# Patient Record
Sex: Female | Born: 1990 | Race: Black or African American | Hispanic: No | Marital: Single | Smoking: Never smoker
Health system: Southern US, Community
[De-identification: ages and names within clinical notes are randomized; demographics above are authoritative.]

## PROBLEM LIST (undated history)

## (undated) DIAGNOSIS — G800 Spastic quadriplegic cerebral palsy: Secondary | ICD-10-CM

## (undated) DIAGNOSIS — M24561 Contracture, right knee: Secondary | ICD-10-CM

## (undated) DIAGNOSIS — M24562 Contracture, left knee: Secondary | ICD-10-CM

## (undated) DIAGNOSIS — Z982 Presence of cerebrospinal fluid drainage device: Secondary | ICD-10-CM

## (undated) DIAGNOSIS — F819 Developmental disorder of scholastic skills, unspecified: Secondary | ICD-10-CM

## (undated) DIAGNOSIS — Q743 Arthrogryposis multiplex congenita: Secondary | ICD-10-CM

## (undated) DIAGNOSIS — R569 Unspecified convulsions: Secondary | ICD-10-CM

## (undated) DIAGNOSIS — Q751 Craniofacial dysostosis: Secondary | ICD-10-CM

## (undated) HISTORY — DX: Craniofacial dysostosis: Q75.1

## (undated) HISTORY — DX: Contracture, right knee: M24.561

## (undated) HISTORY — DX: Developmental disorder of scholastic skills, unspecified: F81.9

## (undated) HISTORY — DX: Unspecified convulsions: R56.9

## (undated) HISTORY — PX: GASTROSTOMY TUBE PLACEMENT: SHX655

## (undated) HISTORY — DX: Contracture, right knee: M24.562

## (undated) HISTORY — DX: Arthrogryposis multiplex congenita: Q74.3

## (undated) HISTORY — PX: ELBOW SURGERY: SHX618

## (undated) HISTORY — DX: Spastic quadriplegic cerebral palsy: G80.0

## (undated) HISTORY — PX: HIP SURGERY: SHX245

## (undated) HISTORY — DX: Presence of cerebrospinal fluid drainage device: Z98.2

---
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
## 2012-05-25 DIAGNOSIS — M4140 Neuromuscular scoliosis, site unspecified: Secondary | ICD-10-CM | POA: Insufficient documentation

## 2012-05-25 DIAGNOSIS — M419 Scoliosis, unspecified: Secondary | ICD-10-CM | POA: Insufficient documentation

## 2012-05-25 DIAGNOSIS — F79 Unspecified intellectual disabilities: Secondary | ICD-10-CM | POA: Insufficient documentation

## 2013-08-26 ENCOUNTER — Other Ambulatory Visit: Payer: Self-pay | Admitting: Family

## 2013-10-25 ENCOUNTER — Other Ambulatory Visit: Payer: Self-pay | Admitting: Family

## 2013-12-22 ENCOUNTER — Other Ambulatory Visit: Payer: Self-pay | Admitting: Family

## 2013-12-27 ENCOUNTER — Ambulatory Visit (INDEPENDENT_AMBULATORY_CARE_PROVIDER_SITE_OTHER): Payer: Medicaid Other | Admitting: Family

## 2013-12-27 ENCOUNTER — Encounter: Payer: Self-pay | Admitting: Family

## 2013-12-27 VITALS — BP 120/70 | HR 80

## 2013-12-27 DIAGNOSIS — Z79899 Other long term (current) drug therapy: Secondary | ICD-10-CM

## 2013-12-27 DIAGNOSIS — F819 Developmental disorder of scholastic skills, unspecified: Secondary | ICD-10-CM

## 2013-12-27 DIAGNOSIS — G40309 Generalized idiopathic epilepsy and epileptic syndromes, not intractable, without status epilepticus: Secondary | ICD-10-CM

## 2013-12-27 DIAGNOSIS — Q759 Congenital malformation of skull and face bones, unspecified: Secondary | ICD-10-CM

## 2013-12-27 DIAGNOSIS — Q688 Other specified congenital musculoskeletal deformities: Secondary | ICD-10-CM

## 2013-12-27 DIAGNOSIS — Q751 Craniofacial dysostosis: Secondary | ICD-10-CM

## 2013-12-27 DIAGNOSIS — M24569 Contracture, unspecified knee: Secondary | ICD-10-CM

## 2013-12-27 DIAGNOSIS — M24562 Contracture, left knee: Secondary | ICD-10-CM

## 2013-12-27 DIAGNOSIS — G808 Other cerebral palsy: Secondary | ICD-10-CM

## 2013-12-27 DIAGNOSIS — Z982 Presence of cerebrospinal fluid drainage device: Secondary | ICD-10-CM

## 2013-12-27 DIAGNOSIS — M24561 Contracture, right knee: Secondary | ICD-10-CM

## 2013-12-27 DIAGNOSIS — G800 Spastic quadriplegic cerebral palsy: Secondary | ICD-10-CM

## 2013-12-27 DIAGNOSIS — Q743 Arthrogryposis multiplex congenita: Secondary | ICD-10-CM

## 2013-12-27 DIAGNOSIS — R6250 Unspecified lack of expected normal physiological development in childhood: Secondary | ICD-10-CM

## 2013-12-27 NOTE — Progress Notes (Signed)
Patient: Alyssa Wong MRN: [id number] Sex: female DOB: Oct 08, 1991  Provider: [name], NP Location of Care: [facility]  Note type: Routine return visit  History of Present Illness: Referral Source: Dr. Adelfa Koh History from: Mother Chief Complaint: Seizures  Alyssa Wong is a 23 y.o. 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  Alyssa Wong was last seen December 25, 2012. Her mother reports that she has been healthy since last seen. She has had only rare brief seizures. Alyssa Wong is enrolled in [employer] 3 days per week and attends the [facility] [employer] 2 days per week. Her mother says that she does well at these programs and enjoys the activities.   Review of Systems: 12 system review was unremarkable  Past Medical History  Diagnosis Date  . Seizures   . Crouzon syndrome   . 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 Intellectual delay   . VP (ventriculoperitoneal) shunt status   . Arthrogryposis multiplex congenita   . Contractures involving both knees    Hospitalizations: yes, Head Injury: no, Nervous System Infections: no, Immunizations up to date: yes Past Medical History Comments:  Hospitalizations as a young child  Surgical History Past Surgical History  Procedure Laterality Date  . Elbow surgery Left     Release of contractures  . Gastrostomy tube placement    . Hip surgery      Family History family history includes Kidney disease in her maternal grandfather. Family History is negative migraines, seizures, cognitive impairment, blindness, deafness, birth defects, chromosomal disorder, autism.  Social History History   Social History  . Marital Status: Single    Spouse Name: N/A    Number of  Children: N/A  . Years of Education: N/A   Social History Main Topics  . Smoking status: Never Smoker   . Smokeless tobacco: Never Used  . Alcohol Use: No  . Drug Use: No  . Sexual Activity: No   Other Topics Concern  . None   Social History Narrative  . None   Educational level: special education Occupation: N/A   Living with mother and brother  Hobbies/Interest: Enjoys her tambourine and anything that makes noise. She has a Armed forces logistics/support/administrative officer that she has had since she was 23 years old and she still enjoys playing with it. School comments: Jianna graduated from [employer] in June 2013. She now attends [employer] 3 days a week and an adult daycare 2 days a week.   Current Outpatient Prescriptions on File Prior to Visit  Medication Sig Dispense Refill  . LAMICTAL 25 MG CHEW chewable tablet CHEW 1 TABLET BY MOUTH EVERY MORNING AND 2 TABLETS EVERY NIGHT AT BEDTIME.  90 tablet  0  . Valproic Acid (DEPAKENE) 250 MG/5ML SYRP syrup TAKE 1 TEASPOONFUL BY MOUTH TWICE DAILY  310 mL  0   No current facility-administered medications on file prior to visit.   The medication list was reviewed and reconciled. All changes or newly prescribed medications were explained.  A complete medication list was provided to the patient/caregiver.  No Known Allergies  Physical Exam BP 120/70  Pulse 80 General: Small statured young woman, seated in a wheelchair, in no evident distress Head: head  normocephalic and atraumatic.  She has dysmorphic facial features with exophthalmos, ocular hypertelorism, midface hypoplasia  Ears, Nose and Throat: I am unable to fully visualize her pharynx due to her inability to follow commands.  Neck: supple with no carotid or supraclavicular bruits.  She tends to keep her neck hyperextended and is unable to hold her head midline.  Respiratory: lungs clear to auscultation Cardiovascular: regular rate and rhythm, no murmurs Musculoskeletal:  quadriparesis with athrogryposis in the right upper extremities Skin: no rashes or lesions Trunk: she has g-tube button  Neurologic Exam  Mental Status: Awake and fully alert.  She has no language. She has evidence of intellectual delay. She is unable to follow commands. She was resistant to invasions into her space. Cranial Nerves: Fundoscopic exam reveals red reflex present. I was unable to assess fundi or disc margins.  She has disconjugate eye movements. I am unable to get her to fix and follow to assess extraocular movements. She seems to have full visual fields. She blinks to threat.  She was able to protrude her tongue.  Her neck was in exaggerated flexion and extension. Motor: quadriparesis with athrogryposis in the right upper extremities. She has very poor fine motor skills.  Sensory: Withdrawal x 4 Coordination: Unable to fully assess. She has poor coordination and a rake like grasp Gait and Station: Unable to stand or walk.  Reflexes: 1+ at the left patella, otherwise absent DTR's.  Plantar response neutral.  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 She has been healthy since last seen and has had only rare seizures. She will continue her medications without change and will return for follow up in 1 year or sooner if needed.

## 2013-12-29 ENCOUNTER — Encounter: Payer: Self-pay | Admitting: Family

## 2013-12-29 DIAGNOSIS — Q743 Arthrogryposis multiplex congenita: Secondary | ICD-10-CM | POA: Insufficient documentation

## 2013-12-29 DIAGNOSIS — G800 Spastic quadriplegic cerebral palsy: Secondary | ICD-10-CM | POA: Insufficient documentation

## 2013-12-29 DIAGNOSIS — Q751 Craniofacial dysostosis: Secondary | ICD-10-CM | POA: Insufficient documentation

## 2013-12-29 DIAGNOSIS — Z79899 Other long term (current) drug therapy: Secondary | ICD-10-CM | POA: Insufficient documentation

## 2013-12-29 DIAGNOSIS — Z982 Presence of cerebrospinal fluid drainage device: Secondary | ICD-10-CM | POA: Insufficient documentation

## 2013-12-29 DIAGNOSIS — F819 Developmental disorder of scholastic skills, unspecified: Secondary | ICD-10-CM | POA: Insufficient documentation

## 2013-12-29 DIAGNOSIS — G40309 Generalized idiopathic epilepsy and epileptic syndromes, not intractable, without status epilepticus: Secondary | ICD-10-CM | POA: Insufficient documentation

## 2013-12-29 DIAGNOSIS — M24562 Contracture, left knee: Secondary | ICD-10-CM

## 2013-12-29 DIAGNOSIS — M24561 Contracture, right knee: Secondary | ICD-10-CM | POA: Insufficient documentation

## 2013-12-29 NOTE — Patient Instructions (Signed)
Continue Alyssa Wong's medications without change. Let me know if she has any seizures, or if you have any questions or concerns.  Please plan to return for follow up in 1 year or sooner if needed.

## 2014-01-19 ENCOUNTER — Other Ambulatory Visit: Payer: Self-pay | Admitting: Family

## 2014-07-19 ENCOUNTER — Other Ambulatory Visit: Payer: Self-pay | Admitting: Family

## 2014-11-09 ENCOUNTER — Other Ambulatory Visit: Payer: Self-pay | Admitting: Family

## 2015-01-09 ENCOUNTER — Other Ambulatory Visit: Payer: Self-pay | Admitting: Family

## 2015-01-17 ENCOUNTER — Ambulatory Visit: Payer: Medicaid Other | Admitting: Family

## 2015-01-18 ENCOUNTER — Encounter: Payer: Self-pay | Admitting: Family

## 2015-01-18 ENCOUNTER — Ambulatory Visit (INDEPENDENT_AMBULATORY_CARE_PROVIDER_SITE_OTHER): Payer: Medicaid Other | Admitting: Family

## 2015-01-18 VITALS — BP 126/80 | HR 84

## 2015-01-18 DIAGNOSIS — Q743 Arthrogryposis multiplex congenita: Secondary | ICD-10-CM

## 2015-01-18 DIAGNOSIS — M24562 Contracture, left knee: Secondary | ICD-10-CM | POA: Diagnosis not present

## 2015-01-18 DIAGNOSIS — Q751 Craniofacial dysostosis: Secondary | ICD-10-CM | POA: Diagnosis not present

## 2015-01-18 DIAGNOSIS — M24561 Contracture, right knee: Secondary | ICD-10-CM | POA: Diagnosis not present

## 2015-01-18 DIAGNOSIS — G40309 Generalized idiopathic epilepsy and epileptic syndromes, not intractable, without status epilepticus: Secondary | ICD-10-CM | POA: Diagnosis not present

## 2015-01-18 DIAGNOSIS — F819 Developmental disorder of scholastic skills, unspecified: Secondary | ICD-10-CM

## 2015-01-18 DIAGNOSIS — Z982 Presence of cerebrospinal fluid drainage device: Secondary | ICD-10-CM | POA: Diagnosis not present

## 2015-01-18 DIAGNOSIS — G800 Spastic quadriplegic cerebral palsy: Secondary | ICD-10-CM

## 2015-01-18 MED ORDER — VALPROIC ACID 250 MG/5ML PO SYRP: ORAL_SOLUTION | ORAL | Status: DC

## 2015-01-18 MED ORDER — LAMOTRIGINE 25 MG PO CHEW: CHEWABLE_TABLET | ORAL | Status: DC

## 2015-01-18 NOTE — Patient Instructions (Signed)
Continue Mekhi's medication without change. Let me know if she has increase in the number or severity of her seizures. I have sent in a refill on her medications.  For the stye on her eye, try applying a warm, moist compress a few times per day for 5-10 minutes each time. You can also cleanse her eyelashes with some baby shampoo on a wet washcloth. If the stye does not improve or worsens, Montel ClockKierra will need to be seen by her PCP for further evaluation.   Remember to do gentle ROM as you perform her daily care. Let me know if her muscle tightness worsens or if Montel ClockKierra appears to be in pain.   I will see her back in follow up in 1 year or sooner if needed.

## 2015-01-18 NOTE — Progress Notes (Signed)
Patient: Alyssa Wong MRN: [id number] Sex: female DOB: 04/05/1991  Provider: Elveria RisingGOODPASTURE, Felicidad Sugarman, NP Location of Care: [facility]  Note type: Routine return visit  History of Present Illness: Referral Source: Dr. Adelfa KohShelley Kreiter History from: her mother Chief Complaint: Crouzon Syndrome/Seizures   Alyssa Wong is a 24 y.o. 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 Alyssa Alyssa Wong was last seen December 27, 2013. Her mother reports today that she has had 2 brief seizures since she was last seen. She says that Alyssa Alyssa Wong has been generally healthy but she is concerned today because she has had a stye on her left lower eyelid for the past few days.   Alyssa Alyssa Wong is enrolled in [employer] 3 days per week and attends the [facility] [employer] 2 days per week. Her mother says that she does well at these programs and enjoys the activities. Mom says that she sleeps well at night and that there are no problems with behavior. She feels that Alyssa Alyssa Wong is generally more spastic, especially in her upper body, and that she is sometimes more difficulty to dress.   Review of Systems: 12 system review was remarkable for seizures   Past Medical History  Diagnosis Date  . Seizures   . Crouzon syndrome   . 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 Intellectual delay   . VP (ventriculoperitoneal) shunt status   . Arthrogryposis multiplex congenita   . Contractures involving both knees    Hospitalizations: No., Head Injury: No., Nervous System Infections: No., Immunizations up to date: Yes.   Past Medical History Comments: see Hx  Surgical History Past Surgical History  Procedure Laterality Date  . Elbow surgery Left     Release of contractures  . Gastrostomy tube  placement    . Hip surgery      Family History family history includes Kidney disease in her maternal grandfather. Family History is otherwise negative for migraines, seizures, cognitive impairment, blindness, deafness, birth defects, chromosomal disorder, autism.  Social History History   Social History  . Marital Status: Single    Spouse Name: N/A  . Number of Children: N/A  . Years of Education: N/A   Social History Main Topics  . Smoking status: Never Smoker   . Smokeless tobacco: Never Used  . Alcohol Use: No  . Drug Use: No  . Sexual Activity: No   Other Topics Concern  . None   Social History Narrative   Educational level: special education School Attending: N/A Living with:  mother and siblings   Hobbies/Interest: Enjoys playing with her big bird stuffed animal and shaking her tambourine.  School comments:  Alyssa Alyssa Wong attends [employer] [facility].   Physical Exam BP 126/80 mmHg  Pulse 84  Ht   Wt   LMP  (Approximate) General: Small statured young woman, seated in a wheelchair, in no evident distress Head: headappears dolichocephalic and is atraumatic. She has dysmorphic facial features with exophthalmos, ocular hypertelorism, midface hypoplasia Ears, Nose and Throat: I am unable to fully visualize her pharynx due to her inability to follow commands.  Neck: supple with no carotid or supraclavicular bruits. She tends to keep her neck hyperextended and is unable to hold her head midline.  Respiratory: lungs clear to auscultation Cardiovascular: regular rate and rhythm, no murmurs Musculoskeletal: quadriparesis with athrogryposis in the right upper  extremities Skin: no rashes or lesions Trunk: she has g-tube button  Neurologic Exam  Mental Status: Awake and fully alert. She has no language. She has evidence of intellectual delay. She is unable to follow commands. She was resistant to invasions into her space. Cranial Nerves: Fundoscopic exam  reveals red reflex present. I was unable to assess fundi or disc margins. She has disconjugate eye movements. I am unable to get her to fix and follow to assess extraocular movements. She seems to have full visual fields. She blinks to threat. She was able to protrude her tongue. Her neck was in exaggerated flexion and extension. Motor: quadriparesis with athrogryposis in the right upper extremities. She has very poor fine motor skills.  Sensory: Withdrawal x 4 Coordination: Unable to fully assess. She has poor coordination and a rake like grasp Gait and Station: Unable to stand or walk.  Reflexes: 1+ at the left patella, otherwise absent DTR's. Plantar response neutral.  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 She is taking and tolerating Lamotrigine and Valproate for her seizure disorder and has had only 2 brief seizures since last seen. Alyssa Wong has a stye on her left lower eyelid today but has been otherwise generally healthy since last seen. I talked with her mother about the stye and recommended warm moist compresses several times per day for 5-10 minutes each time. She can also use baby shampoo to gently cleanse the eyelashes. I told her mother that if the stye did not improve or worsened that Alyssa Wong would need to be seen by her PCP. We also talked about the increase in spasticity. I recommended to her mother that she be consistent with gentle ROM exercises and to let me know if Alyssa Wong appears to be experiencing pain. We also talked about the fact that Alyssa Wong has not been able to bear weight, has been on long term seizure medications, and likely has fragile bones. 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   Alyssa Wong will continue her seizure medications without change and will return for follow up in 1 year or sooner if needed. Mom agreed with these plans.

## 2015-07-17 ENCOUNTER — Other Ambulatory Visit: Payer: Self-pay | Admitting: Family

## 2015-08-12 ENCOUNTER — Other Ambulatory Visit: Payer: Self-pay | Admitting: Family

## 2015-10-20 ENCOUNTER — Other Ambulatory Visit: Payer: Self-pay | Admitting: Family

## 2015-10-23 ENCOUNTER — Other Ambulatory Visit: Payer: Self-pay | Admitting: Family

## 2015-10-23 DIAGNOSIS — G40309 Generalized idiopathic epilepsy and epileptic syndromes, not intractable, without status epilepticus: Secondary | ICD-10-CM

## 2015-10-23 MED ORDER — DEPAKENE 250 MG/5ML PO SYRP: ORAL_SOLUTION | ORAL | Status: DC

## 2015-11-23 ENCOUNTER — Other Ambulatory Visit: Payer: Self-pay | Admitting: Family

## 2015-11-24 ENCOUNTER — Other Ambulatory Visit: Payer: Self-pay | Admitting: Family

## 2015-11-24 DIAGNOSIS — G40309 Generalized idiopathic epilepsy and epileptic syndromes, not intractable, without status epilepticus: Secondary | ICD-10-CM

## 2015-11-24 MED ORDER — DEPAKENE 250 MG/5ML PO SYRP: ORAL_SOLUTION | ORAL | Status: DC

## 2015-12-24 ENCOUNTER — Other Ambulatory Visit: Payer: Self-pay | Admitting: Family

## 2015-12-25 ENCOUNTER — Other Ambulatory Visit: Payer: Self-pay | Admitting: Family

## 2015-12-25 DIAGNOSIS — G40309 Generalized idiopathic epilepsy and epileptic syndromes, not intractable, without status epilepticus: Secondary | ICD-10-CM

## 2015-12-25 MED ORDER — DEPAKENE 250 MG/5ML PO SYRP: ORAL_SOLUTION | ORAL | Status: DC

## 2016-01-20 ENCOUNTER — Other Ambulatory Visit: Payer: Self-pay | Admitting: Pediatrics

## 2016-01-22 ENCOUNTER — Ambulatory Visit (INDEPENDENT_AMBULATORY_CARE_PROVIDER_SITE_OTHER): Payer: Medicaid Other | Admitting: Family

## 2016-01-22 ENCOUNTER — Encounter: Payer: Self-pay | Admitting: Family

## 2016-01-22 VITALS — BP 120/84 | HR 86 | Ht 59.0 in | Wt 103.0 lb

## 2016-01-22 DIAGNOSIS — G800 Spastic quadriplegic cerebral palsy: Secondary | ICD-10-CM | POA: Diagnosis not present

## 2016-01-22 DIAGNOSIS — M24561 Contracture, right knee: Secondary | ICD-10-CM

## 2016-01-22 DIAGNOSIS — M24562 Contracture, left knee: Secondary | ICD-10-CM

## 2016-01-22 DIAGNOSIS — G40309 Generalized idiopathic epilepsy and epileptic syndromes, not intractable, without status epilepticus: Secondary | ICD-10-CM

## 2016-01-22 DIAGNOSIS — Q743 Arthrogryposis multiplex congenita: Secondary | ICD-10-CM | POA: Diagnosis not present

## 2016-01-22 DIAGNOSIS — F819 Developmental disorder of scholastic skills, unspecified: Secondary | ICD-10-CM | POA: Diagnosis not present

## 2016-01-22 DIAGNOSIS — Q751 Craniofacial dysostosis: Secondary | ICD-10-CM

## 2016-01-22 DIAGNOSIS — Z982 Presence of cerebrospinal fluid drainage device: Secondary | ICD-10-CM

## 2016-01-22 MED ORDER — DEPAKENE 250 MG/5ML PO SYRP: ORAL_SOLUTION | ORAL | Status: DC

## 2016-01-22 MED ORDER — LAMOTRIGINE 25 MG PO CHEW: CHEWABLE_TABLET | ORAL | Status: DC

## 2016-01-22 NOTE — Progress Notes (Signed)
Patient: Alyssa Wong MRN: [id number] Sex: female DOB: 11/07/1991  Provider: [name], NP Location of Care: [facility]  Note type: Routine return visit  History of Present Illness: Referral Source: Dr. Adelfa Koh History from: mother, patient and [facility] chart Chief Complaint: Crouzon Syndrome/Seizures  Alyssa Wong is a 25 y.o. 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 Alyssa Wong was last seen January 18, 2015. Her mother reports today that she has had a few brief seizures since she was last seen.   Alyssa Wong is enrolled in [facility] [employer] 3 days per week and [facility]ends the [facility] per [facility]. Her mother says that she does well at these programs and enjoys the activities. Mom says that she sleeps well at night and that there are no problems with behavior. She feels that Alyssa Wong has been generally healthy since she was last seen and has no health concerns for her today other than previously noted.   Review of Systems: Please see the HPI for neurologic and other pertinent review of systems. Otherwise, the following systems are noncontributory including constitutional, eyes, ears, nose and throat, cardiovascular, respiratory, gastrointestinal, genitourinary, musculoskeletal, skin, endocrine, hematologic/lymph, allergic/immunologic and psychiatric.   Past Medical History  Diagnosis Date  . Seizures (HCC)   . Crouzon syndrome   . 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 Intellectual delay   . VP (ventriculoperitoneal) shunt status   . Arthrogryposis multiplex congenita   . Contractures involving both knees    Hospitalizations: No., Head Injury: No., Nervous System Infections: No., Immunizations up to date: Yes.   Past  Medical History Comments: See history  Surgical History Past Surgical History  Procedure Laterality Date  . Elbow surgery Left     Release of contractures  . Gastrostomy tube placement    . Hip surgery      Family History family history includes Kidney disease in her maternal grandfather. Family History is otherwise negative for migraines, seizures, cognitive impairment, blindness, deafness, birth defects, chromosomal disorder, autism.  Social History Social History   Social History  . Marital Status: Single    Spouse Name: N/A  . Number of Children: N/A  . Years of Education: N/A   Social History Main Topics  . Smoking status: Never Smoker   . Smokeless tobacco: Never Used  . Alcohol Use: No  . Drug Use: No  . Sexual Activity: No   Other Topics Concern  . None   Social History Narrative   Alyssa Wong is a young woman who graduated from [employer] with a certificate in 2013. She is enrolled in the day programs [employer] and [facility] [employer]. She lives with her mother and she has 3 siblings, 41, 55, and 30    Allergies No Known Allergies  Physical Exam BP 120/84 mmHg  Pulse 86  Ht  (1.499 m)  Wt 103 lb (46.72 kg)  BMI 20.79 kg/m2  LMP 01/12/2016 (Exact Date)   General: Small statured young woman, seated in a wheelchair, in no evident distress Head: headappears dolichocephalic and is atraumatic. She has dysmorphic facial features with exophthalmos, ocular hypertelorism, midface hypoplasia Ears, Nose and Throat: I am unable to fully visualize her pharynx due to her inability to follow commands.  Neck: supple with no carotid or supraclavicular bruits. She tends to keep  her neck hyperextended and is unable to hold her head midline.  Respiratory: lungs clear to auscultation Cardiovascular: regular rate and rhythm, no murmurs Musculoskeletal: quadriparesis with athrogryposis in the right upper extremities Skin: no rashes or lesions Trunk: she  has g-tube button  Neurologic Exam  Mental Status: Awake and fully alert. She has no language. She has evidence of intellectual delay. She is unable to follow commands. She was resistant to invasions into her space. Cranial Nerves: Fundoscopic exam reveals red reflex present. I was unable to assess fundi or disc margins. She has disconjugate eye movements. I am unable to get her to fix and follow to assess extraocular movements. She seems to have full visual fields. She blinks to threat. She was able to protrude her tongue. Her neck was in exaggerated flexion and extension. Motor: quadriparesis with athrogryposis in the right upper extremities. She has very poor fine motor skills.  Sensory: Withdrawal x 4 Coordination: Unable to fully assess. She has poor coordination and a rake like grasp Gait and Station: Unable to stand or walk.  Reflexes: 1+ at the left patella, otherwise absent DTR's. Plantar response neutral.  Impression 1. Crouzon syndrome 2. Generalized convulsive epilepsy 3. Spastic quadriparesis 4. Contractures involving multiple joints 5. Arthrogryposis multiplex congenita 6. Significant developmental delay 7. History of VP shunt  Recommendations for plan of care The patient's previous [facility] records were reviewed. Alyssa Wong has neither had nor required imaging or lab studies since the last visit. 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 She is taking and tolerating Lamotrigine and Valproate for her seizure disorder and has had only a few brief breakthrough seizures in the past year. Mom is satisfied with her seizure control at this time. I talked with Mom about applying for guardianship for Alyssa Wong and told her that I will be happy to write a letter to  support that if she chose to do so. Alyssa Wong will continue her seizure medications without change and will return for follow up in 1 year or sooner if needed. I asked Mom to call in the interim if she has any questions or concerns. Mom agreed with plans made today.  The medication list was reviewed and reconciled.  No changes were made in the prescribed medications today.  A complete medication list was provided to the patient's mother.  Total time spent with the patient was 20 minutes, of which 50% or more was spent in counseling and coordination of care.

## 2016-01-23 ENCOUNTER — Encounter: Payer: Self-pay | Admitting: Family

## 2016-01-23 NOTE — Patient Instructions (Addendum)
Continue giving Alyssa Wong's medications as you have been giving them. Let me know if she has any increase in seizure activity or if you have any other concerns.   Call me if you need a letter of support to apply for guardianship for [facility].  Consider signing up for MyChart as Alyssa Wong's proxy.   Please plan to return for follow up in 1 year or sooner if needed.

## 2016-07-30 ENCOUNTER — Other Ambulatory Visit: Payer: Self-pay | Admitting: Family

## 2016-07-30 DIAGNOSIS — G40309 Generalized idiopathic epilepsy and epileptic syndromes, not intractable, without status epilepticus: Secondary | ICD-10-CM

## 2016-09-26 ENCOUNTER — Other Ambulatory Visit: Payer: Self-pay | Admitting: Family

## 2016-09-26 DIAGNOSIS — G40309 Generalized idiopathic epilepsy and epileptic syndromes, not intractable, without status epilepticus: Secondary | ICD-10-CM

## 2016-09-27 ENCOUNTER — Other Ambulatory Visit (INDEPENDENT_AMBULATORY_CARE_PROVIDER_SITE_OTHER): Payer: Self-pay | Admitting: Family

## 2016-09-27 DIAGNOSIS — G40309 Generalized idiopathic epilepsy and epileptic syndromes, not intractable, without status epilepticus: Secondary | ICD-10-CM

## 2016-09-27 MED ORDER — DEPAKENE 250 MG/5ML PO SOLN: ORAL | 3 refills | Status: DC

## 2016-09-27 MED ORDER — LAMOTRIGINE 25 MG PO CHEW: CHEWABLE_TABLET | ORAL | 3 refills | Status: DC

## 2016-12-11 ENCOUNTER — Other Ambulatory Visit: Payer: Self-pay | Admitting: Family

## 2016-12-11 DIAGNOSIS — G40309 Generalized idiopathic epilepsy and epileptic syndromes, not intractable, without status epilepticus: Secondary | ICD-10-CM

## 2017-02-17 ENCOUNTER — Encounter (INDEPENDENT_AMBULATORY_CARE_PROVIDER_SITE_OTHER): Payer: Self-pay | Admitting: *Deleted

## 2017-03-04 ENCOUNTER — Encounter (INDEPENDENT_AMBULATORY_CARE_PROVIDER_SITE_OTHER): Payer: Self-pay | Admitting: Family

## 2017-03-04 ENCOUNTER — Ambulatory Visit (INDEPENDENT_AMBULATORY_CARE_PROVIDER_SITE_OTHER): Payer: Medicaid Other | Admitting: Family

## 2017-03-04 VITALS — BP 120/76 | HR 84

## 2017-03-04 DIAGNOSIS — G40309 Generalized idiopathic epilepsy and epileptic syndromes, not intractable, without status epilepticus: Secondary | ICD-10-CM | POA: Diagnosis not present

## 2017-03-04 DIAGNOSIS — K117 Disturbances of salivary secretion: Secondary | ICD-10-CM | POA: Diagnosis not present

## 2017-03-04 DIAGNOSIS — G800 Spastic quadriplegic cerebral palsy: Secondary | ICD-10-CM | POA: Diagnosis not present

## 2017-03-04 MED ORDER — DEPAKENE 250 MG/5ML PO SOLN: ORAL | 5 refills | Status: DC

## 2017-03-04 MED ORDER — LAMOTRIGINE 25 MG PO CHEW: CHEWABLE_TABLET | ORAL | 5 refills | Status: DC

## 2017-03-04 MED ORDER — GLYCOPYRROLATE 2 MG PO TABS: ORAL_TABLET | ORAL | 5 refills | Status: DC

## 2017-03-04 NOTE — Progress Notes (Signed)
Patient: Alyssa Wong MRN: [id number] Sex: female DOB: [birth year]  Provider: [name], NP Location of Care: [facility]  Note type: Routine return visit  History of Present Illness: Referral Source: Dr [name] History from: [facility] chart and her mother Chief Complaint: Crouzon syndrome and seizures  Alyssa Wong is a 26 y.o. 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 Alyssa Wong was last seen January 22, 2016. Her mother reports today that she has had a few brief seizures since she was last seen, with the last being February 06, 2017. Mom says that the seizures are brief, lasting 2 minutes or less. She says that Alyssa Wong now "hollers out" prior to the seizure, and this is new for her. In the past, she used to hear raspy breathing prior the onset of the seizure.   Alyssa Wong is enrolled in [facility] 3 days per week and attends [facility] per [facility]. Her mother says that she does well at these programs and enjoys the activities. Mom says that she sleeps well at night and that there are no problems with behavior. She feels that Alyssa Wong has been generally healthy since she was last seen and has no health concerns for her today other than previously noted.   Review of Systems: Please see the HPI for neurologic and other pertinent review of systems. Otherwise, the following systems are noncontributory including constitutional, eyes, ears, nose and throat, cardiovascular, respiratory, gastrointestinal, genitourinary, musculoskeletal, skin, endocrine, hematologic/lymph, allergic/immunologic and psychiatric.   Past Medical History:  Diagnosis Date  . Arthrogryposis multiplex congenita   . Contractures involving both knees   . Crouzon syndrome   .  Intellectual delay   . Seizures (HCC)   . 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 VP (ventriculoperitoneal) shunt status    Hospitalizations: No., Head Injury: No., Nervous System Infections: No., Immunizations up to date: Yes.   Past Medical History Comments: See history  Surgical History Past Surgical History:  Procedure Laterality Date  . ELBOW SURGERY Left    Release of contractures  . GASTROSTOMY TUBE PLACEMENT    . HIP SURGERY      Family History family history includes Kidney disease in her maternal grandfather. Family History is otherwise negative for migraines, seizures, cognitive impairment, blindness, deafness, birth defects, chromosomal disorder, autism.  Social History Social History   Social History  . Marital status: Single    Spouse name: N/A  . Number of children: N/A  . Years of education: N/A   Social History Main Topics  . Smoking status: Never Smoker  . Smokeless tobacco: Never Used  . Alcohol use No  . Drug use: No  . Sexual activity: No   Other Topics Concern  . None   Social History Narrative   Ceriah is a young woman who graduated from [employer] with a certificate in 2013. She is enrolled in the day programs [facility] and [facility] [employer]. She lives with her mother and she has 3 siblings,  83 yo brother at home    Allergies No Known Allergies  Physical Exam BP 120/76   Pulse 84  General: Small statured young woman, seated in a wheelchair, in no evident distress Head: headappears dolichocephalic and is atraumatic. She has dysmorphic facial features with exophthalmos, ocular hypertelorism, midface hypoplasia Ears, Nose  and Throat: I am unable to fully visualize her pharynx due to her inability to follow commands.  Neck: supple with no carotid or supraclavicular bruits. She tends to keep her neck hyperextended and is unable to hold her head midline.  Respiratory: lungs clear to  auscultation Cardiovascular: regular rate and rhythm, no murmurs Musculoskeletal: quadriparesis with athrogryposis in the right upper extremities Skin: no rashes or lesions Trunk: she has g-tube button  Neurologic Exam  Mental Status: Awake and fully alert. She has no language. She has evidence of intellectual delay. She is unable to follow commands. She was resistant to invasions into her space. Cranial Nerves: Fundoscopic exam reveals red reflex present. I was unable to assess fundi or disc margins. She has disconjugate eye movements. I am unable to get her to fix and follow to assess extraocular movements. She seems to have full visual fields. She blinks to threat. She was able to protrude her tongue. Her neck was in exaggerated flexion and extension. Motor: quadriparesis with athrogryposis in the right upper extremities. She has very poor fine motor skills.  Sensory: Withdrawal x 4 Coordination: Unable to fully assess. She has poor coordination and a rake like grasp Gait and Station: Unable to stand or walk.  Reflexes: 1+ at the left patella, otherwise absent DTR's. Plantar response neutral.  Impression 1. Crouzon syndrome 2. Generalized convulsive epilepsy 3. Spastic quadriparesis 4. Contractures involving multiple joints 5. Arthrogryposis multiplex congenita 6. Significant developmental delay 7. History of VP shunt  Recommendations for plan of care The patient's previous [facility] records were reviewed. Alyssa Wong has neither had nor required imaging or lab studies since the last visit. 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 She is taking and tolerating Lamotrigine and Valproate for her seizure disorder and has had only a few brief breakthrough seizures in  the past year. Mom is satisfied with her seizure control at this time. I talked with Mom about the change in the seizure behavior and asked her to let me know if the seizures become more frequent or last longer. We will need to perform an EEG if these things occur. Alyssa Wong will continue her seizure medications without change for now and will return for follow up in 1 year or sooner if needed. I asked Mom to call in the interim if she has any questions or concerns. Mom agreed with plans made today.  The medication list was reviewed and reconciled.  No changes were made in the prescribed medications today.  A complete medication list was provided to the patient's mother.   Allergies as of 03/04/2017   No Known Allergies     Medication List       Accurate as of 03/04/17 11:59 PM. Always use your most recent med list.          DEPAKENE 250 MG/5ML Soln solution Generic drug:  Valproate Sodium TAKE 5 MLS BY MOUTH TWICE DAILY   glycopyrrolate 2 MG tablet Commonly known as:  ROBINUL TAKE 1/2 TO 1 TABLET BY MOUTH THREE TIMES DAILY AS NEEDED FOR DROOLING   lactulose 10 GM/15ML solution Commonly known as:  CHRONULAC TAKE 1 TEASPOONFUL(5 ML) BY MOUTH TWICE DAILY AS NEEDED FOR CONSTIPATION   lamoTRIgine 25 MG Chew chewable tablet Commonly known as:  LAMICTAL CHEW AND SWALLOW 1 TABLET BY MOUTH EVERY MORNING AND 2  TABLETS AT BEDTIME.   loratadine 10 MG tablet Commonly known as:  CLARITIN 1 tab per G-tube daily   loratadine-pseudoephedrine 10-240 MG 24 hr tablet Commonly known as:  CLARITIN-D 24-hour TAKE 1 TABLET BY MOUTH EVERY DAY   norgestrel-ethinyl estradiol 0.3-30 MG-MCG tablet Commonly known as:  LO/OVRAL,CRYSELLE TAKE 1 TABLET BY MOUTH EVERY DAY AS DIRECTED       Total time spent with the patient was 20 minutes, of which 50% or more was spent in counseling and coordination of care.   [name] NP-C

## 2017-03-04 NOTE — Progress Notes (Signed)
   4. Has the seizure changed in appearance? NO but does make sounds with them   5. Has the number of seizures increased? Same last one was March 15   6. What was the child doing when the seizure occurred? ( Tv, video games, stooling, eating, in pain or another source of stress) Morning just before getting up   11. How long did the seizure last? 2-1min   Does need refills on medications today

## 2017-03-05 NOTE — Patient Instructions (Signed)
Continue Alyssa Wong's medications as you have been giving them. Let me know if her seizures become more frequent, harder or last longer.   Please plan to return for follow up in 1 year or sooner if needed.

## 2017-03-26 ENCOUNTER — Other Ambulatory Visit: Payer: Self-pay | Admitting: Family

## 2017-03-26 DIAGNOSIS — G40309 Generalized idiopathic epilepsy and epileptic syndromes, not intractable, without status epilepticus: Secondary | ICD-10-CM

## 2017-10-07 ENCOUNTER — Other Ambulatory Visit (INDEPENDENT_AMBULATORY_CARE_PROVIDER_SITE_OTHER): Payer: Self-pay | Admitting: Family

## 2017-10-07 DIAGNOSIS — G40309 Generalized idiopathic epilepsy and epileptic syndromes, not intractable, without status epilepticus: Secondary | ICD-10-CM

## 2017-10-20 ENCOUNTER — Other Ambulatory Visit (INDEPENDENT_AMBULATORY_CARE_PROVIDER_SITE_OTHER): Payer: Self-pay | Admitting: Family

## 2017-10-20 DIAGNOSIS — G40309 Generalized idiopathic epilepsy and epileptic syndromes, not intractable, without status epilepticus: Secondary | ICD-10-CM

## 2017-11-10 ENCOUNTER — Other Ambulatory Visit (INDEPENDENT_AMBULATORY_CARE_PROVIDER_SITE_OTHER): Payer: Self-pay | Admitting: Family

## 2017-11-10 DIAGNOSIS — G40309 Generalized idiopathic epilepsy and epileptic syndromes, not intractable, without status epilepticus: Secondary | ICD-10-CM

## 2017-11-20 ENCOUNTER — Other Ambulatory Visit (INDEPENDENT_AMBULATORY_CARE_PROVIDER_SITE_OTHER): Payer: Self-pay | Admitting: Family

## 2017-11-20 DIAGNOSIS — G40309 Generalized idiopathic epilepsy and epileptic syndromes, not intractable, without status epilepticus: Secondary | ICD-10-CM

## 2017-12-14 ENCOUNTER — Other Ambulatory Visit (INDEPENDENT_AMBULATORY_CARE_PROVIDER_SITE_OTHER): Payer: Self-pay | Admitting: Family

## 2017-12-14 ENCOUNTER — Other Ambulatory Visit: Payer: Self-pay | Admitting: Neurology

## 2017-12-14 DIAGNOSIS — G40309 Generalized idiopathic epilepsy and epileptic syndromes, not intractable, without status epilepticus: Secondary | ICD-10-CM

## 2017-12-21 ENCOUNTER — Other Ambulatory Visit (INDEPENDENT_AMBULATORY_CARE_PROVIDER_SITE_OTHER): Payer: Self-pay | Admitting: Family

## 2017-12-21 DIAGNOSIS — K117 Disturbances of salivary secretion: Secondary | ICD-10-CM

## 2017-12-21 DIAGNOSIS — G800 Spastic quadriplegic cerebral palsy: Secondary | ICD-10-CM

## 2018-01-08 ENCOUNTER — Other Ambulatory Visit (INDEPENDENT_AMBULATORY_CARE_PROVIDER_SITE_OTHER): Payer: Self-pay | Admitting: Family

## 2018-01-08 DIAGNOSIS — G40309 Generalized idiopathic epilepsy and epileptic syndromes, not intractable, without status epilepticus: Secondary | ICD-10-CM

## 2018-01-12 ENCOUNTER — Other Ambulatory Visit: Payer: Self-pay | Admitting: Family

## 2018-01-12 DIAGNOSIS — G40309 Generalized idiopathic epilepsy and epileptic syndromes, not intractable, without status epilepticus: Secondary | ICD-10-CM

## 2018-01-20 ENCOUNTER — Other Ambulatory Visit: Payer: Self-pay | Admitting: Family

## 2018-01-20 DIAGNOSIS — G800 Spastic quadriplegic cerebral palsy: Secondary | ICD-10-CM

## 2018-01-20 DIAGNOSIS — G40309 Generalized idiopathic epilepsy and epileptic syndromes, not intractable, without status epilepticus: Secondary | ICD-10-CM

## 2018-01-20 DIAGNOSIS — K117 Disturbances of salivary secretion: Secondary | ICD-10-CM

## 2018-06-23 ENCOUNTER — Other Ambulatory Visit: Payer: Self-pay | Admitting: Family

## 2018-06-23 DIAGNOSIS — G800 Spastic quadriplegic cerebral palsy: Secondary | ICD-10-CM

## 2018-06-23 DIAGNOSIS — K117 Disturbances of salivary secretion: Secondary | ICD-10-CM

## 2018-06-24 ENCOUNTER — Encounter (INDEPENDENT_AMBULATORY_CARE_PROVIDER_SITE_OTHER): Payer: Self-pay | Admitting: Family

## 2018-07-16 ENCOUNTER — Encounter (INDEPENDENT_AMBULATORY_CARE_PROVIDER_SITE_OTHER): Payer: Self-pay | Admitting: Family

## 2018-07-16 ENCOUNTER — Ambulatory Visit (INDEPENDENT_AMBULATORY_CARE_PROVIDER_SITE_OTHER): Payer: Medicaid Other | Admitting: Family

## 2018-07-16 VITALS — BP 104/68 | HR 80 | Ht 59.0 in | Wt 94.0 lb

## 2018-07-16 DIAGNOSIS — Q743 Arthrogryposis multiplex congenita: Secondary | ICD-10-CM

## 2018-07-16 DIAGNOSIS — K117 Disturbances of salivary secretion: Secondary | ICD-10-CM | POA: Diagnosis not present

## 2018-07-16 DIAGNOSIS — Q751 Craniofacial dysostosis: Secondary | ICD-10-CM

## 2018-07-16 DIAGNOSIS — G40309 Generalized idiopathic epilepsy and epileptic syndromes, not intractable, without status epilepticus: Secondary | ICD-10-CM | POA: Diagnosis not present

## 2018-07-16 DIAGNOSIS — Z982 Presence of cerebrospinal fluid drainage device: Secondary | ICD-10-CM

## 2018-07-16 DIAGNOSIS — Z931 Gastrostomy status: Secondary | ICD-10-CM

## 2018-07-16 DIAGNOSIS — R634 Abnormal weight loss: Secondary | ICD-10-CM

## 2018-07-16 DIAGNOSIS — M24562 Contracture, left knee: Secondary | ICD-10-CM

## 2018-07-16 DIAGNOSIS — M24561 Contracture, right knee: Secondary | ICD-10-CM

## 2018-07-16 DIAGNOSIS — G800 Spastic quadriplegic cerebral palsy: Secondary | ICD-10-CM | POA: Diagnosis not present

## 2018-07-16 DIAGNOSIS — F819 Developmental disorder of scholastic skills, unspecified: Secondary | ICD-10-CM

## 2018-07-16 NOTE — Progress Notes (Signed)
Patient: Alyssa Wong MRN: [id number] Sex: female DOB: 10-25-1991  Provider: [name], NP Location of Care: [facility]  Note type: Routine return visit  History of Present Illness: Referral Source: Alyssa Able, MD History from: mother and Alyssa Wong chart Chief Complaint: Crouzon Syndrome and Seizures  Alyssa Wong is a 27 y.o. 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 She was last seen March 04, 2017.  Her mother reports today that she has had only a few seizures, with the last being in July. The seizures are brief and do not require intervention. Alyssa Wong is taking and tolerating Lamotrigine and Valproate for her seizure disorder.  Alyssa Wong was weighed today and Mom feels that she has lost about 10 lbs since the last time she was weighed. Mom says that [name]'s diet is Osmolyte every 2-3 hours while awake, using an average of 6 cans per day, and that she has tolerated this for some time.   Alyssa Wong is enrolled in [facility] 3 days per week and attends [facility] per [facility]. Mom says that she enjoys the activities at these programs.   Alyssa Wong has a poorly fitting wheelchair. She is not secure in the chair and is unable to sit comfortably. Mom is unable to recall when this wheelchair was issued but believes it to be quite old.   Alyssa Wong sleeps well at night and has been generally healthy since she was last seen. Mom has no other health concerns for Alyssa Wong today other than previously mentioned.  Review of Systems: Please see the HPI for neurologic and other pertinent review of systems. Otherwise, all other systems were reviewed and were negative.   Past Medical History:  Diagnosis Date  . Arthrogryposis multiplex congenita   . Contractures involving both knees     . Crouzon syndrome   . Intellectual delay   . Seizures (HCC)   . 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 VP (ventriculoperitoneal) shunt status    Hospitalizations: No., Head Injury: No., Nervous System Infections: No., Immunizations up to date: Yes.   Past Medical History Comments: See HPI  Surgical History Past Surgical History:  Procedure Laterality Date  . ELBOW SURGERY Left    Release of contractures  . GASTROSTOMY TUBE PLACEMENT    . HIP SURGERY      Family History family history includes Kidney disease in her maternal grandfather. Family History is otherwise negative for migraines, seizures, cognitive impairment, blindness, deafness, birth defects, chromosomal disorder, autism.  Social History Social History   Socioeconomic History  . Marital status: Single    Spouse name: Not on file  . Number of children: Not on file  . Years of education: Not on file  . Highest education level: Not on file  Occupational History  . Not on file  Social Needs  . Financial resource strain: Not on file  . Food insecurity:    Worry: Not on file    Inability: Not on file  . Transportation needs:    Medical: Not on file    Non-medical: Not on file  Tobacco Use  . Smoking status: Never Smoker  . Smokeless tobacco: Never Used  Substance and Sexual Activity  . Alcohol use: No  . Drug use: No  . Sexual activity: Never  Lifestyle  . Physical activity:  Days per week: Not on file    Minutes per session: Not on file  . Stress: Not on file  Relationships  . Social connections:    Talks on phone: Not on file    Gets together: Not on file    Attends religious service: Not on file    Active member of club or organization: Not on file    Attends meetings of clubs or organizations: Not on file    Relationship status: Not on file  Other Topics Concern  . Not on file  Social History Narrative   Alyssa Wong is a young woman who graduated from [employer] with a  certificate in 2013. She is enrolled in the day programs [facility] and [facility] [employer]. She lives with her mother and she has 3 siblings,  55 yo brother at home    Allergies No Known Allergies  Physical Exam BP 104/68   Pulse 80   Ht 4\' 11"  (1.499 m) Comment: reported  Wt 94 lb (42.6 kg)   BMI 18.99 kg/m  General: small statured woman, seated in wheelchair, in no evident distress; black hair, brown eyes, even handed Head: dolichocephalic and atraumatic. Oropharynx benign but I am unable to fully visualize her pharynx due to her inability to fully open her mouth for examination. She has dysmorphic features with exophthalmos, ocular hypertelorism, midface hypoplasia Neck: she keeps her neck hyperextended and is unable to hold her head midline Cardiovascular: regular rate and rhythm, no murmurs. Respiratory: Clear to auscultation bilaterally Abdomen: Bowel sounds present all four quadrants, abdomen soft, non-tender, non-distended. No hepatosplenomegaly or masses palpated. G-tube button in place. Musculoskeletal: Spastic quadriparesis with arthrogryposis in the right upper extremity. Her back is arched and does not touch the back of her wheelchair. Skin: no rashes or neurocutaneous lesions  Neurologic Exam Mental Status: Awake and fully alert. Has no language.  Smiles responsively at times. Unable to follow commands or participate in examination. Resistant to invasions in to her space Cranial Nerves: Fundoscopic exam - red reflex present.  Unable to fully visualize fundus. She has dysconjugate eye movements. I am unable to get her to fix and follow on faces or objects. She blinks to threat. She is Wong to protrude her tongue but not on command. She has lower facial weakness with mild drooling. Her neck is in an exaggerated flexion and extension.  Motor: Spastic quadriparesis with arthrogryposis in the right upper extremity. She has poor fine motor skills Sensory: Withdrawal x  4 Coordination: Unable to adequately assess due to patient's inability to participate in examination. Does not reach for objects. Gait and Station: Unable to stand and bear weight.  Reflexes: 1+ at the left patella otherwise absent DTR's. Toes neutral. No clonus.  Impression 1.  Crouson syndrome 2.  Generalized convulsive epilepsy 3.  Spastic quadriparesis 4.  Contractures involving multiple joints 5.  Arthrogryposis multiplex congenita 6.  Significant developmental delay 7.  History of VP shunt  Recommendations for plan of care The patient's previous [facility] records were reviewed. Alyssa Wong has neither had nor required imaging or lab studies since the last visit. She is a 27 year old woman with history of Crouzon syndrome, generalized convulsive epilepsy, spastic quadriparesis, contractures involving multiple joints, arthrogryposis multiplex congenita, significant developmental delay and history of VP shunt. She is taking and tolerating Lamotrigine and Valproate for her seizure disorder. Her seizures are brief and infrequent, and I will make no change in her treatment plan at this time. I am  concerned about her comfort and safety in her poorly fitting wheelchair and will refer her for physical therapy evaluation for that. I am also concerned about her weight loss and will see her back in follow up for a weight check in 4 months. I will also refer her to the dietician at this office for evaluation of her feedings. Mom agreed with the plans made today.   The medication list was reviewed and reconciled.  No changes were made in the prescribed medications today.  A complete medication list was provided to the patient's mother.  Allergies as of 07/16/2018   No Known Allergies     Medication List        Accurate as of 07/16/18 11:59 PM. Always use your most recent med list.          DEPAKENE 250 MG/5ML Soln solution Generic drug:  Valproate Sodium TAKE 5 ML BY MOUTH TWICE DAILY   glycopyrrolate  2 MG tablet Commonly known as:  ROBINUL TAKE 1/2 TO 1 TABLET BY MOUTH THREE TIMES DAILY AS NEEDED FOR DROOLING   lactulose 10 GM/15ML solution Commonly known as:  CHRONULAC TAKE 1 TEASPOONFUL(5 ML) BY MOUTH TWICE DAILY AS NEEDED FOR CONSTIPATION   lamoTRIgine 25 MG Chew chewable tablet Commonly known as:  LAMICTAL CHEW AND SWALLOW 1 TABLET BY MOUTH EVERY MORNING AND 2 TABLETS AT BEDTIME.   loratadine 10 MG tablet Commonly known as:  CLARITIN 1 tab per G-tube daily   loratadine-pseudoephedrine 10-240 MG 24 hr tablet Commonly known as:  CLARITIN-D 24-hour TAKE 1 TABLET BY MOUTH EVERY DAY   norgestrel-ethinyl estradiol 0.3-30 MG-MCG tablet Commonly known as:  LO/OVRAL,CRYSELLE TAKE 1 TABLET BY MOUTH EVERY DAY AS DIRECTED       Dr. Sharene SkeansHickling was consulted regarding the patient.   Total time spent with the patient was 25 minutes, of which 50% or more was spent in counseling and coordination of care.   Alyssa Risingina Skylynne Schlechter NP-C

## 2018-07-17 ENCOUNTER — Encounter (INDEPENDENT_AMBULATORY_CARE_PROVIDER_SITE_OTHER): Payer: Self-pay | Admitting: Family

## 2018-07-17 DIAGNOSIS — Z931 Gastrostomy status: Secondary | ICD-10-CM | POA: Insufficient documentation

## 2018-07-17 DIAGNOSIS — R634 Abnormal weight loss: Secondary | ICD-10-CM | POA: Insufficient documentation

## 2018-07-17 MED ORDER — DEPAKENE 250 MG/5ML PO SOLN: ORAL | 5 refills | Status: DC

## 2018-07-17 MED ORDER — GLYCOPYRROLATE 2 MG PO TABS: ORAL_TABLET | ORAL | 5 refills | Status: DC

## 2018-07-17 MED ORDER — LAMOTRIGINE 25 MG PO CHEW: CHEWABLE_TABLET | ORAL | 5 refills | Status: DC

## 2018-07-17 NOTE — Patient Instructions (Signed)
Thank you for coming in today.   Instructions for you until your next appointment are as follows: 1. Continue Alyssa Wong's medications as you have been giving them.  2. Let me know if Alyssa Wong's seizures increase in frequency or severity 3. I am concerned about her weight loss. I would like to see Alyssa Wong again in about 4 months, and we will recheck her weight at that time.  4.  I will also refer Alyssa Wong for evaluation by the dietician at this office when she returns in 4 months 5. I am concerned about Alyssa Wong's safety and comfort in her wheelchair. I will refer her to physical therapy for evaluation for a better fitting chair 6.  Please sign up for MyChart if you have not done so

## 2018-07-27 ENCOUNTER — Other Ambulatory Visit: Payer: Self-pay | Admitting: Family

## 2018-07-27 DIAGNOSIS — K117 Disturbances of salivary secretion: Secondary | ICD-10-CM

## 2018-07-27 DIAGNOSIS — G800 Spastic quadriplegic cerebral palsy: Secondary | ICD-10-CM

## 2018-11-03 ENCOUNTER — Ambulatory Visit (INDEPENDENT_AMBULATORY_CARE_PROVIDER_SITE_OTHER): Payer: Medicaid Other | Admitting: Family

## 2018-11-05 ENCOUNTER — Ambulatory Visit (INDEPENDENT_AMBULATORY_CARE_PROVIDER_SITE_OTHER): Payer: Self-pay | Admitting: Dietician

## 2018-11-05 ENCOUNTER — Ambulatory Visit (INDEPENDENT_AMBULATORY_CARE_PROVIDER_SITE_OTHER): Payer: Self-pay | Admitting: Family

## 2018-11-12 ENCOUNTER — Ambulatory Visit: Payer: Medicaid Other | Attending: Family Medicine | Admitting: Physical Therapy

## 2018-11-12 ENCOUNTER — Other Ambulatory Visit: Payer: Self-pay

## 2018-11-12 DIAGNOSIS — M6281 Muscle weakness (generalized): Secondary | ICD-10-CM | POA: Diagnosis present

## 2018-11-12 DIAGNOSIS — M25662 Stiffness of left knee, not elsewhere classified: Secondary | ICD-10-CM | POA: Diagnosis present

## 2018-11-12 DIAGNOSIS — M25661 Stiffness of right knee, not elsewhere classified: Secondary | ICD-10-CM | POA: Diagnosis present

## 2018-11-12 DIAGNOSIS — R29818 Other symptoms and signs involving the nervous system: Secondary | ICD-10-CM | POA: Diagnosis present

## 2018-11-12 DIAGNOSIS — R293 Abnormal posture: Secondary | ICD-10-CM | POA: Diagnosis present

## 2018-11-12 NOTE — Therapy (Signed)
[facility] [address] [place], [place], [address] Phone: [phone]   Fax:  [phone]  Physical Therapy Wheelchair Evaluation  Patient Details  Name: Alyssa Wong MRN: [id number] Date of Birth: 02-03-1991 Referring Provider (PT): Lin Landsman, [facility]   Encounter Date: 11/12/2018  PT End of Session - 11/12/18 2034    Visit Number  1    Number of Visits  1    Date for PT Re-Evaluation  11/12/18   wheelchair eval only   Authorization Type  Medicaid    PT Start Time  1450    PT Stop Time  1545    PT Time Calculation (min)  55 min    Activity Tolerance  Patient tolerated treatment well    Behavior During Therapy  Restless       Past Medical History:  Diagnosis Date  . Arthrogryposis multiplex congenita   . Contractures involving both knees   . Crouzon syndrome   . Intellectual delay   . Seizures ([place])   . 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 VP (ventriculoperitoneal) shunt status     Past Surgical History:  Procedure Laterality Date  . ELBOW SURGERY Left    Release of contractures  . GASTROSTOMY TUBE PLACEMENT    . HIP SURGERY      There were no vitals filed for this visit.   Subjective Assessment - 11/12/18 2023    Subjective  Patient presents to outpatient Neuro for wheelchair evaluation for new dependent manual wheelchair.  Pt's current w/c is 29 years old and no longer provides pt with sufficient support of postural impairments.      Patient is accompained by:  Family member    Pertinent History  spastic quadriparesis secondary to cerebral palsy, generalized convulsive epilepsy, Crouzon syndrome, intellectual delay, VP shunt, arthrogryposis multiplex congenital, gastronomy tube dependent, and contractures of bilat knees, surgery of bilat hips, and surgery of L elbow to release contractures    Patient Stated Goals  to obtain a newer wheelchair with increased postural support but  continues to be easily broken down for transportation    Currently in Pain?  No/denies         [facility] PT Assessment - 11/12/18 2031      Assessment   Medical Diagnosis  Spastic quadriparesis, seizures, Crouzon syndrome, contractures    Referring Provider (PT)  Lin Landsman, [facility]    Onset Date/Surgical Date  10/02/18      Balance Screen   Has the patient fallen in the past 6 months  No      Prior Function   Level of Independence  Needs assistance with gait;Needs assistance with transfers;Needs assistance with ADLs         Mobility/Seating Evaluation    PATIENT INFORMATION: Name: Alyssa Wong DOB: 1991/09/09  Sex: Female Date seen: 11/12/2018 Time: 14:45  Address:  [place] [address] Physician: Lin Landsman, MD This evaluation/justification form will serve as the LMN for the following suppliers: __________________________ Supplier: [employer] Contact Person: Deberah Pelton Phone:  [phone]   Seating Therapist: Misty Stanley, PT Phone:   [phone]   Phone: [phone]    Spouse/Parent/Caregiver name: Mother: Merlyn Albert   Phone number: [phone] Insurance/Payer: [place] Medicaid     Reason for Referral: To be assessed for a new wheelchair with improved seating/positioning   Patient/Caregiver Goals: To improve positioning in wheelchair but managable for break down, set up and transportation  Patient  was seen for face-to-face evaluation for new manual wheelchair.  Also present was Mother Naomie Dean and Deberah Pelton, [employer] to discuss recommendations and wheelchair options.  Further paperwork was completed and sent to vendor.  Patient appears to qualify for manual mobility device at this time per objective findings.   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  No falls    HOME ENVIRONMENT: _1 House  _2 Condo/town home  _3 Apartment  _4 Assisted Living    _5 Lives Alone _6  Lives with Others                                                                                          Hours with caregiver: 54 hours a week  _7 Home is accessible to patient           Stairs      _8 Yes _9  No     Ramp _10 Yes _11 No Comments:  [employer] house - built with wide doorways and no carpet   COMMUNITY ADL: TRANSPORTATION: _12 Car    _13 Van    <JXBJYNWGNFAOZHYQ>_6<\/VHQIONGEXBMWUXLK>_44 Public Transportation    _15 Adapted w/c Lift    _16 Ambulance    _17 Other:       _18 Sits in wheelchair during transport  Employment/School: M-W at [employer] requirements pertaining to mobility ?????  Other: Mother transfers pt into car and breaks down chair to transport in trunk - [employer].  When pt goes to school she rides [employer] and sits in w/c.    FUNCTIONAL/SENSORY PROCESSING SKILLS:  Handedness:   _19 Right     _20 Left    _21 NA  Comments:  Due to contractures and hand weakness pt unable to propel wheelchair  Functional Processing Skills for Wheeled Mobility _22 Processing Skills are adequate for safe wheelchair operation  Areas of concern than may interfere with safe operation of wheelchair Description of problem   _23  Attention to environment      _24 Judgment      _25  Hearing  _26  Vision or visual processing      _27 Motor Planning  _28  Fluctuations in Behavior  pt with significant intellectual delays    VERBAL COMMUNICATION: _29 WFL receptive _30  WFL expressive _31 Understandable  _32 Difficult to understand  _33 non-communicative _34  Uses an augmented  communication device  CURRENT SEATING / MOBILITY: Current Mobility Base:  _35 None _36 Dependent _37 Manual _38 Scooter _39 Power  Type of Control: ?????  Manufacturer:  Paula Compton 2 HPSize:  18 x 26 Age: [age] - 81 years old  Current Condition of Mobility Base:  Poor   Current Wheelchair components:  Depth Adjustable flip up foot plates with toe straps, swing away leg rests, height adjustable removable arm rests, wheel locks, custom planar cushion, deep contour Jay 2 back rest, contoured head  rest 12", chest harness, seat belt  Describe posture in present seating system:  significant lumbar lordosis, R lateral lean with head/neck hyperextension, UE not supported on arm rests, pt at times leans fully over the right side of the chair, L pelvis retracted, knees flexed and keeps L foot hanging behind foot plate      SENSATION and SKIN ISSUES: Sensation _0 Intact  _1 Impaired _2 Absent  Level of sensation: ????? Pressure Relief: Able to perform effective pressure relief :    _3 Yes  _4  No Method: ???? If not, Why?: Pt unable to perform effective weight shifts due to UE weakness and impaired balance; pt requires hand held assist to stand from wheelchair; would require total A to tip chair backwards for pressure relief  Skin Issues/Skin Integrity Current Skin Issues  _5 Yes _6 No _7 Intact _8  Red area_9  Open Area  _10 Scar Tissue _11 At risk from prolonged sitting Where  ?????  History of Skin Issues  _12 Yes _13 No Where  ????? When  ?????  Hx of skin flap surgeries  _14 Yes _15 No Where  ????? When  ?????  Limited sitting tolerance _16 Yes _17 No Hours spent sitting in wheelchair daily: 2 hours with mom, may be longer at school  Complaint of Pain:  Please describe: unknown, does cry out during seizures   Swelling/Edema: L hand due to positioning   ADL STATUS (in reference to wheelchair use):  Indep Assist Unable Indep with Equip Not assessed Comments  Dressing ????? X ????? ????? ????? Pt is dependent on  mother and caregivers for all ADL  Eating ????? ????? X ????? ????? Pt has G tube for feeding  Toileting ????? X ????? ????? ????? Total A  Bathing ????? X ????? ????? ????? Total A  Grooming/Hygiene ????? X ????? ????? ????? Total A  Meal Prep ????? ????? ????? ????? X Pt fed through G tube  IADLS ????? ????? ????? ????? X ?????  Bowel Management: _18 Continent  _19 Incontinent  _20 Accidents Comments:  mother and caregivers place pt on toilet for hygiene and to change brief  Bladder Management: _21 Continent  _22 Incontinent  _23 Accidents Comments:  mother and caregivers place pt on toilet for hygiene and to change brief     WHEELCHAIR SKILLS: Manual w/c Propulsion: _24 UE or LE strength and endurance sufficient to participate in ADLs using manual wheelchair Arm : _25 left _26 right   _27 Both      Distance: ????? Foot:  _28 left _29 right   _30 Both  Operate Scooter: _31  Strength, hand grip, balance and transfer appropriate for use _32 Living environment is accessible for use of scooter  Operate Power w/c:  _33  Std. Joystick   _34  Alternative Controls Indep _35  Assist _36  Dependent/unable _37  N/A _38   _39 Safe          _40  Functional      Distance: ?????  Bed confined without wheelchair _41  Yes _42  No   STRENGTH/RANGE OF MOTION:  PROM Range of Motion Strength  Shoulder limited to 90 deg of flexion 2-3/5 bilaterally  Elbow R elbow flexion contracture: 120 deg (lacks 60 deg to extension); L elbow 100 flexion contracture (lacks 80 deg to extension) 0/5 bilaterally  Wrist/Hand Flexion in wrist and fingers bilaterally 1/5  Hip LLE: flexion to 90 deg, lacks 40 deg to neutral extension RLE: flexion to 90 deg, lacks 30 deg to neutral extension 2-3/5 within available range  Knee RLE: lacks 45 deg to full extension LLE: lacks 65 deg to full extension 2-3/5 within available range  Ankle LLE: lacking 15 deg to neutral DF RLE: 90 deg  DF 0/5      MOBILITY/BALANCE:  _0  Patient is totally dependent for mobility  ?????     Balance Transfers Ambulation  Sitting Balance: Standing Balance: _1  Independent _2  Independent/Modified Independent  _3  WFL     _4  WFL _5  Supervision _6  Supervision  _7  Uses UE for balance  _8  Supervision _9  Min Assist _10  Ambulates with Assist  bilat HHA short distances in the home and to transfer    _11  Min Assist _12  Min assist _13  Mod Assist _14  Ambulates with Device:      _15  RW  _16  StW  _17  Cane  _18  ?????  _19  Mod Assist _20  Mod assist _21  Max assist   _22  Max Assist _23  Max assist _24  Dependent _25  Indep. Short Distance Only  _26  Unable _27  Unable _28  Lift / Sling Required Distance (in feet)  25'   _29  Sliding board _30  Unable to Ambulate (see explanation below)  Cardio Status:  _31 Intact  _32  Impaired   _33  NA     ?????  Respiratory Status:  _34 Intact   _35 Impaired   _36 NA     ?????  Orthotics/Prosthetics: no longer uses bilat AFO  Comments (Address manual vs power w/c vs scooter): Pt requires the use of dependent manual wheelchair.  Due to UE contractures and weakness in bilat UE pt is unable to propel a manual wheelchair, steer an electric scooter or safely steer a power wheelchair.  Pt also lacks sufficient sitting balance and postural/head control to safely transfer to and sit on an electric scooter.  Pt lacks sufficient cognition to safely navigate with a power wheelchair.         Anterior / Posterior Obliquity Rotation-Pelvis ?????  PELVIS    _37  _38  _39   Neutral Posterior Anterior  _40  _41  _42   WFL Rt elev Lt elev  _43  _44  _45   WFL Right Left                      Anterior    Anterior     _46  Fixed _47  Other _48  Partly Flexible _49  Flexible   _50  Fixed _51  Other _52  Partly Flexible  _53  Flexible  _54  Fixed _55  Other _56  Partly Flexible  _57  Flexible   TRUNK  _58  _59  _60   WFL ? Thoracic ? Lumbar  Kyphosis Lordosis  _61  _62  _63   WFL Convex Convex  Right Left _64 c-curve _65 s-curve _66 multiple  _67  Neutral _68  Left-anterior _69  Right-anterior     _70  Fixed _71  Flexible _72  Partly Flexible _73  Other   _74  Fixed _75  Flexible _76  Partly Flexible _77  Other  _78  Fixed             _79  Flexible _80  Partly Flexible _81  Other    Position Windswept  ?????  HIPS          _82            _83               _84    Neutral       Abduct        ADduct         _85           _86            _87   Neutral Right           Left      _88  Fixed _89  Subluxed _90  Partly Flexible _91  Dislocated _92  Flexible  _93  Fixed _94  Other _95  Partly Flexible  _96  Flexible  Foot Positioning Knee Positioning  ?????    _0  WFL  _1 Lt _2 Rt _3  WFL  _4 Lt _5 Rt    KNEES ROM concerns: ROM concerns:    & Dorsi-Flexed _6 Lt _7 Rt Bilat flexion contractures RLE: lacks 45 deg to full extension LLE: lacks 65 deg to full extension    FEET Plantar Flexed _8 Lt _9 Rt      Inversion                 _10 Lt _11 Rt      Eversion                 _12 Lt _13 Rt     HEAD _14  Functional _15  Good Head Control  ?????  & _16  Flexed         _17  Extended _18  Adequate Head Control    NECK _19  Rotated  Lt  _20  Lat Flexed Lt _21  Rotated  Rt _22  Lat Flexed Rt _23  Limited Head Control     _24  Cervical Hyperextension _25  Absent  Head Control     SHOULDERS ELBOWS WRIST& HAND increased flexor tone in hands/fingers but able to partly extend fingers      Left     Right    Left     Right    Left     Right   U/E _26 Functional           _27 Functional  L elbow 100 flexion contracture (lacks 80 deg to extension) R elbow flexion contracture: 120 deg (lacks 60 deg to extension); _28 Fisting             _29 Fisting      _30 elev   _31 dep      _32 elev   _33 dep       _34 pro -_35 retract     _36 pro  _37 retract _38 subluxed             _39 subluxed           Goals for Wheelchair Mobility  _40  Independence with mobility in the home with motor related ADLs (MRADLs)  _41  Independence with MRADLs in the community _42  Provide dependent mobility  _43  Provide recline     _44 Provide tilt   Goals for Seating system _45  Optimize pressure distribution _46  Provide support needed to facilitate function or  safety _47  Provide corrective forces to assist with maintaining or improving posture _48  Accommodate client's posture:   current seated postures and positions are not flexible or will not tolerate corrective forces _49  Client to be independent with relieving pressure in the wheelchair _50 Enhance physiological function such as breathing, swallowing, digestion  Simulation ideas/Equipment trials:????? State why other equipment was unsuccessful:Pt requires the use of dependent manual wheelchair.  Pt is not a functional ambulator due to inability to safely utilize a cane or walker due to UE contractures and weakness and LE contractures and weakness. Due to UE contractures and weakness in bilat UE pt is unable to propel a manual wheelchair, steer an electric scooter or safely steer a power wheelchair.  Pt also lacks sufficient sitting balance and postural/head control to safely transfer to and sit on an electric scooter.  Pt lacks sufficient cognition to safely navigate with a power wheelchair.   MOBILITY BASE RECOMMENDATIONS and JUSTIFICATION: MOBILITY COMPONENT JUSTIFICATION  Manufacturer: [employer] Model: Catalyst 4C   Size: Width 18 Seat Depth 18 _51 provide transport from point A to B      _52 promote Indep mobility  _53 is not a safe, functional ambulator _54 walker or cane inadequate _55 non-standard width/depth necessary to accommodate  anatomical measurement _0  ?????  _1 Manual Mobility Base _2 non-functional ambulator    _3 Scooter/POV  _4 can safely operate  _5 can safely transfer   _6 has adequate trunk stability  _7 cannot functionally propel manual w/c  _8 Power Mobility Base  _9 non-ambulatory  _10 cannot functionally propel manual wheelchair  _11  cannot functionally and safely operate scooter/POV _12 can safely operate and willing to  _13 Stroller Base _14 infant/child  _15 unable to propel manual wheelchair _16 allows for growth _17 non-functional ambulator _18 non-functional UE _19 Indep mobility is not a  goal at this time  _20 Tilt  _21 Forward _22 Backward _23 Powered tilt  _24 Manual tilt  _25 change position against gravitational force on head and shoulders  _26 change position for pressure relief/cannot weight shift _27 transfers  _28 management of tone _29 rest periods _30 control edema _31 facilitate postural control  _32  ?????  _33 Recline  _34 Power recline on power base _35 Manual recline on manual base  _36 accommodate femur to back angle  _37 bring to full recline for ADL care  _38 change position for pressure relief/cannot weight shift _39 rest periods _40 repositioning for transfers or clothing/diaper /catheter changes _41 head positioning  _42 Lighter weight required _43 self- propulsion  _44 lifting _45  ?????  _46 Heavy Duty required _47 user weight greater than 250# _48 extreme tone/ over active movement _49 broken frame on previous chair _50  ?????  _51  Back  _52  Angle Adjustable _53  Custom molded Acta Relief BS 18W x 18L _54 postural control _55 control of tone/spasticity _56 accommodation of range of motion _57 UE functional control _58 accommodation for seating system _59  ????? _60 provide lateral trunk support _61 accommodate deformity _62 provide posterior trunk support _63 provide lumbar/sacral support _64 support trunk in midline _65 Pressure relief over spinal processes  _66  Seat Cushion Custom Inception Seat with cushion 18W 16D _67 impaired sensation  _68 decubitus ulcers present _69 history of pressure ulceration _70 prevent pelvic extension _71 low maintenance  _72 stabilize pelvis  _73 accommodate obliquity _74 accommodate multiple deformity _75 neutralize lower extremity position _76 increase pressure distribution _77  Incontinence liner due to accidents  _78  Pelvic/thigh support  _79  Lateral thigh guide _80  Distal medial pad  _81  Distal lateral pad _82  pelvis in neutral _83 accommodate pelvis _84  position upper legs _85  alignment _86  accommodate ROM _87  decr adduction _88 accommodate tone _89 removable for transfers _90 decr abduction   _91  Lateral trunk Supports _92  Lt     _93  Rt _94 decrease lateral trunk leaning _95 control tone _96 contour for increased contact _97 safety  _98 accommodate asymmetry _99  Bodilink Lateral Trunk Support R/L  _100  Mounting hardware  _101 lateral trunk supports  _102 back   _103 seat _104 headrest      _105  thigh support _106 fixed   _107 swing away _108 attach seat platform/cushion to w/c frame _109 attach back cushion to w/c frame _110 mount postural supports _111 mount headrest  _112 swing medial thigh support away _113 swing lateral supports away for transfers  _114  Solid seat pan with standard leg length discrepancy due to 2" Left standard leg discrepancy    Armrests  _115 fixed _116 adjustable height _117 removable   _118 swing away  _119 flip back   _120 reclining _121 full length pads _122 desk    _123 pads tubular  _124 provide support with elbow at 90   _125 provide support for w/c tray _126 change of height/angles for variable activities _127 remove for transfers _128 allow to come closer to table top _129 remove for access to tables _130  ?????  Hangers/ Leg rests  _131 60 _132 70 _133 90 _134 elevating _135 heavy duty  _136 articulating _137 fixed _138 lift off _139 swing away     _140 power _141 provide LE support  _142 accommodate to hamstring tightness _143 elevate legs during recline   _144 provide change in position for Legs _145 Maintain placement of feet on footplate _146 durability _147 enable transfers _148 decrease edema _149 Accommodate lower leg length _150  80 degree leg rests  Foot support Footplate    <ZOXWRUEAVWUJWJXB>_1<\/YNWGNFAOZHYQMVHQ>_469 Lt  _152   Rt  _0  Center mount _1 flip up     _2 depth/angle adjustable _3 Amputee adapter    _4  Lt     _5  Rt _6 provide foot support _7 accommodate to ankle ROM _8 transfers _9 Provide support for residual extremity _10  allow foot to go under wheelchair base _11  decrease tone  _12  ?????  _13  Ankle strap/heel loops _14 support foot on foot support _15 decrease extraneous movement _16 provide input to heel  _17 protect foot  Tires: _18 pneumatic  _19 flat free inserts  _20 solid   _21 decrease maintenance  _22 prevent frequent flats _23 increase shock absorbency _24 decrease pain from road shock _25 decrease spasms from road shock _26  ?????  _27  Headrest  _28 provide posterior head support _29 provide posterior neck support _30 provide lateral head support _31 provide anterior head support _32 support during tilt and recline _33 improve feeding   _34 improve respiration _35 placement of switches _36 safety  _37 accommodate ROM  _38 accommodate tone _39 improve visual orientation  _40  Anterior chest strap _41  Vest _42  Shoulder retractors  _43 decrease forward movement of shoulder _44 accommodation of TLSO _45 decrease forward movement of trunk _46 decrease shoulder elevation _47 added abdominal support _48 alignment _49 assistance with shoulder control  _50  prevent leaning/falling out of chair  Pelvic Positioner _51 Belt _52 SubASIS bar _53 Dual Pull _54 stabilize tone _55 decrease falling out of chair/ **will not Decr potential for sliding due to pelvic tilting _56 prevent excessive rotation _57 pad for protection over boney prominence _58 prominence comfort _59 special pull angle to control rotation _60  ?????  Upper Extremity Support _61 L   _62  R _63 Arm trough    _64 hand support _65  tray       _66 full tray _67 swivel mount _68 decrease edema      _69 decrease subluxation   _70 control tone   _71 placement for AAC/Computer/EADL _72 decrease gravitational pull on shoulders _73 provide midline positioning _74 provide support to increase UE function _75 provide hand support in natural position _76 provide work surface   POWER WHEELCHAIR CONTROLS  _77 Proportional  _78 Non-Proportional Type ????? _79 Left  _80 Right _81 provides access for controlling wheelchair   _82 lacks motor control to operate proportional drive control <LOVFIEPPIRJJOACZ>_6<\/SAYTKZSWFUXNATFT>_73 unable to understand proportional controls  Actuator Control Module  _84 Single  _85 Multiple   _86 Allow the client to operate the power seat function(s) through the joystick control   _87 Safety Reset Switches  _88 Used to change modes and stop the wheelchair when driving in latch mode    _89 Upgraded Electronics   _90 programming for accurate control _91 progressive Disease/changing condition _92 non-proportional drive control needed _93 Needed in order to operate power seat functions through joystick control   _94 Display box _95 Allows user to see in which mode and drive the wheelchair is set  _96 necessary for alternate controls    _97 Digital interface electronics _98 Allows w/c to operate when using alternative drive controls  <UKGURKYHCWCBJSEG>_3<\/TDVVOHYWVPXTGGYI>_94 ASL Head Array _100 Allows client to operate wheelchair  through switches placed in tri-panel headrest  _101 Sip and puff with tubing kit _102 needed to operate sip and puff drive controls  <WNIOEVOJJKKXFGHW>_2<\/XHBZJIRCVELFYBOF>_751 Upgraded tracking electronics _104 increase safety when driving <WCHENIDPOEUMPNTI>_1<\/WERXVQMGQQPYPPJK>_932 correct tracking when on uneven surfaces  _106 [facility] for switches or joystick _107 Attaches switches to w/c  _108 Swing away for access or transfers _109 midline for optimal placement _110 provides for consistent access  _111 Attendant controlled joystick plus mount _112 safety _113 long distance driving <IZTIWPYKDXIPJASN>_0<\/NLZJQBHALPFXTKWI>_097 operation of seat functions _115 compliance with transportation regulations _116  ?????    Rear wheel placement/Axle adjustability _117 None _118 semi adjustable _119 fully adjustable  _120 improved UE access to wheels _121 improved stability _122 changing angle in space for improvement of postural stability _123 1-arm drive access <DZHGDJMEQASTMHDQ>_2<\/IWLNLGXQJJHERDEY>_814 amputee pad placement _125  ?????  Wheel rims/ hand rims  _126 metal  _127 plastic coated _128 oblique projections _129 vertical projections _130 Provide ability to propel manual wheelchair  _131  Increase self-propulsion with hand weakness/decreased  grasp  Push handles _0 extended  _1 angle adjustable  _2 standard _3 caregiver access _4 caregiver assist _5 allows "hooking" to enable increased ability to perform ADLs or maintain balance  One armed device  _6 Lt   _7 Rt _8 enable propulsion of manual wheelchair with one arm   _9  ?????   Brake/wheel lock extension _10  Lt   _11  Rt  _12 increase indep in applying wheel locks   _13 Side guards _14 prevent clothing getting caught in wheel or becoming soiled _15  prevent skin tears/abrasions  Battery: ????? _16 to power wheelchair ?????  Other: Rear anti-tippers Prevent posterior tipping of wheelchair ?????  The above equipment has a life- long use expectancy. Growth and changes in medical and/or functional conditions would be the exceptions. This is to certify that the therapist has no financial relationship with durable medical provider or manufacturer. The therapist will not receive remuneration of any kind for the equipment recommended in this evaluation.   Patient has mobility limitation that significantly impairs safe, timely participation in one or more mobility related ADL's.  (bathing, toileting, feeding, dressing, grooming, moving from room to room)                                                             _17  Yes _18  No Will mobility device sufficiently improve ability to participate and/or be aided in participation of MRADL's?         _19  Yes _20  No Can limitation be compensated for with use of a cane or walker?                                                                                _21  Yes _22  No Does patient or caregiver demonstrate ability/potential ability & willingness to safely use the mobility device?   _23  Yes _24  No Does patient's home environment support use of recommended mobility device?                                                    _25  Yes _26  No Does patient have sufficient upper extremity function necessary to functionally propel a manual wheelchair?    _27  Yes _28  No Does patient have sufficient strength and trunk stability to safely operate a POV (scooter)?                                  _29  Yes _30  No Does patient need additional features/benefits provided by a power wheelchair for MRADL's in the home?       _31  Yes _32  No Does the patient demonstrate the ability to safely use a power wheelchair?                                                               _33   Yes _0  No  Therapist Name Printed: Rico Junker, PT, DPT Date: 11/12/18  Therapist's Signature:   Date:   Supplier's Name Printed: De[employer]ah Pelton, [employer] Date: 11/12/18  Supplier's Signature:   Date:  Patient/Caregiver Signature:   Date:     This is to certify that I have read this evaluation and do agree with the content within:      Physician's Name Printed: Lin Landsman, MD  30 Signature:  Date:     This is to certify that I, the above signed therapist have the following affiliations: _1  This DME provider _2  Manufacturer of recommended equipment _3  Patient's long term care facility _4  None of the above             Objective measurements completed on examination: See above findings.                           Plan - 11/12/18 2035    Clinical Impression Statement  Pt is a 27 year old female referred to Neuro OPPT for evaluation for new manual wheelchair.  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  Wheelchair evaluation completed in conjunction with Deberah Pelton, [employer] with [employer].  Full evaluation with impairments and seating recommendations to follow in LMN.      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, generalized convulsive epilepsy, Crouzon syndrome, intellectual delay, VP shunt, arthrogryposis multiplex congenital, gastronomy tube dependent, and contractures of bilat knees, surgery of bilat hips, and surgery of L elbow to release contractures    Clinical Decision Making  Moderate    PT Frequency  One time visit    PT Duration  Other (comment)   one time eval only   PT Treatment/Interventions  Other (comment)   wheelchair evaluation   Consulted and Agree with Plan of Care  Patient;Family member/caregiver    Family Member Consulted  Mother       Patient will benefit from skilled therapeutic intervention in order to improve the following deficits and impairments:  Decreased range of motion, Decreased mobility, Decreased strength, Impaired flexibility, Postural dysfunction, Impaired UE functional use, Impaired tone  Visit Diagnosis: Other symptoms and signs involving the nervous system  Stiffness of left knee, not elsewhere classified  Stiffness of right knee, not elsewhere classified  Abnormal posture  Muscle weakness (generalized)     Problem List Patient Active Problem List   Diagnosis Date Noted  . Loss of weight 07/17/2018  . Gastrostomy tube dependent ([place]) 07/17/2018  . Drooling 03/04/2017  . Generalized convulsive epilepsy ([place]) 12/29/2013  . Encounter for long-term (current) use of other medications 12/29/2013  . 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 ([place])   . Intellectual delay   . VP (ventriculoperitoneal)  shunt status   . Arthrogryposis multiplex congenita   . Crouzon syndrome   . Contractures involving both knees     Rico Junker, PT, DPT 11/12/18    8:40 PM    [facility] [address] [place],  [place], [address] Phone: [phone]   Fax:  [phone]  Name: SHANIKIA KERNODLE MRN: [id number] Date of Birth: 09-05-1991

## 2018-11-27 NOTE — Progress Notes (Signed)
Medical Nutrition Therapy - Initial Assessment Appt start time: 2:50 PM Appt end time: 3:05 PM Reason for referral: G-tube Dependence, wt loss Referring provider: [name], NP - Neuro DME: [facility] Pertinent medical hx: Crouzon syndrome, CP with spastic quadriparesis, epilepsy, VP shunt, wt loss, +Gtube  Assessment: Food allergies: none Pertinent Medications: see medication list Vitamins/Supplements: none Pertinent labs: none  Anthropometrics: Reported Ht: 149.9 cm (1/6) Wt: 43.6 kg BMI: 19.4 NFPA: adequate fat and muscle stores in limbs and abdomen  Estimated minimum caloric needs: 1400 kcal/day (EER) Estimated minimum protein needs: 0.85 g/kg/day (DRI) Estimated minimum fluid needs: 1400 mL/day (1 mL/kcal)  Primary concerns today: Mom accompanied pt to appt. Per mom, she felt like pt had lost wt at last visit and was concerned she was not receiving enough nutrition.  Dietary Intake Hx: Usual feeding regimen: Osmolite q 2-3 hrs (6 cans total daily) over 10 minutes gravity- has been on this regimen for 2+ years per mom 60 mL after each feed PO foods: none  GI: regular, daily - Miralax  Physical Activity: wheelchair dependent  Estimated caloric intake: 1422 kcal/day - meets 101% of estimated needs Estimated protein intake: 1.4 g/kg/day - meets 164% of estimated needs Estimated fluid intake: 1554 mL/day - meets 111% of estimated needs Estimated micronutrient intake likely meeting needs given pt consuming >1321 mL of formula.  Nutrition Diagnosis: (1/6) Altered GI function related to medical condition as evidence by pt dependent on Gtube for 100% of nutritional needs.  Intervention: Discussed current regimen and mom's concerns. Given mom stated pt had lost wt at last appt yet Tina's note said the wt was reported, RD weighed pt with mom's help. Pt states she thinks pt has out her weight back on. Discussed continuing current regimen as pt is at a good wt.  Discussed follow up in 6 months, but to call RD if mom had any issues or questions. Recommendations: - Continue current feeding regimen. - Please call the office if you have any issues.   Teach back method used.  Monitoring/Evaluation: Goals to Monitor: - Wt trends - TF tolerance  Follow-up in 6 months, joint with provider.  Total time spent in counseling: 15 minutes.

## 2018-11-30 ENCOUNTER — Ambulatory Visit (INDEPENDENT_AMBULATORY_CARE_PROVIDER_SITE_OTHER): Payer: Medicaid Other | Admitting: Dietician

## 2018-11-30 ENCOUNTER — Ambulatory Visit (INDEPENDENT_AMBULATORY_CARE_PROVIDER_SITE_OTHER): Payer: Medicaid Other | Admitting: Family

## 2018-11-30 ENCOUNTER — Encounter (INDEPENDENT_AMBULATORY_CARE_PROVIDER_SITE_OTHER): Payer: Self-pay | Admitting: Family

## 2018-11-30 VITALS — BP 100/68 | HR 72 | Ht 59.0 in | Wt 94.0 lb

## 2018-11-30 VITALS — Wt 96.1 lb

## 2018-11-30 DIAGNOSIS — Z931 Gastrostomy status: Secondary | ICD-10-CM

## 2018-11-30 DIAGNOSIS — G40309 Generalized idiopathic epilepsy and epileptic syndromes, not intractable, without status epilepticus: Secondary | ICD-10-CM

## 2018-11-30 DIAGNOSIS — Q751 Craniofacial dysostosis: Secondary | ICD-10-CM

## 2018-11-30 DIAGNOSIS — R634 Abnormal weight loss: Secondary | ICD-10-CM

## 2018-11-30 DIAGNOSIS — K117 Disturbances of salivary secretion: Secondary | ICD-10-CM | POA: Diagnosis not present

## 2018-11-30 DIAGNOSIS — M24561 Contracture, right knee: Secondary | ICD-10-CM

## 2018-11-30 DIAGNOSIS — F819 Developmental disorder of scholastic skills, unspecified: Secondary | ICD-10-CM

## 2018-11-30 DIAGNOSIS — Q743 Arthrogryposis multiplex congenita: Secondary | ICD-10-CM

## 2018-11-30 DIAGNOSIS — Z982 Presence of cerebrospinal fluid drainage device: Secondary | ICD-10-CM

## 2018-11-30 DIAGNOSIS — G800 Spastic quadriplegic cerebral palsy: Secondary | ICD-10-CM | POA: Diagnosis not present

## 2018-11-30 DIAGNOSIS — M24562 Contracture, left knee: Secondary | ICD-10-CM

## 2018-11-30 NOTE — Patient Instructions (Addendum)
-   Continue current feeding regimen. - Please call the office if you have any issues.

## 2018-11-30 NOTE — Progress Notes (Signed)
Alyssa Wong   [id number]  female  06/04/1991   Provider: Elveria Risingina Buck Mcaffee  [facility]  Visit type: Routine return visit  Last visit: 07/17/2018  Referral source: Leilani AbleBetti Reese, MD History from: mother and chcn chart  Brief history:  28 year old woman with history of Crouzon syndrome, generalized seizures, significant cognitive delay, spastic quadriparesis, VP shunt with history of revision, 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 She is taking and tolerating Lamotrigine and Valproate for seziures. She has dysphagia and requires feedings by g-tube, with Osmolyte as her formula.  Today's concerns:  When Alyssa Wong was last seen, Mom was concerned that Alyssa Wong had lost about 10 lbs, and was interested in a referral to a dietician. Mom tells me today that she does not feel that Alyssa Wong has lost more weight and says that she is tolerating her feedings. She will be seeing the dietician at this office today. When she was last seen, I referred Alyssa Wong for wheelchair evaluation as her wheelchair does not fit her well and she is not secure in the chair. Mom tells me today that was done a few weeks ago. She is unsure which agency is going to be ordering a chair for her.  Mom is concerned today because Alyssa Wong occasionally jerks in her sleep. She says that it is usually one fast jerk of her entire body. She has not experienced seizures since her last visit.   Review of systems: Please see HPI for neurologic and other pertinent review of systems. Otherwise all other systems were reviewed and were negative.  Problem List: Patient Active Problem List   Diagnosis Date Noted  . Loss of weight 07/17/2018  . 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 Drooling 03/04/2017  . 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 Encounter for long-term (current) use of other medications 12/29/2013  . Spastic quadriparesis secondary to  cerebral palsy ([facility])   . Intellectual delay   . VP (ventriculoperitoneal) shunt status   . Arthrogryposis multiplex congenita   . Crouzon syndrome   . Contractures involving both knees      Past Medical History:  Diagnosis Date  . Arthrogryposis multiplex congenita   . Contractures involving both knees   . Crouzon syndrome   . Intellectual delay   . Seizures ([facility])   . 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 VP (ventriculoperitoneal) shunt status     Past medical history comments: See HPI Copied from previous record: history of Crouson syndrome, generalized seizures, significant cognitive delay, spastic quadriparesis, VP shunt without history of revision, arthrogryposis multiplex in the arms and spastic contractures in her legs  Surgical history: Past Surgical History:  Procedure Laterality Date  . ELBOW SURGERY Left    Release of contractures  . GASTROSTOMY TUBE PLACEMENT    . HIP SURGERY       Family history: family history includes Kidney disease in her maternal grandfather.   Social history: Social History   Socioeconomic History  . Marital status: Single    Spouse name: Not on file  . Number of children: Not on file  . Years of education: Not on file  . Highest education level: Not on file  Occupational History  . Not on file  Social Needs  . Financial resource strain: Not on file  . Food insecurity:    Worry: Not on file    Inability: Not on file  . Transportation needs:  Medical: Not on file    Non-medical: Not on file  Tobacco Use  . Smoking status: Never Smoker  . Smokeless tobacco: Never Used  Substance and Sexual Activity  . Alcohol use: No  . Drug use: No  . Sexual activity: Never  Lifestyle  . Physical activity:    Days per week: Not on file    Minutes per session: Not on file  . Stress: Not on file  Relationships  . Social connections:    Talks on phone: Not on file    Gets together: Not on file    Attends  religious service: Not on file    Active member of club or organization: Not on file    Attends meetings of clubs or organizations: Not on file    Relationship status: Not on file  . Intimate partner violence:    Fear of current or ex partner: Not on file    Emotionally abused: Not on file    Physically abused: Not on file    Forced sexual activity: Not on file  Other Topics Concern  . Not on file  Social History Narrative   Alyssa Wong is a young woman who graduated from [employer] with a certificate in 2013. She is enrolled in the day programs [employer] [facility] [employer]. She lives with her mother and she has 3 siblings,  34 yo brother at home   Allergies: No Known Allergies   Immunizations:  There is no immunization history on file for this patient.   Physical Exam: BP 100/68   Pulse 72   Ht 4\' 11"  (1.499 m)   Wt 94 lb (42.6 kg)   BMI 18.99 kg/m   General: small statured but well developed, well nourished woman, seated in wheelchair, in no evident distress; black hair, brown eyes, even handed Head: dolichocephalic and atraumatic. Oropharynx appears benign but I am unable to fully visualize her pharynx due to her inability to fully open her mouth for examination. She has dysmorphic features with exophthalmos, ocular hypertelorism, midface hypoplasia. Neck: supple with no carotid bruits. Cardiovascular: regular rate and rhythm, no murmurs. Respiratory: Clear to auscultation bilaterally Abdomen: Bowel sounds present all four quadrants, abdomen soft, non-tender, non-distended. No hepatosplenomegaly or masses palpated.Gastrostomy button in place. Musculoskeletal: Spastic quadriparesis with arthrogryposis in the right upper extremity. Her back is arched and does not touch the back of the wheelchair. Skin: no rashes or neurocutaneous lesions  Neurologic Exam Mental Status: Awake and fully alert. Has no language.  Smiles responsively at times. Unable to follow  instructions or participate in examination. Resistant to invasions in to her space Cranial Nerves: Fundoscopic exam - red reflex present.  Unable to fully visualize fundus. She has dysconjugate eye movements. I am unable to get her fix or follow on faces or objects. She blinks to threat. She is able to protrude her tongue but not on command. She has lower facial weakness with mild drooling. Her neck is in an exaggerated flexion and extension. Motor: Spastic quadriparesis with arthrogryposis in the right upper extremity. She has poor fine motor skills. Sensory: Withdrawal x 4 Coordination: Unable to adequately assess due to patient's inability to participate in examination. Does not reach for objects. Gait and Station: Unable to independently stand and bear weight.  Reflexes: 1+ at the left patella otherwise absent DTR's. Toes neutral. No clonus  Impression: 1.  Crouzon syndrome 2.  Generalized convulsive epilepsy 3.  Spastic quadriparesis 4.  Contractures involving multiple joints 5.  Arthrogryposis multiplex congenita 6.  Significant developmental delay 7.  History of VP shunt  Recommendations for plan of care: The patient's previus [facility] records were reviewed. Alyssa Wong has neither had nor required imaging or lab studies since the last visit. She is a 28 year old woman with history of Crouzon syndrome, generalized convulsive epilepsy, spatic quadriparesis, contractures involving multiple joints, arthrogryposis multiplex congenita, significant developmental delay and VP shunt. She is taking and tolerating Lamotrigine and Valproate for her seizure disorder and has remained seizure free since her last visit. I talked with her Mom about the jerks she has seen at night and explained that this is an involuntary movement that is common in sleep called myoclonic jerks. Alyssa Wong is otherwise doing well at this time. I will see her back in follow up in 6 months or sooner if needed. Mom agreed with the plans made  today.  The medication list was reviewed and reconciled. No changes were made in the prescribed medications today. A complete medication list was provided to the patient.  Allergies as of 11/30/2018   No Known Allergies     Medication List       Accurate as of November 30, 2018  2:08 PM. Always use your most recent med list.        DEPAKENE 250 MG/5ML Soln solution Generic drug:  Valproate Sodium TAKE 5 ML BY MOUTH TWICE DAILY   glycopyrrolate 2 MG tablet Commonly known as:  ROBINUL TAKE 1/2 TO 1 TABLET BY MOUTH THREE TIMES DAILY AS NEEDED FOR DROOLING   lactulose 10 GM/15ML solution Commonly known as:  CHRONULAC TAKE 1 TEASPOONFUL(5 ML) BY MOUTH TWICE DAILY AS NEEDED FOR CONSTIPATION   lamoTRIgine 25 MG Chew chewable tablet Commonly known as:  LAMICTAL CHEW AND SWALLOW 1 TABLET BY MOUTH EVERY MORNING AND 2 TABLETS AT BEDTIME.   loratadine 10 MG tablet Commonly known as:  CLARITIN 1 tab per G-tube daily   loratadine-pseudoephedrine 10-240 MG 24 hr tablet Commonly known as:  CLARITIN-D 24-hour TAKE 1 TABLET BY MOUTH EVERY DAY   norgestrel-ethinyl estradiol 0.3-30 MG-MCG tablet Commonly known as:  LO/OVRAL,CRYSELLE TAKE 1 TABLET BY MOUTH EVERY DAY AS DIRECTED      Total time spent with the patient was 20 minutes, of which 50% or more was spent in counseling and coordination of care.  [name] NP-C [facility] Ph. [phone] Fax [phone]

## 2018-12-03 NOTE — Patient Instructions (Signed)
Thank you for coming in today.   Instructions for you until your next appointment are as follows: 1. Continue Alyssa Wong's medications as you have been giving them. 2. Let me know if she has any seizures 3. Please sign up for MyChart if you have not done so 4. Please plan to return for follow up in 6 months or sooner if needed.

## 2019-02-03 ENCOUNTER — Other Ambulatory Visit (INDEPENDENT_AMBULATORY_CARE_PROVIDER_SITE_OTHER): Payer: Self-pay | Admitting: Family

## 2019-02-03 ENCOUNTER — Other Ambulatory Visit: Payer: Self-pay | Admitting: Family

## 2019-02-03 DIAGNOSIS — G40309 Generalized idiopathic epilepsy and epileptic syndromes, not intractable, without status epilepticus: Secondary | ICD-10-CM

## 2019-09-16 ENCOUNTER — Telehealth (INDEPENDENT_AMBULATORY_CARE_PROVIDER_SITE_OTHER): Payer: Self-pay | Admitting: Family

## 2019-09-16 DIAGNOSIS — G40309 Generalized idiopathic epilepsy and epileptic syndromes, not intractable, without status epilepticus: Secondary | ICD-10-CM

## 2019-09-16 MED ORDER — VALPROIC ACID 250 MG/5ML PO SOLN: ORAL | 0 refills | Status: DC

## 2019-09-16 MED ORDER — LAMOTRIGINE 25 MG PO CHEW: CHEWABLE_TABLET | ORAL | 0 refills | Status: DC

## 2019-09-16 NOTE — Telephone Encounter (Signed)
I have not received any requests from the pharmacy to refill medication. I sent in a refill on Lamotrigine. The patient needs an appointment - phone or Webex is fine with me. Please call Mom and schedule follow up visit. Thanks, Otila Kluver

## 2019-09-16 NOTE — Telephone Encounter (Signed)
  Who's calling (name and relationship to patient) : Naomie Dean, mom  Best contact number: [phone]  Provider they see: Rockwell Germany  Reason for call: Mom calling to request prior authorization for a medication called lamotrigine, called the pharmacy and they said it was still pending authorization from Doctor. She put the original order for the prescription on Sunday. The pharmacy gave her enough for 5 days, so patient will run out tomorrow. Please advise.     PRESCRIPTION REFILL ONLY  Name of prescription:  Pharmacy:

## 2019-09-17 NOTE — Telephone Encounter (Signed)
I sent refills to the pharmacy yesterday. Please let Mom know so Aristea will have medication for the weekend. Thanks, Otila Kluver

## 2019-09-17 NOTE — Telephone Encounter (Signed)
°  Who's calling (name and relationship to patient) : Lattie Haw (mom)  Best contact number: [phone]  Provider they see: Cloretta Ned   Reason for call: Mom called and made a phone visit for 09/24/19 so refill can go to the pharmacy.    PRESCRIPTION REFILL ONLY  Name of prescription:  Pharmacy:

## 2019-09-17 NOTE — Telephone Encounter (Signed)
lvm for mom letting her know that the med had been sent to the pharmacy

## 2019-09-24 ENCOUNTER — Ambulatory Visit (INDEPENDENT_AMBULATORY_CARE_PROVIDER_SITE_OTHER): Payer: Medicaid Other | Admitting: Family

## 2019-09-24 DIAGNOSIS — Q743 Arthrogryposis multiplex congenita: Secondary | ICD-10-CM

## 2019-09-24 DIAGNOSIS — G800 Spastic quadriplegic cerebral palsy: Secondary | ICD-10-CM | POA: Diagnosis not present

## 2019-09-24 DIAGNOSIS — Z931 Gastrostomy status: Secondary | ICD-10-CM

## 2019-09-24 DIAGNOSIS — M24561 Contracture, right knee: Secondary | ICD-10-CM

## 2019-09-24 DIAGNOSIS — G40309 Generalized idiopathic epilepsy and epileptic syndromes, not intractable, without status epilepticus: Secondary | ICD-10-CM

## 2019-09-24 DIAGNOSIS — Q751 Craniofacial dysostosis: Secondary | ICD-10-CM

## 2019-09-24 DIAGNOSIS — M24562 Contracture, left knee: Secondary | ICD-10-CM

## 2019-09-24 DIAGNOSIS — F819 Developmental disorder of scholastic skills, unspecified: Secondary | ICD-10-CM

## 2019-09-24 DIAGNOSIS — Z982 Presence of cerebrospinal fluid drainage device: Secondary | ICD-10-CM

## 2019-09-24 MED ORDER — LAMOTRIGINE 25 MG PO CHEW: CHEWABLE_TABLET | ORAL | 5 refills | Status: DC

## 2019-09-24 MED ORDER — VALPROIC ACID 250 MG/5ML PO SOLN: ORAL | 5 refills | Status: DC

## 2019-09-25 ENCOUNTER — Encounter (INDEPENDENT_AMBULATORY_CARE_PROVIDER_SITE_OTHER): Payer: Self-pay | Admitting: Family

## 2019-09-25 NOTE — Patient Instructions (Signed)
Thank you for talking with me by phone today.   Instructions for you until your next appointment are as follows: 1. Continue Alyssa Wong's medications as you have been giving them.  2. Let me know if she has any seizures or if you have any other concerns.  3. Work on dressing her in [place] clothes and bed linens to help reduce her getting too hot during sleep.  4. Please sign up for MyChart if you have not done so 5. Please plan to return for follow up in 6 months or sooner if needed.

## 2019-09-25 NOTE — Progress Notes (Signed)
This is a Pediatric Specialist E-Visit follow up consult provided via Telephone Alyssa Wong and her mother Alyssa Wong consented to an E-Visit consult today.  Location of patient: Alyssa ClockKierra is at home Location of provider: Damita Dunningsina Quinnetta Roepke,NP-C is at office Patient was referred by Leilani ACorinda Gublerbleeese, Betti, MD   The following participants were involved in this E-Visit: patient's mother and NP  Chief Complain/ Reason for E-Visit today: seizure follow up Total time on call: 15 min Follow up: 6 months     Alyssa GublerKierra N Wong   MRN:  [id number]  07/22/1991   Provider: Elveria Risingina Kinze Labo NP-C [facility]  Visit type: Routine return visit  Last visit: 11/30/2018  Referral source: Leilani AbleBetti Reese, MD [facility] and patient's mother  Brief history:  Copied from previous record: 28 year old woman with history of Crouzon syndrome, generalized seizures, significant cognitive delay, spastic quadriparesis, VP shunt with history of revision, 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 She is taking and tolerating Lamotrigine and Valproate for seziures. She has dysphagia and requires feedings by g-tube, with Osmolyte as her formula.  Today's concerns: Mom reports today that Alyssa ClockKierra has had 2 seizures since she was last seen. She said that they occurred during sleep and at times when she had wrapped herself in bed linens and was overheated. The seizures were brief and she recovered quickly.   Mom reports that Alyssa ClockKierra has been otherwise generally healthy since she was last seen. Mom has no other health concerns for Alyssa ClockKierra today other than previously mentioned.   Review of systems: Please see HPI for neurologic and other pertinent review of systems. Otherwise all other systems were reviewed and were negative.  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   Drooling 03/04/2017   Encounter for long-term (current) use of other medications 12/29/2013   Intellectual delay    VP (ventriculoperitoneal) shunt status    Arthrogryposis multiplex congenita    Contractures involving both knees      Past Medical History:  Diagnosis Date   Arthrogryposis multiplex congenita    Contractures involving both knees    Crouzon syndrome    Intellectual delay    Seizures (HCC)    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   Social history: Social History   Socioeconomic History   Marital status: Single    Spouse name: Not on file   Number of children: Not on file   Years of education: Not on file   Highest education level: Not on file  Occupational History   Not on file  Social Designer, fashion/clothing strain: Not on file   Food insecurity    Worry: Not on file    Inability: Not on file   Transportation needs    Medical: Not on file    Non-medical: Not on file  Tobacco Use   Smoking status: Never Smoker   Smokeless tobacco: Never Used  Substance and Sexual Activity   Alcohol use: No   Drug use: No   Sexual activity:  Never  Lifestyle   Physical activity    Days per week: Not on file    Minutes per session: Not on file   Stress: Not on file  Relationships   Social connections    Talks on phone: Not on file    Gets together: Not on file    Attends religious service: Not on file    Active member of club or organization: Not on file    Attends meetings of clubs or organizations: Not on file    Relationship status: Not on file   Intimate partner violence    Fear of current or ex partner: Not on file    Emotionally abused: Not on file    Physically abused: Not on file    Forced sexual activity: Not on file  Other Topics Concern   Not on file  Social History Narrative   Alyssa Wong is a young woman who graduated from [employer] with a certificate in 9381. She is enrolled in the day programs [facility] [employer] [facility] [employer]. She lives with her mother and she has 3 siblings,  1 yo brother at home     Past/failed meds:   Allergies: No Known Allergies    Immunizations:  There is no immunization history on file for this patient.    Diagnostics/Screenings: 07/05/2006 - CT head wo contrast - Study technically limited to patient positioning and inability to straighten head. Right ventricular drain is in place without hydrocephalus. No definite acute intracranial abnormality  Physical Exam: There were no vitals taken for this visit.  There was no examination as it was a telephone visit.  Impression: 1. Crouzon syndrome 2. Generalized convulsive epilepsy 3. Spastic quadriparesis 4. Contractures involving multiple joints 5. Arthrogryposis multiplex congenita 6. Significant developmental delay 7. S/P VP shunt   Recommendations for plan of care: The patient's previous [facility] records were reviewed. Alyssa Wong has neither had nor required imaging or lab studies since the last visit. She is a 28 year old woman with history of Crouzon syndrome, generalized convulsive epilepsy, spastic  quadriparesis, contractures at multiple sites, arthrogryposis multiplex congenita, significant developmental delay and VP shunt. She is taking and tolerating Lamotrigine and Valproate for her seizure disorder. Alyssa Wong has had 2 seizures since her last visit related to being overheated. I recommended no change in her treatment plan at this time, and talked with Mom about ways to avoid overheating. I will see Alyssa Wong back in follow up in 6 months or sooner if needed. Mom agreed with the plans made today.   The medication list was reviewed and reconciled. No changes were made in the prescribed medications today. A complete medication list was provided to the patient.  Allergies as of 09/24/2019   No Known Allergies     Medication List       Accurate as of September 24, 2019 11:59 PM. If you have any questions, ask your nurse or doctor.        glycopyrrolate 2 MG tablet Commonly known as: ROBINUL TAKE  1/2 TO 1 TABLET BY MOUTH THREE TIMES DAILY AS NEEDED FOR DROOLING   lactulose 10 GM/15ML solution Commonly known as: CHRONULAC TAKE 1 TEASPOONFUL(5 ML) BY MOUTH TWICE DAILY AS NEEDED FOR CONSTIPATION   lamoTRIgine 25 MG Chew chewable tablet Commonly known as: LAMICTAL CHEW AND SWALLOW 1 TABLET BY MOUTH EVERY MORNING AND 2 TABLETS AT BEDTIME.   loratadine 10 MG tablet Commonly known as: CLARITIN 1 tab per G-tube daily   loratadine-pseudoephedrine 10-240 MG 24 hr tablet Commonly known as: CLARITIN-D 24-hour TAKE 1 TABLET BY MOUTH EVERY DAY   norgestrel-ethinyl estradiol 0.3-30 MG-MCG tablet Commonly known as: LO/OVRAL TAKE 1 TABLET BY MOUTH EVERY DAY AS DIRECTED   valproic acid 250 MG/5ML solution Commonly known as: DEPAKENE TAKE 5 ML BY MOUTH TWICE DAILY        Total time spent with the patient was 15 minutes, of which 50% or more was spent in counseling and coordination of care.  [name] NP-C [facility] Ph. [phone] Fax [phone]

## 2020-03-26 ENCOUNTER — Observation Stay (HOSPITAL_COMMUNITY): Payer: Medicaid Other

## 2020-03-26 ENCOUNTER — Encounter (HOSPITAL_COMMUNITY): Payer: Self-pay | Admitting: Emergency Medicine

## 2020-03-26 ENCOUNTER — Emergency Department (HOSPITAL_COMMUNITY): Payer: Medicaid Other

## 2020-03-26 ENCOUNTER — Other Ambulatory Visit: Payer: Self-pay

## 2020-03-26 ENCOUNTER — Inpatient Hospital Stay (HOSPITAL_COMMUNITY)
Admission: EM | Admit: 2020-03-26 | Discharge: 2020-04-04 | DRG: 070 | Disposition: A | Discharge status: Home / Self Care | Payer: Medicaid Other | Attending: Internal Medicine | Admitting: Internal Medicine

## 2020-03-26 DIAGNOSIS — Q751 Craniofacial dysostosis: Secondary | ICD-10-CM | POA: Diagnosis not present

## 2020-03-26 DIAGNOSIS — R197 Diarrhea, unspecified: Secondary | ICD-10-CM | POA: Diagnosis not present

## 2020-03-26 DIAGNOSIS — R4182 Altered mental status, unspecified: Secondary | ICD-10-CM | POA: Diagnosis present

## 2020-03-26 DIAGNOSIS — Z982 Presence of cerebrospinal fluid drainage device: Secondary | ICD-10-CM

## 2020-03-26 DIAGNOSIS — I10 Essential (primary) hypertension: Secondary | ICD-10-CM | POA: Diagnosis present

## 2020-03-26 DIAGNOSIS — E7220 Disorder of urea cycle metabolism, unspecified: Secondary | ICD-10-CM

## 2020-03-26 DIAGNOSIS — F72 Severe intellectual disabilities: Secondary | ICD-10-CM | POA: Diagnosis present

## 2020-03-26 DIAGNOSIS — G40309 Generalized idiopathic epilepsy and epileptic syndromes, not intractable, without status epilepticus: Secondary | ICD-10-CM

## 2020-03-26 DIAGNOSIS — E872 Acidosis, unspecified: Secondary | ICD-10-CM | POA: Diagnosis present

## 2020-03-26 DIAGNOSIS — G40409 Other generalized epilepsy and epileptic syndromes, not intractable, without status epilepticus: Secondary | ICD-10-CM | POA: Diagnosis present

## 2020-03-26 DIAGNOSIS — Z20822 Contact with and (suspected) exposure to covid-19: Secondary | ICD-10-CM | POA: Diagnosis present

## 2020-03-26 DIAGNOSIS — I160 Hypertensive urgency: Secondary | ICD-10-CM

## 2020-03-26 DIAGNOSIS — H109 Unspecified conjunctivitis: Secondary | ICD-10-CM | POA: Diagnosis present

## 2020-03-26 DIAGNOSIS — K0889 Other specified disorders of teeth and supporting structures: Secondary | ICD-10-CM | POA: Diagnosis present

## 2020-03-26 DIAGNOSIS — G9341 Metabolic encephalopathy: Principal | ICD-10-CM | POA: Diagnosis present

## 2020-03-26 DIAGNOSIS — Z841 Family history of disorders of kidney and ureter: Secondary | ICD-10-CM

## 2020-03-26 DIAGNOSIS — Z79899 Other long term (current) drug therapy: Secondary | ICD-10-CM

## 2020-03-26 DIAGNOSIS — Q750 Craniosynostosis: Secondary | ICD-10-CM

## 2020-03-26 DIAGNOSIS — Q743 Arthrogryposis multiplex congenita: Secondary | ICD-10-CM

## 2020-03-26 DIAGNOSIS — G800 Spastic quadriplegic cerebral palsy: Secondary | ICD-10-CM | POA: Diagnosis present

## 2020-03-26 DIAGNOSIS — R6510 Systemic inflammatory response syndrome (SIRS) of non-infectious origin without acute organ dysfunction: Secondary | ICD-10-CM

## 2020-03-26 DIAGNOSIS — Z793 Long term (current) use of hormonal contraceptives: Secondary | ICD-10-CM

## 2020-03-26 DIAGNOSIS — Z931 Gastrostomy status: Secondary | ICD-10-CM

## 2020-03-26 DIAGNOSIS — H0520 Unspecified exophthalmos: Secondary | ICD-10-CM | POA: Diagnosis present

## 2020-03-26 LAB — RAPID URINE DRUG SCREEN, HOSP PERFORMED
Amphetamines: NOT DETECTED
Barbiturates: NOT DETECTED
Benzodiazepines: NOT DETECTED
Cocaine: NOT DETECTED
Opiates: NOT DETECTED
Tetrahydrocannabinol: NOT DETECTED

## 2020-03-26 LAB — I-STAT CHEM 8, ED
BUN: 14 mg/dL (ref 6–20)
Calcium, Ion: 1.1 mmol/L — ABNORMAL LOW (ref 1.15–1.40)
Chloride: 90 mmol/L — ABNORMAL LOW (ref 98–111)
Creatinine, Ser: 0.5 mg/dL (ref 0.44–1.00)
Glucose, Bld: 161 mg/dL — ABNORMAL HIGH (ref 70–99)
HCT: 50 % — ABNORMAL HIGH (ref 36.0–46.0)
Hemoglobin: 17 g/dL — ABNORMAL HIGH (ref 12.0–15.0)
Potassium: 4.3 mmol/L (ref 3.5–5.1)
Sodium: 129 mmol/L — ABNORMAL LOW (ref 135–145)
TCO2: 30 mmol/L (ref 22–32)

## 2020-03-26 LAB — CBC WITH DIFFERENTIAL/PLATELET
Abs Immature Granulocytes: 0.06 10*3/uL (ref 0.00–0.07)
Basophils Absolute: 0 10*3/uL (ref 0.0–0.1)
Basophils Relative: 0 %
Eosinophils Absolute: 0 10*3/uL (ref 0.0–0.5)
Eosinophils Relative: 0 %
HCT: 46.9 % — ABNORMAL HIGH (ref 36.0–46.0)
Hemoglobin: 15.2 g/dL — ABNORMAL HIGH (ref 12.0–15.0)
Immature Granulocytes: 1 %
Lymphocytes Relative: 18 %
Lymphs Abs: 1.7 10*3/uL (ref 0.7–4.0)
MCH: 28.4 pg (ref 26.0–34.0)
MCHC: 32.4 g/dL (ref 30.0–36.0)
MCV: 87.5 fL (ref 80.0–100.0)
Monocytes Absolute: 1 10*3/uL (ref 0.1–1.0)
Monocytes Relative: 11 %
Neutro Abs: 6.3 10*3/uL (ref 1.7–7.7)
Neutrophils Relative %: 70 %
Platelets: 225 10*3/uL (ref 150–400)
RBC: 5.36 MIL/uL — ABNORMAL HIGH (ref 3.87–5.11)
RDW: 12.4 % (ref 11.5–15.5)
WBC: 9.1 10*3/uL (ref 4.0–10.5)
nRBC: 0 % (ref 0.0–0.2)

## 2020-03-26 LAB — VALPROIC ACID LEVEL: Valproic Acid Lvl: 75 ug/mL (ref 50.0–100.0)

## 2020-03-26 LAB — SALICYLATE LEVEL: Salicylate Lvl: <7 mg/dL — ABNORMAL LOW (ref 7.0–30.0)

## 2020-03-26 LAB — TYPE AND SCREEN
ABO/RH(D): B POS
Antibody Screen: NEGATIVE

## 2020-03-26 LAB — ACETAMINOPHEN LEVEL: Acetaminophen (Tylenol), Serum: <10 ug/mL — ABNORMAL LOW (ref 10–30)

## 2020-03-26 LAB — URINALYSIS, ROUTINE W REFLEX MICROSCOPIC
Bilirubin Urine: NEGATIVE
Glucose, UA: 50 mg/dL — AB
Hgb urine dipstick: NEGATIVE
Ketones, ur: 5 mg/dL — AB
Leukocytes,Ua: NEGATIVE
Nitrite: NEGATIVE
Protein, ur: 100 mg/dL — AB
Specific Gravity, Urine: 1.023 (ref 1.005–1.030)
pH: 8 (ref 5.0–8.0)

## 2020-03-26 LAB — COMPREHENSIVE METABOLIC PANEL
ALT: 145 U/L — ABNORMAL HIGH (ref 0–44)
AST: 90 U/L — ABNORMAL HIGH (ref 15–41)
Albumin: 3.7 g/dL (ref 3.5–5.0)
Alkaline Phosphatase: 44 U/L (ref 38–126)
Anion gap: 8 (ref 5–15)
BUN: 15 mg/dL (ref 6–20)
CO2: 28 mmol/L (ref 22–32)
Calcium: 8.7 mg/dL — ABNORMAL LOW (ref 8.9–10.3)
Chloride: 92 mmol/L — ABNORMAL LOW (ref 98–111)
Creatinine, Ser: 0.47 mg/dL (ref 0.44–1.00)
GFR calc Af Amer: >60 mL/min (ref >60)
GFR calc non Af Amer: >60 mL/min (ref >60)
Glucose, Bld: 164 mg/dL — ABNORMAL HIGH (ref 70–99)
Potassium: 4.2 mmol/L (ref 3.5–5.1)
Sodium: 128 mmol/L — ABNORMAL LOW (ref 135–145)
Total Bilirubin: 0.4 mg/dL (ref 0.3–1.2)
Total Protein: 7.5 g/dL (ref 6.5–8.1)

## 2020-03-26 LAB — AMMONIA: Ammonia: 107 umol/L — ABNORMAL HIGH (ref 9–35)

## 2020-03-26 LAB — TROPONIN I (HIGH SENSITIVITY): Troponin I (High Sensitivity): 46 ng/L — ABNORMAL HIGH (ref <18)

## 2020-03-26 LAB — C-REACTIVE PROTEIN: CRP: 0.8 mg/dL (ref <1.0)

## 2020-03-26 LAB — T4, FREE: Free T4: 0.9 ng/dL (ref 0.61–1.12)

## 2020-03-26 LAB — RESPIRATORY PANEL BY RT PCR (FLU A&B, COVID)
Influenza A by PCR: NEGATIVE
Influenza B by PCR: NEGATIVE
SARS Coronavirus 2 by RT PCR: NEGATIVE

## 2020-03-26 LAB — CBG MONITORING, ED: Glucose-Capillary: 147 mg/dL — ABNORMAL HIGH (ref 70–99)

## 2020-03-26 LAB — LACTIC ACID, PLASMA
Lactic Acid, Venous: 1.6 mmol/L (ref 0.5–1.9)
Lactic Acid, Venous: 2.4 mmol/L (ref 0.5–1.9)

## 2020-03-26 LAB — TSH: TSH: 3.483 u[IU]/mL (ref 0.350–4.500)

## 2020-03-26 LAB — PROTIME-INR
INR: 1.2 (ref 0.8–1.2)
Prothrombin Time: 14.4 seconds (ref 11.4–15.2)

## 2020-03-26 LAB — ETHANOL: Alcohol, Ethyl (B): <10 mg/dL (ref <10)

## 2020-03-26 MED ORDER — SODIUM CHLORIDE (PF) 0.9 % IJ SOLN
INTRAMUSCULAR | Status: AC
  Filled 2020-03-26: qty 50

## 2020-03-26 MED ORDER — SODIUM CHLORIDE 0.9 % IV SOLN
2.0000 g | Freq: Three times a day (TID) | INTRAVENOUS | Status: DC
  Administered 2020-03-26 – 2020-03-30 (×11): 2 g via INTRAVENOUS
  Filled 2020-03-26 (×11): qty 2

## 2020-03-26 MED ORDER — IOHEXOL 300 MG/ML  SOLN
75.0000 mL | Freq: Once | INTRAMUSCULAR | Status: AC | PRN
  Administered 2020-03-26: 75 mL via INTRAVENOUS

## 2020-03-26 MED ORDER — LAMOTRIGINE 25 MG PO TABS
25.0000 mg | ORAL_TABLET | Freq: Every day | ORAL | Status: DC
  Administered 2020-03-27 – 2020-03-30 (×4): 25 mg via ORAL
  Filled 2020-03-26 (×4): qty 1

## 2020-03-26 MED ORDER — LAMOTRIGINE 25 MG PO TABS
50.0000 mg | ORAL_TABLET | Freq: Every day | ORAL | Status: DC
  Administered 2020-03-27 – 2020-04-03 (×8): 50 mg via ORAL
  Filled 2020-03-26 (×8): qty 2

## 2020-03-26 MED ORDER — LACTATED RINGERS IV BOLUS
1000.0000 mL | Freq: Once | INTRAVENOUS | Status: AC
  Administered 2020-03-26: 1000 mL via INTRAVENOUS

## 2020-03-26 MED ORDER — LACTATED RINGERS IV SOLN: INTRAVENOUS | Status: DC

## 2020-03-26 MED ORDER — POLYMYXIN B-TRIMETHOPRIM 10000-0.1 UNIT/ML-% OP SOLN
2.0000 [drp] | Freq: Four times a day (QID) | OPHTHALMIC | Status: DC
  Administered 2020-03-27 – 2020-04-04 (×33): 2 [drp] via OPHTHALMIC
  Filled 2020-03-26 (×2): qty 10

## 2020-03-26 MED ORDER — VANCOMYCIN HCL IN DEXTROSE 1-5 GM/200ML-% IV SOLN
1000.0000 mg | INTRAVENOUS | Status: DC
  Administered 2020-03-26 – 2020-03-31 (×6): 1000 mg via INTRAVENOUS
  Filled 2020-03-26 (×6): qty 200

## 2020-03-26 MED ORDER — LACTULOSE 10 GM/15ML PO SOLN
30.0000 g | Freq: Three times a day (TID) | ORAL | Status: DC
  Administered 2020-03-27 – 2020-04-04 (×18): 30 g
  Filled 2020-03-26 (×6): qty 45
  Filled 2020-03-26 (×2): qty 60
  Filled 2020-03-26 (×5): qty 45
  Filled 2020-03-26: qty 60
  Filled 2020-03-26 (×6): qty 45

## 2020-03-26 MED ORDER — OSMOLITE 1.2 CAL PO LIQD: 1000.0000 mL | ORAL | Status: DC

## 2020-03-26 MED ORDER — LEVETIRACETAM IN NACL 1000 MG/100ML IV SOLN
1000.0000 mg | Freq: Two times a day (BID) | INTRAVENOUS | Status: DC
  Administered 2020-03-26 – 2020-03-29 (×7): 1000 mg via INTRAVENOUS
  Filled 2020-03-26 (×8): qty 100

## 2020-03-26 NOTE — ED Provider Notes (Signed)
[facility]-EMERGENCY DEPT Provider Note   CSN: [id number] Arrival date & time: 03/26/20  1546  LEVEL 5 CAVEAT - NONVERBAL History Chief Complaint  Patient presents with  . Fatigue  . Facial Swelling    Alyssa Wong is a 29 y.o. female.  HPI 29 year old female presents with mom for altered mental status/lethargy.  Noticed it yesterday when she was trying to give her a shower.  The patient is nonverbal at baseline but typically can take some steps and participate in shower or other activities.  Did not help at all.  She later in bed in 1 position and when she went to check on the patient this morning, she was in the same position.  Typically the patient will move around in the bed some.  No vomiting, fever, or cough.  She has a VP shunt but has had no issues of this before.  Meds seem to be tolerated through her G-tube.  No new medicines or change in medicines.  Has never had this before.  She is also noticing some left periorbital swelling since yesterday that is new. This type of lethargy is similar to what the patient is like after a seizure, but no seizure activity has been seen and this typically only lasts for a couple hours.  Past Medical History:  Diagnosis Date  . Arthrogryposis multiplex congenita   . Contractures involving both knees   . Crouzon syndrome   . Intellectual delay   . Seizures (HCC)   . 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 VP (ventriculoperitoneal) shunt status     Patient Active Problem List   Diagnosis Date Noted  . SIRS (systemic inflammatory response syndrome) (HCC) 03/26/2020  . Altered mental status 03/26/2020  . Hypertensive urgency 03/26/2020  . Status post insertion of percutaneous endoscopic gastrostomy (PEG) tube (HCC) 03/26/2020  . Hyperammonemia (HCC) 03/26/2020  . Lactic acidosis 03/26/2020  . Loss of weight 07/17/2018  . 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 Drooling 03/04/2017  .  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 Encounter for long-term (current) use of other medications 12/29/2013  . 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 Intellectual delay   . S/P VP shunt   . Arthrogryposis multiplex congenita   . Crouzon syndrome   . Contractures involving both knees     Past Surgical History:  Procedure Laterality Date  . ELBOW SURGERY Left    Release of contractures  . GASTROSTOMY TUBE PLACEMENT    . HIP SURGERY       OB History   No obstetric history on file.     Family History  Problem Relation Age of Onset  . Kidney disease Maternal Grandfather        Died at 48    Social History   Tobacco Use  . Smoking status: Never Smoker  . Smokeless tobacco: Never Used  Substance Use Topics  . Alcohol use: No  . Drug use: No    Home Medications Prior to Admission medications   Medication Sig Start Date End Date Taking? Authorizing Provider  glycopyrrolate (ROBINUL) 2 MG tablet TAKE 1/2 TO 1 TABLET BY MOUTH THREE TIMES DAILY AS NEEDED FOR DROOLING Patient taking differently: Take 3 mg by mouth 2 (two) times daily.  07/17/18  Yes [name], NP  lactulose (CHRONULAC) 10 GM/15ML solution Take 3.4 g by mouth 2 (two) times daily as needed for mild constipation or moderate constipation. Take 62mL by  mouth twice daily as needed for constipation 05/13/16  Yes [provider]  lamoTRIgine (LAMICTAL) 25 MG CHEW chewable tablet CHEW AND SWALLOW 1 TABLET BY MOUTH EVERY MORNING AND 2 TABLETS AT BEDTIME. Patient taking differently: Chew 25-50 mg by mouth See admin instructions. CHEW AND SWALLOW 1 TABLET BY MOUTH EVERY MORNING AND 2 TABLETS AT BEDTIME. Chew and swallow 1 tablet (25mg ) by mouth every morning and 2 tablets (50mg ) by mouth at bedtime. 09/24/19  Yes , NP  levonorgestrel-ethinyl estradiol (SEASONALE) 0.15-0.03 MG tablet Take 1 tablet by mouth daily. 01/04/20  Yes [provider]    Naphazoline-Pheniramine (OPCON-A OP) Apply 1-2 drops to eye every 6 (six) hours as needed (eye redness).   Yes [provider]  valproic acid (DEPAKENE) 250 MG/5ML solution TAKE 5 ML BY MOUTH TWICE DAILY Patient taking differently: Take 250 mg by mouth in the morning and at bedtime. Take 59mL (250mg ) by mouth twice daily 09/24/19  Yes 4m, NP    Allergies    Patient has no known allergies.  Review of Systems   Review of Systems  Unable to perform ROS: Patient nonverbal    Physical Exam Updated Vital Signs BP (!) 184/125   Pulse (!) 138   Temp 100.1 F (37.8 C) (Rectal)   Resp 20   LMP  (LMP Unknown)   SpO2 98%   Physical Exam Vitals and nursing note reviewed.  Constitutional:      Appearance: She is well-developed.  HENT:     Head: Normocephalic and atraumatic.     Comments: Abnormal facies from Crouzon syndrome    Right Ear: External ear normal.     Left Ear: External ear normal.     Nose: Nose normal.  Eyes:     General:        Right eye: No discharge.        Left eye: No discharge.     Pupils: Pupils are equal, round, and reactive to light.     Comments: When eyes are opened the globes will move and seems to try to close eyelids Left periorbital region is somewhat swollen. There is swelling to inferior lower lid. No erythema Bilateral proptosis (left>right) which seems baseline to mom  Cardiovascular:     Rate and Rhythm: Regular rhythm. Tachycardia present.     Heart sounds: Normal heart sounds.  Pulmonary:     Effort: Pulmonary effort is normal.     Breath sounds: Normal breath sounds. No wheezing, rhonchi or rales.  Abdominal:     Palpations: Abdomen is soft.     Tenderness: There is no abdominal tenderness.     Comments: Mickey button in place without signs of infection  Skin:    General: Skin is warm and dry.  Neurological:     Mental Status: She is lethargic.  Psychiatric:        Mood and Affect: Mood is not anxious.     ED  Results / Procedures / Treatments   Labs (all labs ordered are listed, but only abnormal results are displayed) Labs Reviewed  COMPREHENSIVE METABOLIC PANEL - Abnormal; Notable for the following components:      Result Value   Sodium 128 (*)    Chloride 92 (*)    Glucose, Bld 164 (*)    Calcium 8.7 (*)    AST 90 (*)    ALT 145 (*)    All other components within normal limits  SALICYLATE LEVEL - Abnormal; Notable for the  following components:   Salicylate Lvl <7.0 (*)    All other components within normal limits  ACETAMINOPHEN LEVEL - Abnormal; Notable for the following components:   Acetaminophen (Tylenol), Serum <10 (*)    All other components within normal limits  CBC WITH DIFFERENTIAL/PLATELET - Abnormal; Notable for the following components:   RBC 5.36 (*)    Hemoglobin 15.2 (*)    HCT 46.9 (*)    All other components within normal limits  AMMONIA - Abnormal; Notable for the following components:   Ammonia 107 (*)    All other components within normal limits  URINALYSIS, ROUTINE W REFLEX MICROSCOPIC - Abnormal; Notable for the following components:   APPearance CLOUDY (*)    Glucose, UA 50 (*)    Ketones, ur 5 (*)    Protein, ur 100 (*)    Bacteria, UA RARE (*)    All other components within normal limits  LACTIC ACID, PLASMA - Abnormal; Notable for the following components:   Lactic Acid, Venous 2.4 (*)    All other components within normal limits  I-STAT CHEM 8, ED - Abnormal; Notable for the following components:   Sodium 129 (*)    Chloride 90 (*)    Glucose, Bld 161 (*)    Calcium, Ion 1.10 (*)    Hemoglobin 17.0 (*)    HCT 50.0 (*)    All other components within normal limits  CBG MONITORING, ED - Abnormal; Notable for the following components:   Glucose-Capillary 147 (*)    All other components within normal limits  TROPONIN I (HIGH SENSITIVITY) - Abnormal; Notable for the following components:   Troponin I (High Sensitivity) 46 (*)    All other components  within normal limits  RESPIRATORY PANEL BY RT PCR (FLU A&B, COVID)  URINE CULTURE  CULTURE, BLOOD (ROUTINE X 2)  CULTURE, BLOOD (ROUTINE X 2)  ETHANOL  PROTIME-INR  RAPID URINE DRUG SCREEN, HOSP PERFORMED  VALPROIC ACID LEVEL  TSH  T4, FREE  LACTIC ACID, PLASMA  C-REACTIVE PROTEIN  CREATININE, SERUM  I-STAT BETA HCG BLOOD, ED ([facility], [facility], [facility] ONLY)  TYPE AND SCREEN  ABO/RH    EKG EKG Interpretation  Date/Time:  Sunday Mar 26 2020 16:24:09 EDT Ventricular Rate:  134 PR Interval:    QRS Duration: 61 QT Interval:  279 QTC Calculation: 417 R Axis:   68 Text Interpretation: Sinus tachycardia LAE, consider biatrial enlargement Minimal ST depression, lateral leads No old tracing to compare Confirmed by Pricilla Loveless [id number]) on 03/26/2020 4:28:59 PM   Radiology CT Head Wo Contrast  Result Date: 03/26/2020 CLINICAL DATA:  Encephalopathy EXAM: CT HEAD WITHOUT CONTRAST TECHNIQUE: Contiguous axial images were obtained from the base of the skull through the vertex without intravenous contrast. COMPARISON:  07/05/2006 FINDINGS: Brain: No evidence of acute infarction, hemorrhage, hydrocephalus, extra-axial collection or mass lesion/mass effect. Right parietal approach intraventricular shunt catheter. No significant change in configuration of the lateral ventricles in comparison to prior CT dated 07/05/2006. Vascular: No hyperdense vessel or unexpected calcification. Skull: Evidence of prior craniotomies. Negative for fracture or focal lesion. Sinuses/Orbits: No acute finding.  Exophthalmos. Other: None. IMPRESSION: 1.  No acute intracranial pathology. 2. Right parietal approach intraventricular shunt catheter. No significant change in configuration of the lateral ventricles in comparison to prior CT dated 07/05/2006. Electronically Signed   By: Lauralyn Primes M.D.   On: 03/26/2020 17:55   DG Chest Portable 1 View  Result Date: 03/26/2020 CLINICAL DATA:  Lethargy since yesterday, LEFT  eye swelling,  decreased ambulation, history of seizures, altered mental status EXAM: PORTABLE CHEST 1 VIEW COMPARISON:  Portable exam 1632 hours compared to 03/30/2007 FINDINGS: Normal heart size, mediastinal contours, and pulmonary vascularity. Rotated to the RIGHT. Shunt tubing seen in the RIGHT cervical and upper thoracic region. When accounting for rotation, no definite pulmonary infiltrate, pleural effusion or pneumothorax identified. Thoracic scoliosis and thoracic cage deformity noted. IMPRESSION: No acute abnormalities. Electronically Signed   By: Ulyses SouthwardMark  Boles M.D.   On: 03/26/2020 16:43   CT Orbits W Contrast  Result Date: 03/26/2020 CLINICAL DATA:  Orbital cellulitis EXAM: CT ORBITS WITH CONTRAST TECHNIQUE: Multidetector CT images was performed according to the standard protocol following intravenous contrast administration. CONTRAST:  75mL OMNIPAQUE IOHEXOL 300 MG/ML  SOLN COMPARISON:  None. FINDINGS: Orbits: Bilateral exophthalmos. No traumatic or inflammatory finding. Globes, optic nerves, orbital fat, extraocular muscles, vascular structures, and lacrimal glands are normal. Visualized sinuses: Clear. Soft tissues: Negative. Limited intracranial: No significant or unexpected finding. IMPRESSION: 1. No inflammatory findings of the orbits. No evidence of orbital cellulitis. 2. Bilateral exophthalmos. Electronically Signed   By: Lauralyn PrimesAlex  Bibbey M.D.   On: 03/26/2020 18:08    Procedures .Critical Care Performed by: Pricilla LovelessGoldston, Jayquan Bradsher, MD Authorized by: Pricilla LovelessGoldston, Matalynn Graff, MD   Critical care provider statement:    Critical care time (minutes):  35   Critical care time was exclusive of:  Separately billable procedures and treating other patients   Critical care was necessary to treat or prevent imminent or life-threatening deterioration of the following conditions:  CNS failure or compromise   Critical care was time spent personally by me on the following activities:  Discussions with consultants, evaluation of  patient's response to treatment, examination of patient, ordering and performing treatments and interventions, ordering and review of laboratory studies, ordering and review of radiographic studies, pulse oximetry, re-evaluation of patient's condition, obtaining history from patient or surrogate and review of old charts   (including critical care time)  Medications Ordered in ED Medications  sodium chloride (PF) 0.9 % injection (has no administration in time range)  lactated ringers infusion (has no administration in time range)  lactulose (CHRONULAC) 10 GM/15ML solution 30 g (has no administration in time range)  trimethoprim-polymyxin b (POLYTRIM) ophthalmic solution 2 drop (has no administration in time range)  ceFEPIme (MAXIPIME) 2 g in sodium chloride 0.9 % 100 mL IVPB (has no administration in time range)  vancomycin (VANCOCIN) IVPB 1000 mg/200 mL premix (has no administration in time range)  sodium chloride (PF) 0.9 % injection (has no administration in time range)  levETIRAcetam (KEPPRA) IVPB 1000 mg/100 mL premix (has no administration in time range)  lactated ringers bolus 1,000 mL (0 mLs Intravenous Stopped 03/26/20 1818)  iohexol (OMNIPAQUE) 300 MG/ML solution 75 mL (75 mLs Intravenous Contrast Given 03/26/20 1728)  lactated ringers bolus 1,000 mL (1,000 mLs Intravenous New Bag/Given 03/26/20 1950)  iohexol (OMNIPAQUE) 300 MG/ML solution 75 mL (75 mLs Intravenous Contrast Given 03/26/20 2128)    ED Course  I have reviewed the triage vital signs and the nursing notes.  Pertinent labs & imaging results that were available during my care of the patient were reviewed by me and considered in my medical decision making (see chart for details).    MDM Rules/Calculators/A&P                      Unclear exact cause of the patient's altered mental status.  She has a low-grade temperature but  not quite a fever.  WBC is normal.  The patient is definitely altered from her baseline.  Question  whether she is having subclinical seizures and neurology was consulted.  CT head is not quite normal but unclear if there is artifact.  Initially when discussing with Dr. Lorraine Lax, there is concerned that this might be large stroke.  Last known normal yesterday.  I also discussed with Dr. Leonel Ramsay, who has seen patient.  Could be the ammonia elevation which is probably from Depakote.  Lactulose will be ordered.  We discussed whether or not CSF should be obtained but Dr. Leonel Ramsay feels like MRI with and without contrast first.  This is in case there is cerebritis.  She does remain tachycardic, and after discussion with hospitalist we have decided to put her on antibiotics.  Unclear what is exactly causing her eyelid swelling except for possibly a focal scleritis.  She is complicated and will need admission for further work-up and monitoring and treatment. Final Clinical Impression(s) / ED Diagnoses Final diagnoses:  Altered mental status, unspecified altered mental status type    Rx / DC Orders ED Discharge Orders    None       Sherwood Gambler, MD 03/26/20 2324

## 2020-03-26 NOTE — ED Notes (Signed)
Caregiver at bedside

## 2020-03-26 NOTE — Consult Note (Addendum)
Neurology Consultation Reason for Consult: Lethargy Referring Physician: Leafy Half, G  CC: Lethargy  History is obtained from: Mother  HPI: Alyssa Wong is a 29 y.o. 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  She has fairly severe intellectual delay, and is nonverbal at baseline.  She is able to help to stand to some degree, but really is total care.  Over the past 2 days, she has had increased lethargy, inability to cooperate with her activities of daily living, and therefore mother is quite concerned.  On evaluation in the emergency department, she was found to have pneumonia of 100, I do not see any previous ammonia levels.  She typically takes Lamictal and valproic acid for her seizures.   ROS:  Unable to obtain due to altered mental status.   Past Medical History:  Diagnosis Date  . Arthrogryposis multiplex congenita   . Contractures involving both knees   . Crouzon syndrome   . Intellectual delay   . Seizures (HCC)   . 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 VP (ventriculoperitoneal) shunt status      Family History  Problem Relation Age of Onset  . Kidney disease Maternal Grandfather        Died at 44     Social History:  reports that she has never smoked. She has never used smokeless tobacco. She reports that she does not drink alcohol or use drugs.   Exam: Current vital signs: BP (!) 184/125   Pulse (!) 138   Temp 100.1 F (37.8 C) (Rectal)   Resp 20   LMP  (LMP Unknown)   SpO2 98%  Vital signs in last 24 hours: Temp:  [98.8 F (37.1 C)-100.1 F (37.8 C)] 100.1 F (37.8 C) (05/02 1619) Pulse Rate:  [127-138] 138 (05/02 2100) Resp:  [13-27] 20 (05/02 2100) BP: (155-189)/(99-125) 184/125 (05/02 2100) SpO2:  [88 %-98 %] 98 % (05/02 2100)   Physical Exam  Constitutional: Appears well-developed and well-nourished.  Psych: Affect appropriate to  situation Eyes: Scleral edema with injection of the left eye, proptosis bilaterally. HENT: No OP obstrucion MSK: no joint deformities.  Cardiovascular: Normal rate and regular rhythm.  Respiratory: Effort normal, non-labored breathing GI: Soft.  No distension. There is no tenderness.  Skin: WDI  Neuro: Mental Status: Patient opens eyes somewhat to noxious stimulation, does not follow commands. Cranial Nerves: II: Does not blink to threat, pupils are reactive bilaterally III,IV, VI: She has a right exotropia, tender Goodpaster previously documented disconjugate gaze V: Facial sensation is symmetric to temperature VII: Facial movement is symmetric.  Motor: She has a spastic quadriparesis, withdraws minimally to noxious stimulation bilaterally in the arms, does not move to noxious stimulation of the legs  sensory: As above  deep Tendon Reflexes: 2+ and symmetric in the biceps and patellae.  Cerebellar: Does not perform   I have reviewed labs in epic and the results pertinent to this consultation are: Covid negative VPA 75 TSH normal Ammonia 107 No leukocytosis Sodium 128 Mildly elevated LFTs   I have reviewed the images obtained: CT head-?  Streak artifact versus hypodensity in the right parieto-occipital region  Impression: 29 year old female with history of mental retardation, cerebral palsy, crouzon syndrome, and seizures who presents with increasing lethargy in setting of hyperammonemia on Depakote.  It is not uncommon for Depakote to cause a hyperammonemia, but with an ammonia above 100 and encephalopathy, hyperammonemic encephalopathy due to  Depakote I think has to be considered.  I would also favor getting better imaging of her brain with MRI, though there is question about whether this can be performed due to her VP shunt.  Her eyes injected, if there is a conjunctivitis, infection could be potentially responsible for worsening mental status.  I have low suspicion for  subclinical seizures, but an EEG routinely tomorrow would be reasonable.  Recommendations: 1) consider MRI brain if possible, if not, CT with/without contrast 2) hold Depakote for now, start Keppra 1 g twice daily 3) continue Lamictal, dosing will likely  need to be adjusted given the interaction with Depakote 4) EEG in the AM 5) Will follow.   Roland Rack, MD [facility] [phone]  If 7pm- 7am, please page neurology on call as listed in [place].

## 2020-03-26 NOTE — H&P (Addendum)
History and Physical    TARSHA BLANDO WUJ:[id number] DOB: 03/13/1991 DOA: 03/26/2020  PCP: [name], MD  Patient coming from: Home   Chief Complaint:  Chief Complaint  Patient presents with  . Fatigue  . 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  Patient is nonverbal and profoundly more lethargic than her baseline and therefore the majority of history is been obtained from the mother who is at the bedside.  According to the mother, yesterday morning when she attempted to get the patient out of bed to take a shower she could not walk.  The patient was extremely lethargic minimally arousable and not able to communicate as per her baseline.  In addition, the mother observes that her eyes "are bulging out more than usual" she is particularly concerned about the left eye which has become injected with some fat pad protrusion at that time.  Mother denies any recent sick contacts, recent travel, diarrhea, gestures of new pain, vomiting, intolerance of feeds, shortness of breath or cough.  Despite the patient having a known history of seizure disorder the mother has not witnessed any seizures as of late.  Of note, the patient has not received her COVID-19 vaccination.  Patient symptoms of extreme lethargy have continued to persist and therefore the patient has now been brought in [facility] emergency department for evaluation.   Review of Systems: Unable to perform due to patient being nonverbal.   Past Medical History:  Diagnosis Date  . Arthrogryposis multiplex congenita   . Contractures involving both knees   . Crouzon syndrome     . Intellectual delay   . Seizures (HCC)   . 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 VP (ventriculoperitoneal) shunt status     Past Surgical History:  Procedure Laterality Date  . ELBOW SURGERY Left    Release of contractures  . GASTROSTOMY TUBE PLACEMENT    . HIP SURGERY       reports that she has never smoked. She has never used smokeless tobacco. She reports that she does not drink alcohol or use drugs.  No Known Allergies  Family History  Problem Relation Age of Onset  . Kidney disease Maternal Grandfather        Died at 23     Prior to Admission medications   Medication Sig Start Date End Date Taking? Authorizing Provider  glycopyrrolate (ROBINUL) 2 MG tablet TAKE 1/2 TO 1 TABLET BY MOUTH THREE TIMES DAILY AS NEEDED FOR DROOLING Patient taking differently: Take 3 mg by mouth 2 (two) times daily.  07/17/18  Yes [name], NP  lactulose (CHRONULAC) 10 GM/15ML solution Take 3.4 g by mouth 2 (two) times daily as needed for mild constipation or moderate constipation. Take 22mL by mouth twice daily as needed for constipation 05/13/16  Yes [provider]  lamoTRIgine (LAMICTAL) 25 MG CHEW chewable tablet CHEW AND SWALLOW 1 TABLET BY MOUTH EVERY MORNING AND 2 TABLETS AT BEDTIME. Patient taking differently: Chew 25-50 mg by mouth See admin instructions. CHEW AND SWALLOW 1 TABLET BY MOUTH EVERY MORNING AND 2 TABLETS AT BEDTIME. Chew and swallow 1 tablet (  25mg ) by mouth every morning and 2 tablets (50mg ) by mouth at bedtime. 09/24/19  Yes , NP  levonorgestrel-ethinyl estradiol (SEASONALE) 0.15-0.03 MG tablet Take 1 tablet by mouth daily. 01/04/20  Yes [provider]  Naphazoline-Pheniramine (OPCON-A OP) Apply 1-2 drops to eye every 6 (six) hours as needed (eye redness).   Yes [provider]  valproic acid (DEPAKENE) 250 MG/5ML solution TAKE 5 ML BY MOUTH TWICE DAILY Patient taking differently: Take 250 mg by mouth  in the morning and at bedtime. Take 52mL (250mg ) by mouth twice daily 09/24/19  Yes 4m, NP    Physical Exam: Vitals:   03/26/20 1816 03/26/20 1842 03/26/20 1900 03/26/20 1953  BP:  (!) 166/114 (!) 159/109 (!) 184/110  Pulse: (!) 134 (!) 131 (!) 129 (!) 135  Resp: 14 (!) 27 (!) 21 17  Temp:      TempSrc:      SpO2: 92% 96% 95% 95%    Constitutional: Extremely lethargic, nonverbal. Skin: no rashes, no lesions, good skin turgor noted. Eyes: Pupils are equally reactive to light.  Notable bilateral conjunctival injection with evidence of proptosis and fat pad protrusion of the left eye.  No evidence of discharge. ENMT: Dry mucous membranes with protruding tongue.  Poor dentition noted.  Posterior pharynx clear of any exudate or lesions.  Neck: normal, supple, no masses, no thyromegaly.  No evidence of jugular venous distension.   Respiratory: clear to auscultation bilaterally, no wheezing, no crackles.  Patient is somewhat tachypneic.  No accessory muscle use.  Cardiovascular: Tachycardic rate but regular rhythm no murmurs / rubs / gallops. No extremity edema. 2+ pedal pulses. No carotid bruits.  Chest:   Nontender without crepitus or deformity.   Back:   Nontender without crepitus or deformity. Abdomen: Abdomen is soft and nontender.  PEG site in place with no surrounding erythema or tenderness.  No evidence of intra-abdominal masses.  Positive bowel sounds noted in all quadrants.   Musculoskeletal: Poor muscle tone noted.  No joint deformity upper and lower extremities.   Neurologic: Patient is awake but extremely lethargic and not responding to verbal or painful stimuli.  Patient is not following commands. Psychiatric: Unable to assess due to extreme lethargy.  Patient currently does not possess insight as to her current situation.  Labs on Admission: I have personally reviewed following labs and imaging studies -   CBC: Recent Labs  Lab 03/26/20 1608 03/26/20 1633  WBC  9.1  --   NEUTROABS 6.3  --   HGB 15.2* 17.0*  HCT 46.9* 50.0*  MCV 87.5  --   PLT 225  --    Basic Metabolic Panel: Recent Labs  Lab 03/26/20 1608 03/26/20 1633  NA 128* 129*  K 4.2 4.3  CL 92* 90*  CO2 28  --   GLUCOSE 164* 161*  BUN 15 14  CREATININE 0.47 0.50  CALCIUM 8.7*  --    GFR: CrCl cannot be calculated (Unknown ideal weight.). Liver Function Tests: Recent Labs  Lab 03/26/20 1608  AST 90*  ALT 145*  ALKPHOS 44  BILITOT 0.4  PROT 7.5  ALBUMIN 3.7   No results for input(s): LIPASE, AMYLASE in the last 168 hours. Recent Labs  Lab 03/26/20 1609  AMMONIA 107*   Coagulation Profile: Recent Labs  Lab 03/26/20 1608  INR 1.2   Cardiac Enzymes: No results for input(s): CKTOTAL, CKMB, CKMBINDEX, TROPONINI in the last 168 hours. BNP (last 3 results) No results for input(s): PROBNP  in the last 8760 hours. HbA1C: No results for input(s): HGBA1C in the last 72 hours. CBG: Recent Labs  Lab 03/26/20 1614  GLUCAP 147*   Lipid Profile: No results for input(s): CHOL, HDL, LDLCALC, TRIG, CHOLHDL, LDLDIRECT in the last 72 hours. Thyroid Function Tests: Recent Labs    03/26/20 1611  TSH 3.483  FREET4 0.90   Anemia Panel: No results for input(s): VITAMINB12, FOLATE, FERRITIN, TIBC, IRON, RETICCTPCT in the last 72 hours. Urine analysis:    Component Value Date/Time   COLORURINE YELLOW 03/26/2020 1609   APPEARANCEUR CLOUDY (A) 03/26/2020 1609   LABSPEC 1.023 03/26/2020 1609   PHURINE 8.0 03/26/2020 1609   GLUCOSEU 50 (A) 03/26/2020 1609   HGBUR NEGATIVE 03/26/2020 1609   BILIRUBINUR NEGATIVE 03/26/2020 1609   KETONESUR 5 (A) 03/26/2020 1609   PROTEINUR 100 (A) 03/26/2020 1609   NITRITE NEGATIVE 03/26/2020 1609   LEUKOCYTESUR NEGATIVE 03/26/2020 1609    Radiological Exams on Admission - Personally Reviewed: CT Head Wo Contrast  Result Date: 03/26/2020 CLINICAL DATA:  Encephalopathy EXAM: CT HEAD WITHOUT CONTRAST TECHNIQUE: Contiguous axial images  were obtained from the base of the skull through the vertex without intravenous contrast. COMPARISON:  07/05/2006 FINDINGS: Brain: No evidence of acute infarction, hemorrhage, hydrocephalus, extra-axial collection or mass lesion/mass effect. Right parietal approach intraventricular shunt catheter. No significant change in configuration of the lateral ventricles in comparison to prior CT dated 07/05/2006. Vascular: No hyperdense vessel or unexpected calcification. Skull: Evidence of prior craniotomies. Negative for fracture or focal lesion. Sinuses/Orbits: No acute finding.  Exophthalmos. Other: None. IMPRESSION: 1.  No acute intracranial pathology. 2. Right parietal approach intraventricular shunt catheter. No significant change in configuration of the lateral ventricles in comparison to prior CT dated 07/05/2006. Electronically Signed   By: Lauralyn Primes M.D.   On: 03/26/2020 17:55   DG Chest Portable 1 View  Result Date: 03/26/2020 CLINICAL DATA:  Lethargy since yesterday, LEFT eye swelling, decreased ambulation, history of seizures, altered mental status EXAM: PORTABLE CHEST 1 VIEW COMPARISON:  Portable exam 1632 hours compared to 03/30/2007 FINDINGS: Normal heart size, mediastinal contours, and pulmonary vascularity. Rotated to the RIGHT. Shunt tubing seen in the RIGHT cervical and upper thoracic region. When accounting for rotation, no definite pulmonary infiltrate, pleural effusion or pneumothorax identified. Thoracic scoliosis and thoracic cage deformity noted. IMPRESSION: No acute abnormalities. Electronically Signed   By: Ulyses Southward M.D.   On: 03/26/2020 16:43   CT Orbits W Contrast  Result Date: 03/26/2020 CLINICAL DATA:  Orbital cellulitis EXAM: CT ORBITS WITH CONTRAST TECHNIQUE: Multidetector CT images was performed according to the standard protocol following intravenous contrast administration. CONTRAST:  79mL OMNIPAQUE IOHEXOL 300 MG/ML  SOLN COMPARISON:  None. FINDINGS: Orbits: Bilateral  exophthalmos. No traumatic or inflammatory finding. Globes, optic nerves, orbital fat, extraocular muscles, vascular structures, and lacrimal glands are normal. Visualized sinuses: Clear. Soft tissues: Negative. Limited intracranial: No significant or unexpected finding. IMPRESSION: 1. No inflammatory findings of the orbits. No evidence of orbital cellulitis. 2. Bilateral exophthalmos. Electronically Signed   By: Lauralyn Primes M.D.   On: 03/26/2020 18:08    EKG: Personally reviewed.  Rhythm is sinus tachycardia with heart rate of 134 bpm.  No dynamic ST segment changes appreciated.  Assessment/Plan   Altered mental status   2-day history of extreme lethargy and minimal responsiveness a dramatic change from her baseline.  Presence of Crouzon syndrome makes assessment extremely difficult.  Change in status is based on the mother's account.  Sudden  onset of extreme lethargy brings about concern for possible underlying infection, status epilepticus considering history of seizure disorder or possible stroke.  No obvious evidence of infection at this point.  Per discussions with neurology, patient to undergo MRI brain tonight which will identify any evidence of stroke versus possible infectious process versus possible seizure - I appreciate their input and recommendations.  With holding home regimen of seizure medications at this time specifically in light of hyperammonemia -neurology has stated they will transition the patient over to Keppra and will place the orders themselves   Aactive Problems:   SIRS (systemic inflammatory response syndrome) (HCC)   Patient presenting with multiple sirs criteria including tachycardia and tachypnea  Patient also exhibiting mild fever on arrival  Possibly early sepsis, source unclear -possible left eye bacterial conjunctivitis or even possibly CNS infection.  Urinalysis unremarkable.  Chest x-ray reveals no evidence of infection.   Per emergency  department provider discussion with neurology (Dr. Amada JupiterKirkpatrick) they recommended awaiting results of MRI brain to be performed tonight before initiating treatment for suspected infection, stroke or otherwise.  Treating patient for possible left bacterial conjunctivitis with left Polytrim eyedrops  Treating patient for possible early sepsis with broad-spectrum intravenous antibiotic therapy with cefepime and vancomycin.  Blood cultures obtained and pending.  COVID-19 testing pending.     Hypertensive urgency   Due to the possibility of stroke in this patient will allow for "permissive hypertension" in this patient at this time  Monitoring blood pressures closely  According to the mother patient has no history of hypertension.    Lactic acidosis   Mild lactic acidosis of 2.4, concerning for volume depletion or possible superimposed infection  Hydrate patient with intravenous isotonic fluids, working patient up for infection as noted above  Initiating broad-spectrum intravenous antibiotic therapy  Performing serial lactic acid levels to ensure downtrending and resolution    S/P VP shunt   Presence of VP shunt things of concern for possible infection in this patient with multiple sirs criteria and lactic acidosis  Awaiting MRI brain and neurology evaluation -recommendations are appreciated    Status post insertion of percutaneous endoscopic gastrostomy (PEG) tube (HCC)   Due to tenuous clinical status temporarily holding tube feeds at this time  According to the mother, she currently provides patient with 6 cans of Osmolite daily.  1 can every 2 hours while awake.    Hyperammonemia [facility](HCC)   Per emergency department provider discussion with neurology, hyperammonemia is likely secondary to valproic acid use  Per discussion with neurology, patient to be transitioned over to Keppra, they will place orders.  They also recommended initiating lactulose in case hyperammonemia  is contributing to patient's encephalopathy - will administer via PEG.    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  Dispo: The patient is from: Home              Anticipated d/c is to: Home              Anticipated d/c date is: 2 days              Patient currently is not medically stable to d/c.  Vernelle Emerald MD [facility] Pager [phone]  If 7PM-7AM, please contact night-coverage [email or web address] Use universal  password for that web site. If you do not have the password, please call the hospital operator.  03/26/2020, 8:20 PM

## 2020-03-26 NOTE — ED Notes (Signed)
Spoke with Shalhoub, MD regarding pt BP and HR. Per MD monitor at this time not treating BP unless 220 or greater not treating HR unless rhythm change.

## 2020-03-26 NOTE — ED Notes (Signed)
Pt transported to CT ?

## 2020-03-26 NOTE — Sepsis Progress Note (Signed)
Lactic acid rose from 1.6 to 2.4 despite appropriate amount of IVF given. Ordered Repeat lactic acid to be drawn based on Dr Elinor Parkinson order to repeat if >2. Alerted bedside nurse of order and she is going to draw it before transfer to floor.

## 2020-03-26 NOTE — ED Triage Notes (Signed)
Caregiver states that since around noon yesterday patient been lethargic, having swollen left eye. Normally patient assist walking to restroom and restless, but not moving around or at her baseline. Reports that similar to when she has seizures and post-ictal for a say afterwards, but doesn't recall patient having seizure yesterday.

## 2020-03-26 NOTE — ED Notes (Signed)
Admitting MD made aware or BP and HR.

## 2020-03-26 NOTE — Progress Notes (Signed)
Pharmacy Antibiotic Note  Alyssa Wong is a 29 y.o. female admitted on 03/26/2020 with sepsis.  Pharmacy has been consulted for vanc dosing.  Plan: Vancomycin 1g x 1 then 1g IV q24 - goal AUC 400-550      Temp (24hrs), Avg:99.5 F (37.5 C), Min:98.8 F (37.1 C), Max:100.1 F (37.8 C)  Recent Labs  Lab 03/26/20 1608 03/26/20 1625 03/26/20 1633 03/26/20 1825  WBC 9.1  --   --   --   CREATININE 0.47  --  0.50  --   LATICACIDVEN  --  1.6  --  2.4*    CrCl cannot be calculated (Unknown ideal weight.).    No Known Allergies   Thank you for allowing pharmacy to be a part of this patient's care.  Berkley Harvey 03/26/2020 8:59 PM

## 2020-03-27 DIAGNOSIS — Z793 Long term (current) use of hormonal contraceptives: Secondary | ICD-10-CM | POA: Diagnosis not present

## 2020-03-27 DIAGNOSIS — Z79899 Other long term (current) drug therapy: Secondary | ICD-10-CM | POA: Diagnosis not present

## 2020-03-27 DIAGNOSIS — R4182 Altered mental status, unspecified: Secondary | ICD-10-CM | POA: Diagnosis present

## 2020-03-27 DIAGNOSIS — F72 Severe intellectual disabilities: Secondary | ICD-10-CM | POA: Diagnosis present

## 2020-03-27 DIAGNOSIS — K0889 Other specified disorders of teeth and supporting structures: Secondary | ICD-10-CM | POA: Diagnosis present

## 2020-03-27 DIAGNOSIS — Q743 Arthrogryposis multiplex congenita: Secondary | ICD-10-CM | POA: Diagnosis not present

## 2020-03-27 DIAGNOSIS — Q750 Craniosynostosis: Secondary | ICD-10-CM | POA: Diagnosis not present

## 2020-03-27 DIAGNOSIS — Z982 Presence of cerebrospinal fluid drainage device: Secondary | ICD-10-CM | POA: Diagnosis not present

## 2020-03-27 DIAGNOSIS — E872 Acidosis: Secondary | ICD-10-CM | POA: Diagnosis present

## 2020-03-27 DIAGNOSIS — G9341 Metabolic encephalopathy: Secondary | ICD-10-CM | POA: Diagnosis present

## 2020-03-27 DIAGNOSIS — Z931 Gastrostomy status: Secondary | ICD-10-CM

## 2020-03-27 DIAGNOSIS — I160 Hypertensive urgency: Secondary | ICD-10-CM | POA: Diagnosis present

## 2020-03-27 DIAGNOSIS — Q751 Craniofacial dysostosis: Secondary | ICD-10-CM | POA: Diagnosis not present

## 2020-03-27 DIAGNOSIS — H0520 Unspecified exophthalmos: Secondary | ICD-10-CM | POA: Diagnosis present

## 2020-03-27 DIAGNOSIS — E7220 Disorder of urea cycle metabolism, unspecified: Secondary | ICD-10-CM | POA: Diagnosis present

## 2020-03-27 DIAGNOSIS — I10 Essential (primary) hypertension: Secondary | ICD-10-CM | POA: Diagnosis present

## 2020-03-27 DIAGNOSIS — Z20822 Contact with and (suspected) exposure to covid-19: Secondary | ICD-10-CM | POA: Diagnosis present

## 2020-03-27 DIAGNOSIS — G800 Spastic quadriplegic cerebral palsy: Secondary | ICD-10-CM | POA: Diagnosis present

## 2020-03-27 DIAGNOSIS — G40409 Other generalized epilepsy and epileptic syndromes, not intractable, without status epilepticus: Secondary | ICD-10-CM | POA: Diagnosis present

## 2020-03-27 DIAGNOSIS — R197 Diarrhea, unspecified: Secondary | ICD-10-CM | POA: Diagnosis not present

## 2020-03-27 DIAGNOSIS — Z841 Family history of disorders of kidney and ureter: Secondary | ICD-10-CM | POA: Diagnosis not present

## 2020-03-27 DIAGNOSIS — H109 Unspecified conjunctivitis: Secondary | ICD-10-CM | POA: Diagnosis present

## 2020-03-27 LAB — LIPID PANEL
Cholesterol: 89 mg/dL (ref 0–200)
HDL: 38 mg/dL — ABNORMAL LOW (ref >40)
LDL Cholesterol: 45 mg/dL (ref 0–99)
Total CHOL/HDL Ratio: 2.3 RATIO
Triglycerides: 29 mg/dL (ref <150)
VLDL: 6 mg/dL (ref 0–40)

## 2020-03-27 LAB — CBC WITH DIFFERENTIAL/PLATELET
Abs Immature Granulocytes: 0.05 10*3/uL (ref 0.00–0.07)
Basophils Absolute: 0 10*3/uL (ref 0.0–0.1)
Basophils Relative: 0 %
Eosinophils Absolute: 0 10*3/uL (ref 0.0–0.5)
Eosinophils Relative: 0 %
HCT: 48.7 % — ABNORMAL HIGH (ref 36.0–46.0)
Hemoglobin: 16.1 g/dL — ABNORMAL HIGH (ref 12.0–15.0)
Immature Granulocytes: 0 %
Lymphocytes Relative: 25 %
Lymphs Abs: 2.9 10*3/uL (ref 0.7–4.0)
MCH: 28.4 pg (ref 26.0–34.0)
MCHC: 33.1 g/dL (ref 30.0–36.0)
MCV: 86 fL (ref 80.0–100.0)
Monocytes Absolute: 1.5 10*3/uL — ABNORMAL HIGH (ref 0.1–1.0)
Monocytes Relative: 13 %
Neutro Abs: 7.1 10*3/uL (ref 1.7–7.7)
Neutrophils Relative %: 62 %
Platelets: 256 10*3/uL (ref 150–400)
RBC: 5.66 MIL/uL — ABNORMAL HIGH (ref 3.87–5.11)
RDW: 12.2 % (ref 11.5–15.5)
WBC: 11.5 10*3/uL — ABNORMAL HIGH (ref 4.0–10.5)
nRBC: 0 % (ref 0.0–0.2)

## 2020-03-27 LAB — HIV ANTIBODY (ROUTINE TESTING W REFLEX): HIV Screen 4th Generation wRfx: NONREACTIVE

## 2020-03-27 LAB — COMPREHENSIVE METABOLIC PANEL
ALT: 235 U/L — ABNORMAL HIGH (ref 0–44)
AST: 151 U/L — ABNORMAL HIGH (ref 15–41)
Albumin: 3.5 g/dL (ref 3.5–5.0)
Alkaline Phosphatase: 43 U/L (ref 38–126)
Anion gap: 11 (ref 5–15)
BUN: 10 mg/dL (ref 6–20)
CO2: 26 mmol/L (ref 22–32)
Calcium: 8.6 mg/dL — ABNORMAL LOW (ref 8.9–10.3)
Chloride: 88 mmol/L — ABNORMAL LOW (ref 98–111)
Creatinine, Ser: 0.46 mg/dL (ref 0.44–1.00)
GFR calc Af Amer: >60 mL/min (ref >60)
GFR calc non Af Amer: >60 mL/min (ref >60)
Glucose, Bld: 93 mg/dL (ref 70–99)
Potassium: 4 mmol/L (ref 3.5–5.1)
Sodium: 125 mmol/L — ABNORMAL LOW (ref 135–145)
Total Bilirubin: 0.8 mg/dL (ref 0.3–1.2)
Total Protein: 7.3 g/dL (ref 6.5–8.1)

## 2020-03-27 LAB — LACTIC ACID, PLASMA: Lactic Acid, Venous: 1.3 mmol/L (ref 0.5–1.9)

## 2020-03-27 LAB — TROPONIN I (HIGH SENSITIVITY): Troponin I (High Sensitivity): 51 ng/L — ABNORMAL HIGH (ref <18)

## 2020-03-27 LAB — HEMOGLOBIN A1C
Hgb A1c MFr Bld: 5.3 % (ref 4.8–5.6)
Mean Plasma Glucose: 105.41 mg/dL

## 2020-03-27 LAB — CBG MONITORING, ED
Glucose-Capillary: 116 mg/dL — ABNORMAL HIGH (ref 70–99)
Glucose-Capillary: 91 mg/dL (ref 70–99)
Glucose-Capillary: 81 mg/dL (ref 70–99)

## 2020-03-27 LAB — URINE CULTURE: Culture: NO GROWTH

## 2020-03-27 LAB — MRSA PCR SCREENING: MRSA by PCR: NEGATIVE

## 2020-03-27 LAB — ABO/RH: ABO/RH(D): B POS

## 2020-03-27 LAB — MAGNESIUM: Magnesium: 1.8 mg/dL (ref 1.7–2.4)

## 2020-03-27 MED ORDER — ONDANSETRON HCL 4 MG/2ML IJ SOLN: 4.0000 mg | Freq: Four times a day (QID) | INTRAMUSCULAR | Status: DC | PRN

## 2020-03-27 MED ORDER — ONDANSETRON HCL 4 MG PO TABS: 4.0000 mg | ORAL_TABLET | Freq: Four times a day (QID) | ORAL | Status: DC | PRN

## 2020-03-27 MED ORDER — LACTATED RINGERS IV BOLUS
500.0000 mL | Freq: Once | INTRAVENOUS | Status: AC
  Administered 2020-03-27: 500 mL via INTRAVENOUS

## 2020-03-27 MED ORDER — CHLORHEXIDINE GLUCONATE CLOTH 2 % EX PADS
6.0000 | MEDICATED_PAD | Freq: Every day | CUTANEOUS | Status: DC
  Administered 2020-03-28 – 2020-04-04 (×9): 6 via TOPICAL

## 2020-03-27 MED ORDER — STROKE: EARLY STAGES OF RECOVERY BOOK
Freq: Once | Status: DC
  Filled 2020-03-27: qty 1

## 2020-03-27 MED ORDER — DEXTROSE-NACL 5-0.9 % IV SOLN: INTRAVENOUS | Status: DC

## 2020-03-27 MED ORDER — ACETAMINOPHEN 325 MG PO TABS: 650.0000 mg | ORAL_TABLET | Freq: Four times a day (QID) | ORAL | Status: DC | PRN

## 2020-03-27 MED ORDER — METOPROLOL TARTRATE 5 MG/5ML IV SOLN
5.0000 mg | INTRAVENOUS | Status: AC | PRN
  Administered 2020-03-27 – 2020-03-28 (×2): 5 mg via INTRAVENOUS
  Filled 2020-03-27 (×2): qty 5

## 2020-03-27 MED ORDER — ACETAMINOPHEN 650 MG RE SUPP: 650.0000 mg | Freq: Four times a day (QID) | RECTAL | Status: DC | PRN

## 2020-03-27 NOTE — ED Notes (Signed)
Shalhoub, MD made aware of not being able to get connection device to utilized tube per MD and materials.

## 2020-03-27 NOTE — Progress Notes (Signed)
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  There is some mention of prominence of the left eye with redness.  Patient was then admitted to the hospital for lethargy metabolic encephalopathy.  Assessment/Plan:  Active Problems:   S/P VP shunt   Crouzon syndrome   SIRS (systemic inflammatory response syndrome) (HCC)   Altered mental status   Hypertensive urgency   Status post insertion of percutaneous endoscopic gastrostomy (PEG) tube (HCC)   Hyperammonemia (HCC)   Lactic acidosis  Metabolic encephalopathy, lethargy.  History of seizures.  Rule out infection.  Continue Lamictal and Keppra.  Seizure precautions.  Check EEG.  Neurology recommends MRI of the brain.  IV vancomycin, cefepime   Crouzon syndrome status post VP shunt.  Status post PEG tube placement.  Elevated BP.  Not on any antihypertensives at home.  Hyperammonemia.  On lactulose at home.  We will continue with that.  Hold Depakote for now.  Lactic acidosis.  On presentation.  Could be secondary to volume depletion.  Continue D5 normal saline for now due to lethargy.  Nutrition.  PEG tube.  Could resume nutrition if alertness improves.  VTE Prophylaxis: Lovenox subcu  Code Status: Full code  Family Communication: Spoke with the patient's mother at bedside.  Status is: Observation  The patient will require care spanning > 2 midnights and should be moved to inpatient because: Altered mental status,  Unsafe d/c plan, IV treatments appropriate due to intensity of illness or inability to take PO and Further neurological work-up  Dispo: The patient is from: Home              Anticipated d/c is to: Home              Anticipated d/c date is: 2 days              Patient currently is not medically stable to d/c.   Consultants:  Neurology  Procedures:  None  Antibiotics:   Cefepime., Vancomycin  Anti-infectives (From admission, onward)   Start     Dose/Rate Route Frequency Ordered Stop   03/26/20 2200  ceFEPIme (MAXIPIME) 2 g in sodium chloride 0.9 % 100 mL IVPB     2 g 200 mL/hr over 30 Minutes Intravenous Every 8 hours 03/26/20 2055     03/26/20 2200  vancomycin (VANCOCIN) IVPB 1000 mg/200 mL premix     1,000 mg 200 mL/hr over 60 Minutes Intravenous Every 24 hours 03/26/20 2101       Subjective: Today, patient was seen and examined at bedside.  Patient is nonverbal, lethargic- patient's mother at bedside.  Patient's mother states that this has been moving her extremities more than yesterday.  Objective: Vitals:   03/27/20 1230 03/27/20 1308  BP: (!) 167/116 (!) 162/122  Pulse: (!) 138 (!) 134  Resp: (!) 23 (!) 22  Temp:    SpO2: 99% 100%    Intake/Output Summary (Last 24 hours) at 03/27/2020 1345 Last data filed at 03/27/2020 0915 Gross per  24 hour  Intake 2399 ml  Output 1000 ml  Net 1399 ml   There were no vitals filed for this visit. There is no height or weight on file to calculate BMI.   Physical Exam:  GENERAL: Patient is nonverbal, lethargic, not in obvious distress. HENT: No scleral pallor or icterus. Pupils equally reactive to light. Oral mucosa is dry bilateral conjunctival injection with proptosis and fat pad protruding on the left eye.  Poor dentition. NECK: is supple, no gross swelling noted. CHEST: Clear to auscultation. No crackles or wheezes.  Diminished breath sounds bilaterally. CVS: S1 and S2 heard, no murmur.  Tachycardia, regular rate and  rhythm.  ABDOMEN: Soft, non-tender, bowel sounds are present.  PEG tube in place. EXTREMITIES: No edema.  Low muscle tone. CNS: Lethargic, moving extremities SKIN: warm and dry without rashes.  Data Review: I have personally reviewed the following laboratory data and studies,  CBC: Recent Labs  Lab 03/26/20 1608 03/26/20 1633 03/27/20 0543  WBC 9.1  --  11.5*  NEUTROABS 6.3  --  7.1  HGB 15.2* 17.0* 16.1*  HCT 46.9* 50.0* 48.7*  MCV 87.5  --  86.0  PLT 225  --  256   Basic Metabolic Panel: Recent Labs  Lab 03/26/20 1608 03/26/20 1633 03/27/20 0543  NA 128* 129* 125*  K 4.2 4.3 4.0  CL 92* 90* 88*  CO2 28  --  26  GLUCOSE 164* 161* 93  BUN 15 14 10   CREATININE 0.47 0.50 0.46  CALCIUM 8.7*  --  8.6*  MG  --   --  1.8   Liver Function Tests: Recent Labs  Lab 03/26/20 1608 03/27/20 0543  AST 90* 151*  ALT 145* 235*  ALKPHOS 44 43  BILITOT 0.4 0.8  PROT 7.5 7.3  ALBUMIN 3.7 3.5   No results for input(s): LIPASE, AMYLASE in the last 168 hours. Recent Labs  Lab 03/26/20 1609  AMMONIA 107*   Cardiac Enzymes: No results for input(s): CKTOTAL, CKMB, CKMBINDEX, TROPONINI in the last 168 hours. BNP (last 3 results) No results for input(s): BNP in the last 8760 hours.  ProBNP (last 3 results) No results for input(s): PROBNP in the last 8760 hours.  CBG: Recent Labs  Lab 03/26/20 1614 03/27/20 0117 03/27/20 0804 03/27/20 1208  GLUCAP 147* 116* 91 81   Recent Results (from the past 240 hour(s))  Culture, blood (routine x 2)     Status: None (Preliminary result)   Collection Time: 03/26/20  4:27 PM   Specimen: BLOOD  Result Value Ref Range Status   Specimen Description   Final    BLOOD RIGHT HAND Performed at [facility], [address], [place], [place]    Special Requests   Final    BOTTLES DRAWN AEROBIC AND ANAEROBIC Blood Culture adequate volume Performed at [facility], [address]  [place], [place]    Culture   Final    NO GROWTH < 12 HOURS Performed at [facility], [address] [place], [place]    Report Status PENDING  Incomplete  Culture, blood (routine x 2)     Status: None (Preliminary result)   Collection Time: 03/26/20  4:27 PM   Specimen: BLOOD  Result Value Ref Range Status   Specimen Description   Final    BLOOD LEFT HAND Performed at [facility], [address] [place], [place]    Special Requests  Final    BOTTLES DRAWN AEROBIC AND ANAEROBIC Blood Culture results may not be optimal due to an inadequate volume of blood received in culture bottles Performed at [facility], [place] [address], [place], [place] [address]    Culture   Final    NO GROWTH < 24 HOURS Performed at [place] [address], [place], [place] [address]    Report Status PENDING  Incomplete  Respiratory Panel by RT PCR (Flu A&B, Covid) - Nasopharyngeal Swab     Status: None   Collection Time: 03/26/20  6:04 PM   Specimen: Nasopharyngeal Swab  Result Value Ref Range Status   SARS Coronavirus 2 by RT PCR NEGATIVE NEGATIVE Final    Comment: (NOTE) SARS-CoV-2 target nucleic acids are NOT DETECTED. The SARS-CoV-2 RNA is generally detectable in upper respiratoy specimens during the acute phase of infection. The lowest concentration of SARS-CoV-2 viral copies this assay can detect is 131 copies/mL. A negative result does not preclude SARS-Cov-2 infection and should not be used as the sole basis for treatment or other patient management decisions. A negative result may occur with  improper specimen collection/handling, submission of specimen other than nasopharyngeal swab, presence of viral mutation(s) within the areas targeted by this assay, and inadequate number of viral copies (<131 copies/mL). A negative result must be combined with clinical observations, patient history, and epidemiological  information. The expected result is Negative. Fact Sheet for Patients:  PinkCheek.be Fact Sheet for Healthcare Providers:  GravelBags.it This test is not yet ap proved or cleared by the [place] FDA and  has been authorized for detection and/or diagnosis of SARS-CoV-2 by FDA under an Emergency Use Authorization (EUA). This EUA will remain  in effect (meaning this test can be used) for the duration of the COVID-19 declaration under Section 564(b)(1) of the Act, 21 U.S.C. section 360bbb-3(b)(1), unless the authorization is terminated or revoked sooner.    Influenza A by PCR NEGATIVE NEGATIVE Final   Influenza B by PCR NEGATIVE NEGATIVE Final    Comment: (NOTE) The Xpert Xpress SARS-CoV-2/FLU/RSV assay is intended as an aid in  the diagnosis of influenza from Nasopharyngeal swab specimens and  should not be used as a sole basis for treatment. Nasal washings and  aspirates are unacceptable for Xpert Xpress SARS-CoV-2/FLU/RSV  testing. Fact Sheet for Patients: PinkCheek.be Fact Sheet for Healthcare Providers: GravelBags.it This test is not yet approved or cleared by the [place] FDA and  has been authorized for detection and/or diagnosis of SARS-CoV-2 by  FDA under an Emergency Use Authorization (EUA). This EUA will remain  in effect (meaning this test can be used) for the duration of the  Covid-19 declaration under Section 564(b)(1) of the Act, 21  U.S.C. section 360bbb-3(b)(1), unless the authorization is  terminated or revoked. Performed at [facility], [place] [address], [place], [place] [address]      Studies: CT Head Wo Contrast  Result Date: 03/26/2020 CLINICAL DATA:  Encephalopathy EXAM: CT HEAD WITHOUT CONTRAST TECHNIQUE: Contiguous axial images were obtained from the base of the skull through the vertex without intravenous contrast.  COMPARISON:  07/05/2006 FINDINGS: Brain: No evidence of acute infarction, hemorrhage, hydrocephalus, extra-axial collection or mass lesion/mass effect. Right parietal approach intraventricular shunt catheter. No significant change in configuration of the lateral ventricles in comparison to prior CT dated 07/05/2006. Vascular: No hyperdense vessel or unexpected calcification. Skull: Evidence of prior craniotomies. Negative for fracture or focal lesion. Sinuses/Orbits: No acute finding.  Exophthalmos. Other: None. IMPRESSION: 1.  No acute intracranial pathology. 2. Right parietal approach intraventricular shunt catheter. No significant change in configuration of the lateral ventricles in comparison to prior CT dated 07/05/2006. Electronically Signed   By: Lauralyn Primes M.D.   On: 03/26/2020 17:55   CT HEAD W CONTRAST  Result Date: 03/26/2020 CLINICAL DATA:  Encephalopathy EXAM: CT HEAD WITH CONTRAST TECHNIQUE: Contiguous axial images were obtained from the base of the skull through the vertex with intravenous contrast. CONTRAST:  36mL OMNIPAQUE IOHEXOL 300 MG/ML  SOLN COMPARISON:  Head CT 03/26/2020, 07/05/2006 FINDINGS: Brain: Unchanged position right parietal approach shunt catheter tip near the superior midline. Size and configuration of the ventricles is unchanged. There is no abnormal contrast enhancement. No acute hemorrhage. No midline shift or other mass effect. Vascular: No abnormal hyperdensity of the major intracranial arteries or dural venous sinuses. No intracranial atherosclerosis. Skull: The visualized skull base, calvarium and extracranial soft tissues are normal. Sinuses/Orbits: Exophthalmos. Clear paranasal sinuses. The maxillary sinuses are non pneumatized. The orbits are normal. IMPRESSION: 1. Unchanged position of right parietal approach shunt catheter with unchanged size and configuration of the ventricles. 2. No acute intracranial abnormality. Electronically Signed   By: Deatra Robinson M.D.    On: 03/26/2020 21:55   DG Chest Portable 1 View  Result Date: 03/26/2020 CLINICAL DATA:  Lethargy since yesterday, LEFT eye swelling, decreased ambulation, history of seizures, altered mental status EXAM: PORTABLE CHEST 1 VIEW COMPARISON:  Portable exam 1632 hours compared to 03/30/2007 FINDINGS: Normal heart size, mediastinal contours, and pulmonary vascularity. Rotated to the RIGHT. Shunt tubing seen in the RIGHT cervical and upper thoracic region. When accounting for rotation, no definite pulmonary infiltrate, pleural effusion or pneumothorax identified. Thoracic scoliosis and thoracic cage deformity noted. IMPRESSION: No acute abnormalities. Electronically Signed   By: Ulyses Southward M.D.   On: 03/26/2020 16:43   CT Orbits W Contrast  Result Date: 03/26/2020 CLINICAL DATA:  Orbital cellulitis EXAM: CT ORBITS WITH CONTRAST TECHNIQUE: Multidetector CT images was performed according to the standard protocol following intravenous contrast administration. CONTRAST:  97mL OMNIPAQUE IOHEXOL 300 MG/ML  SOLN COMPARISON:  None. FINDINGS: Orbits: Bilateral exophthalmos. No traumatic or inflammatory finding. Globes, optic nerves, orbital fat, extraocular muscles, vascular structures, and lacrimal glands are normal. Visualized sinuses: Clear. Soft tissues: Negative. Limited intracranial: No significant or unexpected finding. IMPRESSION: 1. No inflammatory findings of the orbits. No evidence of orbital cellulitis. 2. Bilateral exophthalmos. Electronically Signed   By: Lauralyn Primes M.D.   On: 03/26/2020 18:08     Joycelyn Das, MD  [facility] 03/27/2020

## 2020-03-27 NOTE — ED Notes (Signed)
Spoke with [facility] about tubing for patient. They will call me back.

## 2020-03-27 NOTE — ED Notes (Signed)
Pt. Documented in error see above note in chart. 

## 2020-03-27 NOTE — ED Notes (Signed)
Pt. CBG 116, RN, Sue Lush made aware.

## 2020-03-27 NOTE — ED Notes (Signed)
Have contacted materials and AC to try to find the correct tubing to medicate patient.

## 2020-03-27 NOTE — ED Notes (Signed)
Mother went home until morring. Ask mother to bring a tube connection when she returns while we continue to try to lactate one.

## 2020-03-28 ENCOUNTER — Other Ambulatory Visit: Payer: Self-pay

## 2020-03-28 ENCOUNTER — Inpatient Hospital Stay (HOSPITAL_COMMUNITY)
Admit: 2020-03-28 | Discharge: 2020-03-28 | Disposition: A | Discharge status: Home / Self Care | Payer: Medicaid Other | Attending: Neurology | Admitting: Neurology

## 2020-03-28 DIAGNOSIS — R4182 Altered mental status, unspecified: Secondary | ICD-10-CM

## 2020-03-28 LAB — COMPREHENSIVE METABOLIC PANEL
ALT: 418 U/L — ABNORMAL HIGH (ref 0–44)
AST: 234 U/L — ABNORMAL HIGH (ref 15–41)
Albumin: 2.9 g/dL — ABNORMAL LOW (ref 3.5–5.0)
Alkaline Phosphatase: 38 U/L (ref 38–126)
Anion gap: 8 (ref 5–15)
BUN: 10 mg/dL (ref 6–20)
CO2: 25 mmol/L (ref 22–32)
Calcium: 7.9 mg/dL — ABNORMAL LOW (ref 8.9–10.3)
Chloride: 96 mmol/L — ABNORMAL LOW (ref 98–111)
Creatinine, Ser: 0.46 mg/dL (ref 0.44–1.00)
GFR calc Af Amer: >60 mL/min (ref >60)
GFR calc non Af Amer: >60 mL/min (ref >60)
Glucose, Bld: 132 mg/dL — ABNORMAL HIGH (ref 70–99)
Potassium: 4.1 mmol/L (ref 3.5–5.1)
Sodium: 129 mmol/L — ABNORMAL LOW (ref 135–145)
Total Bilirubin: 1.3 mg/dL — ABNORMAL HIGH (ref 0.3–1.2)
Total Protein: 6.3 g/dL — ABNORMAL LOW (ref 6.5–8.1)

## 2020-03-28 LAB — CBC
HCT: 48.3 % — ABNORMAL HIGH (ref 36.0–46.0)
Hemoglobin: 16.2 g/dL — ABNORMAL HIGH (ref 12.0–15.0)
MCH: 28.8 pg (ref 26.0–34.0)
MCHC: 33.5 g/dL (ref 30.0–36.0)
MCV: 85.9 fL (ref 80.0–100.0)
Platelets: 165 10*3/uL (ref 150–400)
RBC: 5.62 MIL/uL — ABNORMAL HIGH (ref 3.87–5.11)
RDW: 12.3 % (ref 11.5–15.5)
WBC: 8.6 10*3/uL (ref 4.0–10.5)
nRBC: 0 % (ref 0.0–0.2)

## 2020-03-28 LAB — PHOSPHORUS: Phosphorus: 2.7 mg/dL (ref 2.5–4.6)

## 2020-03-28 LAB — AMMONIA: Ammonia: 70 umol/L — ABNORMAL HIGH (ref 9–35)

## 2020-03-28 LAB — MAGNESIUM: Magnesium: 2 mg/dL (ref 1.7–2.4)

## 2020-03-28 MED ORDER — LABETALOL HCL 5 MG/ML IV SOLN
10.0000 mg | INTRAVENOUS | Status: AC | PRN
  Administered 2020-03-28 (×2): 10 mg via INTRAVENOUS
  Filled 2020-03-28 (×2): qty 4

## 2020-03-28 MED ORDER — LABETALOL HCL 5 MG/ML IV SOLN
10.0000 mg | INTRAVENOUS | Status: DC | PRN
  Administered 2020-03-29: 10 mg via INTRAVENOUS
  Filled 2020-03-28 (×2): qty 4

## 2020-03-28 MED ORDER — OSMOLITE 1.2 CAL PO LIQD: 1000.0000 mL | ORAL | Status: DC

## 2020-03-28 MED ORDER — OSMOLITE 1.2 CAL PO LIQD
356.0000 mL | Freq: Four times a day (QID) | ORAL | Status: DC
  Administered 2020-03-28 – 2020-04-04 (×28): 356 mL
  Filled 2020-03-28 (×25): qty 474

## 2020-03-28 MED ORDER — BUMETANIDE 0.25 MG/ML IJ SOLN
0.5000 mg | Freq: Once | INTRAMUSCULAR | Status: DC
  Filled 2020-03-28: qty 10

## 2020-03-28 MED ORDER — BUMETANIDE 0.25 MG/ML IJ SOLN
0.5000 mg | Freq: Once | INTRAMUSCULAR | Status: AC
  Administered 2020-03-28: 0.5 mg via INTRAVENOUS
  Filled 2020-03-28: qty 10

## 2020-03-28 MED ORDER — METOPROLOL TARTRATE 5 MG/5ML IV SOLN
5.0000 mg | INTRAVENOUS | Status: AC | PRN
  Administered 2020-03-28 – 2020-03-31 (×2): 5 mg via INTRAVENOUS
  Filled 2020-03-28 (×2): qty 5

## 2020-03-28 MED ORDER — AMLODIPINE 1 MG/ML ORAL SUSPENSION
5.0000 mg | Freq: Every day | ORAL | Status: DC
  Administered 2020-03-28 – 2020-04-01 (×5): 5 mg
  Filled 2020-03-28 (×11): qty 5

## 2020-03-28 MED ORDER — LIP MEDEX EX OINT
TOPICAL_OINTMENT | CUTANEOUS | Status: DC | PRN
  Filled 2020-03-28: qty 7

## 2020-03-28 MED ORDER — LABETALOL HCL 100 MG PO TABS
100.0000 mg | ORAL_TABLET | Freq: Two times a day (BID) | ORAL | Status: DC
  Administered 2020-03-28 – 2020-04-04 (×14): 100 mg via ORAL
  Filled 2020-03-28 (×15): qty 1

## 2020-03-28 MED ORDER — AMLODIPINE BESYLATE 5 MG PO TABS: 5.0000 mg | ORAL_TABLET | Freq: Every day | ORAL | Status: DC

## 2020-03-28 MED ORDER — ORAL CARE MOUTH RINSE
15.0000 mL | Freq: Two times a day (BID) | OROMUCOSAL | Status: DC
  Administered 2020-03-28 – 2020-04-04 (×15): 15 mL via OROMUCOSAL

## 2020-03-28 MED ORDER — FREE WATER
120.0000 mL | Freq: Four times a day (QID) | Status: DC
  Administered 2020-03-28 – 2020-04-04 (×29): 120 mL

## 2020-03-28 NOTE — Progress Notes (Addendum)
Lamotrigine level has been ordered.   EEG has been ordered.   Repeat ammonia level has been ordered to determine if holding Depakote has had an effect.   MRI brain is pending.   Electronically signed: Dr. Caryl Pina

## 2020-03-28 NOTE — Progress Notes (Addendum)
Initial Nutrition Assessment  DOCUMENTATION CODES:   Not applicable  INTERVENTION:  D/c continuous Osmolite 1.2 @ 25 ml/hr, advance as tolerated 10 ml every 6 hrs to goal rate 55 ml/hr (1320 ml/day)  Bolus 356 ml (1.5 cartons) Osmolite 1.2 QID with 60 ml water flush before and after feedings. -This regimen will provide 1710 kcal, 79 grams of protein, and 1170 ml free water from formula (1650 total free water with flushes)  NUTRITION DIAGNOSIS:   Inadequate oral intake related to inability to eat as evidenced by NPO status.   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  Patient admitted on 5/3 for lethargy metabolic encephalopathy.  Patient lethargic this morning at, no family at bedside. Per chart review, home tube feeding regimen not listed on home medications. Per notes, patient bolus feeds at home, will use continuous feedings for inpatient feedings.   Addendum: Received page from RN regarding continuuos feeds. RN spoke with patient's mother who reports that patient was on continuous feedings at one time, but switched to gravity feeds due to patient unable to maintain continuous 30 degree head elevation. RN reports patient home regimen of Osmolite 1.2 x 6 cans/day. RD will discontinue continuous feedings and switch to bolus feedings.   Per notes: -EEG results pending -MRI brain pending -blood cultures negative  so far -trending up LTFs, off Depakote at this time -bolus feedings at home  Current wt 104.06 lbs No recent weight history for review, noted 93-95 lbs from 06/2018-11/2018.  Medications reviewed and include: Lactulose IVF: D5 NaCl @ 100 ml/hr IVPB: Maxipime, Keppra, Vancomycin Labs: Na 129 (L)  NUTRITION - FOCUSED PHYSICAL EXAM: Unable to complete at this time  Diet Order:   Diet Order            Diet NPO time specified Except for: Other (See Comments)  Diet effective now              EDUCATION NEEDS:   No education needs have been identified at this time  Skin:  Skin Assessment: Reviewed RN Assessment  Last BM:  5/4 type 4  Height:   Ht Readings from Last 1 Encounters:  11/30/18 4\' 11"  (1.499 m)    Weight:   Wt Readings from Last 1 Encounters:  03/28/20 47.3 kg    BMI:  Body mass index is 21.06 kg/m.  Estimated Nutritional Needs:   Kcal:  1550-1700  Protein:  70-85  Fluid:  >/= 1.5 L/day   05/28/20, RD, LDN Clinical Nutrition After Hours/Weekend Pager # in Amion

## 2020-03-28 NOTE — Progress Notes (Signed)
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 Patient is nonverbal and was profoundly more lethargic than her baseline.  There is some mention of prominence of the left eye with redness.  Patient was then admitted to the hospital for lethargy, metabolic encephalopathy.  Assessment/Plan:  Active Problems:   S/P VP shunt   Crouzon syndrome   SIRS (systemic inflammatory response syndrome) (HCC)   Altered mental status   Hypertensive urgency   Status post insertion of percutaneous endoscopic gastrostomy (PEG) tube (HCC)   Hyperammonemia (HCC)   Lactic acidosis   Metabolic encephalopathy  Metabolic encephalopathy, lethargy.  History of seizures.  Continue Lamictal and Keppra. Off depakote. Seizure precautions.  Check EEG, pending.  Neurology recommends MRI of the brain has been difficult to obtain due to clarity on the shunt material.. On  IV vancomycin, cefepime will continue for now.  Covid was negative.  Blood cultures negative so far.  Left eye conjunctivitis.  Continue antibiotic drops.  History of seizures.  Off Depakote at this time.  On Lamictal and Keppra.  Neurology on board.  Elevated LFTs likely hyperammonemia likely secondary to Depakote.  Closely monitor.  Trending up LFTs.  Off Depakote at this time.   Crouzon syndrome status post VP shunt.  Status post PEG tube placement.  Elevated BP.  Not on any antihypertensives at home.  Hyperammonemia.  On lactulose at home.  We will continue with that.  Hold Depakote for now.  Lactic acidosis.   On presentation.  Could be secondary to volume depletion.  Continue D5 normal saline for now due to lethargy.  Nutrition.  PEG tube.  Consult dietitian to resume tube feeding.  Patient uses bolus feeding at home.  VTE Prophylaxis: Lovenox subcu  Code Status: Full code  Family Communication: None today  Status is: Inpatient  The patient will require  inpatient because: Altered mental status, Unsafe d/c plan, IV treatments appropriate due to intensity of illness or inability to take PO and Further neurological work-up  Dispo: The patient is from: Home              Anticipated d/c is to: Home              Anticipated d/c date is: 2 days              Patient currently is not medically stable to d/c.   Consultants:  Neurology  Procedures:  None  Antibiotics:  . Cefepime, Vancomycin  Anti-infectives (From admission, onward)   Start     Dose/Rate Route Frequency Ordered Stop   03/26/20 2200  ceFEPIme (MAXIPIME) 2 g in sodium chloride 0.9 % 100 mL IVPB     2 g 200 mL/hr over 30 Minutes Intravenous Every 8 hours 03/26/20 2055     03/26/20 2200  vancomycin (VANCOCIN) IVPB 1000 mg/200 mL premix     1,000 mg 200 mL/hr over 60 Minutes Intravenous Every 24 hours 03/26/20 2101       Subjective: Today, patient was seen and examined at bedside.  None verbal and lethargic.  Moving extremities.  Blood pressure has been elevated  Objective: Vitals:   03/28/20 0830 03/28/20 1147  BP: (!) 143/107   Pulse: 99   Resp: 15   Temp:  98.2 F (36.8 C)  SpO2: 100%     Intake/Output Summary (Last 24 hours) at 03/28/2020 1229 Last data filed at 03/28/2020 1010 Gross per 24 hour  Intake 2627.68 ml  Output 500 ml  Net 2127.68 ml   Filed Weights   03/28/20 0500  Weight: 47.3 kg   Body mass index is 21.06 kg/m.   Physical Exam: GENERAL: Patient is nonverbal, lethargic, not in obvious distress. HENT: No scleral pallor or icterus. Pupils equally reactive to light. Oral mucosa is dry,  left conjunctival injection with proptosis and fat pad protruding on the left eye.  Poor dentition. NECK: is supple, no gross swelling noted. CHEST: Clear to auscultation. No crackles or wheezes.  Diminished breath sounds bilaterally. CVS: S1 and S2 heard, no murmur.  regular rate and rhythm.  ABDOMEN: Soft, non-tender, bowel sounds are present.  PEG tube in place. EXTREMITIES: No edema.  Low muscle tone. CNS: Lethargic, moving extremities SKIN: warm and dry without rashes.  Data Review: I have personally reviewed the following laboratory data and studies,  CBC: Recent Labs  Lab 03/26/20 1608 03/26/20 1633 03/27/20 0543 03/28/20 0210  WBC 9.1  --  11.5* 8.6  NEUTROABS 6.3  --  7.1  --   HGB 15.2* 17.0* 16.1* 16.2*  HCT 46.9* 50.0* 48.7* 48.3*  MCV 87.5  --  86.0 85.9  PLT 225  --  256 585   Basic Metabolic Panel: Recent Labs  Lab 03/26/20 1608 03/26/20 1633 03/27/20 0543 03/28/20 0238  NA 128* 129* 125* 129*  K 4.2 4.3 4.0 4.1  CL 92* 90* 88* 96*  CO2 28  --  26 25  GLUCOSE 164* 161* 93 132*  BUN 15 14 10 10   CREATININE 0.47 0.50 0.46 0.46  CALCIUM 8.7*  --  8.6* 7.9*  MG  --   --  1.8 2.0  PHOS  --   --   --  2.7   Liver Function Tests: Recent Labs  Lab 03/26/20 1608 03/27/20 0543 03/28/20 0238  AST 90* 151* 234*  ALT 145* 235* 418*  ALKPHOS 44 43 38  BILITOT 0.4 0.8 1.3*  PROT 7.5 7.3 6.3*  ALBUMIN 3.7 3.5 2.9*   No results for input(s): LIPASE, AMYLASE in the last 168 hours. Recent Labs  Lab 03/26/20 1609 03/28/20 0904  AMMONIA 107* 70*   Cardiac Enzymes: No results for input(s): CKTOTAL, CKMB, CKMBINDEX, TROPONINI in the last 168 hours. BNP (last 3 results) No results for input(s): BNP in the last 8760 hours.  ProBNP (last 3 results) No results for input(s): PROBNP in the last 8760 hours.  CBG: Recent Labs  Lab 03/26/20 1614 03/27/20 0117 03/27/20 0804 03/27/20 1208  GLUCAP 147* 116* 91 81   Recent Results (from the past 240 hour(s))   Urine culture     Status: None   Collection Time: 03/26/20  4:09 PM   Specimen: Urine, Clean Catch  Result Value Ref Range Status   Specimen Description   Final    URINE, CLEAN CATCH Performed at [facility], [place] [address] [place], [place] [address]    Special Requests   Final    NONE Performed at [facility], [place] [address], [place],  [address]    Culture   Final  NO GROWTH Performed at [facility], [address], [place], [place] [address]    Report Status 03/27/2020 FINAL  Final  Culture, blood (routine x 2)     Status: None (Preliminary result)   Collection Time: 03/26/20  4:27 PM   Specimen: BLOOD  Result Value Ref Range Status   Specimen Description   Final    BLOOD RIGHT HAND Performed at [facility], [address] [place], [place] [address]    Special Requests   Final    BOTTLES DRAWN AEROBIC AND ANAEROBIC Blood Culture adequate volume Performed at [facility], [address] [place], [place] [address]    Culture   Final    NO GROWTH 2 DAYS Performed at [facility], [address] [place], [place] [address]    Report Status PENDING  Incomplete  Culture, blood (routine x 2)     Status: None (Preliminary result)   Collection Time: 03/26/20  4:27 PM   Specimen: BLOOD  Result Value Ref Range Status   Specimen Description   Final    BLOOD LEFT HAND Performed at [facility], [address] [place], [place] [address]    Special Requests   Final    BOTTLES DRAWN AEROBIC AND ANAEROBIC Blood Culture results may not be optimal due to an inadequate volume of blood received in culture bottles Performed at [facility], [address], [place], [place] [address]    Culture   Final    NO GROWTH 2 DAYS Performed at [facility], [address], [place], [place] [address]    Report Status PENDING  Incomplete    Respiratory Panel by RT PCR (Flu A&B, Covid) - Nasopharyngeal Swab     Status: None   Collection Time: 03/26/20  6:04 PM   Specimen: Nasopharyngeal Swab  Result Value Ref Range Status   SARS Coronavirus 2 by RT PCR NEGATIVE NEGATIVE Final    Comment: (NOTE) SARS-CoV-2 target nucleic acids are NOT DETECTED. The SARS-CoV-2 RNA is generally detectable in upper respiratoy specimens during the acute phase of infection. The lowest concentration of SARS-CoV-2 viral copies this assay can detect is 131 copies/mL. A negative result does not preclude SARS-Cov-2 infection and should not be used as the sole basis for treatment or other patient management decisions. A negative result may occur with  improper specimen collection/handling, submission of specimen other than nasopharyngeal swab, presence of viral mutation(s) within the areas targeted by this assay, and inadequate number of viral copies (<131 copies/mL). A negative result must be combined with clinical observations, patient history, and epidemiological information. The expected result is Negative. Fact Sheet for Patients:  https://www.moore.com/ Fact Sheet for Healthcare Providers:  https://www.young.biz/ This test is not yet ap proved or cleared by the [place] FDA and  has been authorized for detection and/or diagnosis of SARS-CoV-2 by FDA under an Emergency Use Authorization (EUA). This EUA will remain  in effect (meaning this test can be used) for the duration of the COVID-19 declaration under Section 564(b)(1) of the Act, 21 U.S.C. section 360bbb-3(b)(1), unless the authorization is terminated or revoked sooner.    Influenza A by PCR NEGATIVE NEGATIVE Final   Influenza B by PCR NEGATIVE NEGATIVE Final    Comment: (NOTE) The Xpert Xpress SARS-CoV-2/FLU/RSV assay is intended as an aid in  the diagnosis of influenza from Nasopharyngeal swab specimens and  should not be used as a sole  basis  for treatment. Nasal washings and  aspirates are unacceptable for Xpert Xpress SARS-CoV-2/FLU/RSV  testing. Fact Sheet for Patients: https://www.moore.com/ Fact Sheet for Healthcare Providers: https://www.young.biz/ This test is not yet approved or cleared by the [place] FDA and  has been authorized for detection and/or diagnosis of SARS-CoV-2 by  FDA under an Emergency Use Authorization (EUA). This EUA will remain  in effect (meaning this test can be used) for the duration of the  Covid-19 declaration under Section 564(b)(1) of the Act, 21  U.S.C. section 360bbb-3(b)(1), unless the authorization is  terminated or revoked. Performed at [facility], [address], [place], [place] [address]   MRSA PCR Screening     Status: None   Collection Time: 03/27/20 10:02 PM   Specimen: Nasopharyngeal  Result Value Ref Range Status   MRSA by PCR NEGATIVE NEGATIVE Final    Comment:        The GeneXpert MRSA Assay (FDA approved for NASAL specimens only), is one component of a comprehensive MRSA colonization surveillance program. It is not intended to diagnose MRSA infection nor to guide or monitor treatment for MRSA infections. Performed at [facility], [address], [place], [place] [address]      Studies: CT Head Wo Contrast  Result Date: 03/26/2020 CLINICAL DATA:  Encephalopathy EXAM: CT HEAD WITHOUT CONTRAST TECHNIQUE: Contiguous axial images were obtained from the base of the skull through the vertex without intravenous contrast. COMPARISON:  07/05/2006 FINDINGS: Brain: No evidence of acute infarction, hemorrhage, hydrocephalus, extra-axial collection or mass lesion/mass effect. Right parietal approach intraventricular shunt catheter. No significant change in configuration of the lateral ventricles in comparison to prior CT dated 07/05/2006. Vascular: No hyperdense vessel or unexpected  calcification. Skull: Evidence of prior craniotomies. Negative for fracture or focal lesion. Sinuses/Orbits: No acute finding.  Exophthalmos. Other: None. IMPRESSION: 1.  No acute intracranial pathology. 2. Right parietal approach intraventricular shunt catheter. No significant change in configuration of the lateral ventricles in comparison to prior CT dated 07/05/2006. Electronically Signed   By: Lauralyn Primes M.D.   On: 03/26/2020 17:55   CT HEAD W CONTRAST  Result Date: 03/26/2020 CLINICAL DATA:  Encephalopathy EXAM: CT HEAD WITH CONTRAST TECHNIQUE: Contiguous axial images were obtained from the base of the skull through the vertex with intravenous contrast. CONTRAST:  10mL OMNIPAQUE IOHEXOL 300 MG/ML  SOLN COMPARISON:  Head CT 03/26/2020, 07/05/2006 FINDINGS: Brain: Unchanged position right parietal approach shunt catheter tip near the superior midline. Size and configuration of the ventricles is unchanged. There is no abnormal contrast enhancement. No acute hemorrhage. No midline shift or other mass effect. Vascular: No abnormal hyperdensity of the major intracranial arteries or dural venous sinuses. No intracranial atherosclerosis. Skull: The visualized skull base, calvarium and extracranial soft tissues are normal. Sinuses/Orbits: Exophthalmos. Clear paranasal sinuses. The maxillary sinuses are non pneumatized. The orbits are normal. IMPRESSION: 1. Unchanged position of right parietal approach shunt catheter with unchanged size and configuration of the ventricles. 2. No acute intracranial abnormality. Electronically Signed   By: Deatra Robinson M.D.   On: 03/26/2020 21:55   DG Chest Portable 1 View  Result Date: 03/26/2020 CLINICAL DATA:  Lethargy since yesterday, LEFT eye swelling, decreased ambulation, history of seizures, altered mental status EXAM: PORTABLE CHEST 1 VIEW COMPARISON:  Portable exam 1632 hours compared to 03/30/2007 FINDINGS: Normal heart size, mediastinal contours, and pulmonary  vascularity. Rotated to the RIGHT. Shunt tubing seen in the RIGHT cervical and upper thoracic region. When accounting  for rotation, no definite pulmonary infiltrate, pleural effusion or pneumothorax identified. Thoracic scoliosis and thoracic cage deformity noted. IMPRESSION: No acute abnormalities. Electronically Signed   By: Ulyses Southward M.D.   On: 03/26/2020 16:43   CT Orbits W Contrast  Result Date: 03/26/2020 CLINICAL DATA:  Orbital cellulitis EXAM: CT ORBITS WITH CONTRAST TECHNIQUE: Multidetector CT images was performed according to the standard protocol following intravenous contrast administration. CONTRAST:  85mL OMNIPAQUE IOHEXOL 300 MG/ML  SOLN COMPARISON:  None. FINDINGS: Orbits: Bilateral exophthalmos. No traumatic or inflammatory finding. Globes, optic nerves, orbital fat, extraocular muscles, vascular structures, and lacrimal glands are normal. Visualized sinuses: Clear. Soft tissues: Negative. Limited intracranial: No significant or unexpected finding. IMPRESSION: 1. No inflammatory findings of the orbits. No evidence of orbital cellulitis. 2. Bilateral exophthalmos. Electronically Signed   By: Lauralyn Primes M.D.   On: 03/26/2020 18:08     Joycelyn Das, MD  [facility] 03/28/2020

## 2020-03-28 NOTE — Progress Notes (Signed)
EEG complete - results pending 

## 2020-03-28 NOTE — Procedures (Signed)
Patient Name: Alyssa Wong  MRN: [id number]  Epilepsy Attending: Charlsie Quest  Referring Physician/Provider: Dr. Caryl Pina Date: 03/28/2020 Duration: 26.03 minutes  Patient history: 29 year old female with history of cerebral palsy and seizures who presented with increased lethargy in the setting of hyperammonemia on Depakote.  EEG to evaluate for seizures.  Level of alertness: Awake  AEDs during EEG study: Keppra, lamotrigine  Technical aspects: This EEG study was done with scalp electrodes positioned according to the 10-20 International system of electrode placement. Electrical activity was acquired at a sampling rate of 500Hz  and reviewed with a high frequency filter of 70Hz  and a low frequency filter of 1Hz . EEG data were recorded continuously and digitally stored.   Description: No clear posterior dominant rhythm was seen.  EEG showed continuous generalized sharply contoured 3 to 5 Hz theta-delta slowing in right hemisphere, maximal right temporoparietal region.  Sharp waves, at times periodic at 1 Hz were also noted in the right temporoparietal region.  There was also 6 to 9 Hz theta-alpha activity in left hemisphere.  Hyperventilation and photic stimulation were not performed.  Abnormality -Sharp waves, right temporoparietal region -Continuous slow, generalized and lateralized right hemisphere, maximal right temporoparietal region  IMPRESSION: This study showed evidence of epileptogenicity as well as cortical dysfunction in the right temporoparietal region consistent with underlying craniotomy.  Additionally, there is evidence of mild to moderate diffuse encephalopathy, nonspecific etiology but likely related to patient's history of cerebral palsy and intellectual delay. No seizures were seen throughout the recording.    [name] 

## 2020-03-29 LAB — COMPREHENSIVE METABOLIC PANEL
ALT: 334 U/L — ABNORMAL HIGH (ref 0–44)
AST: 124 U/L — ABNORMAL HIGH (ref 15–41)
Albumin: 3 g/dL — ABNORMAL LOW (ref 3.5–5.0)
Alkaline Phosphatase: 41 U/L (ref 38–126)
Anion gap: 8 (ref 5–15)
BUN: 11 mg/dL (ref 6–20)
CO2: 26 mmol/L (ref 22–32)
Calcium: 7.9 mg/dL — ABNORMAL LOW (ref 8.9–10.3)
Chloride: 103 mmol/L (ref 98–111)
Creatinine, Ser: 0.43 mg/dL — ABNORMAL LOW (ref 0.44–1.00)
GFR calc Af Amer: >60 mL/min (ref >60)
GFR calc non Af Amer: >60 mL/min (ref >60)
Glucose, Bld: 155 mg/dL — ABNORMAL HIGH (ref 70–99)
Potassium: 3.5 mmol/L (ref 3.5–5.1)
Sodium: 137 mmol/L (ref 135–145)
Total Bilirubin: 0.5 mg/dL (ref 0.3–1.2)
Total Protein: 6.2 g/dL — ABNORMAL LOW (ref 6.5–8.1)

## 2020-03-29 LAB — CBC
HCT: 42.7 % (ref 36.0–46.0)
Hemoglobin: 13.6 g/dL (ref 12.0–15.0)
MCH: 28.3 pg (ref 26.0–34.0)
MCHC: 31.9 g/dL (ref 30.0–36.0)
MCV: 88.8 fL (ref 80.0–100.0)
Platelets: 187 10*3/uL (ref 150–400)
RBC: 4.81 MIL/uL (ref 3.87–5.11)
RDW: 12.4 % (ref 11.5–15.5)
WBC: 7.1 10*3/uL (ref 4.0–10.5)
nRBC: 0 % (ref 0.0–0.2)

## 2020-03-29 LAB — MAGNESIUM: Magnesium: 2.3 mg/dL (ref 1.7–2.4)

## 2020-03-29 LAB — AMMONIA: Ammonia: 43 umol/L — ABNORMAL HIGH (ref 9–35)

## 2020-03-29 LAB — PHOSPHORUS: Phosphorus: 2.3 mg/dL — ABNORMAL LOW (ref 2.5–4.6)

## 2020-03-29 MED ORDER — LOSARTAN POTASSIUM 50 MG PO TABS
50.0000 mg | ORAL_TABLET | Freq: Every day | ORAL | Status: DC
  Administered 2020-03-29 – 2020-04-02 (×5): 50 mg
  Filled 2020-03-29 (×5): qty 1

## 2020-03-29 MED ORDER — K PHOS MONO-SOD PHOS DI & MONO 155-852-130 MG PO TABS
500.0000 mg | ORAL_TABLET | Freq: Two times a day (BID) | ORAL | Status: DC
  Administered 2020-03-29 – 2020-04-01 (×7): 500 mg via ORAL
  Filled 2020-03-29 (×7): qty 2

## 2020-03-29 NOTE — Progress Notes (Addendum)
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 Patient is nonverbal and was profoundly more lethargic than her baseline.  There was some mention of prominence of the left eye with redness.  Patient was then admitted to the hospital for lethargy, metabolic encephalopathy.  Assessment/Plan:  Active Problems:   S/P VP shunt   Crouzon syndrome   SIRS (systemic inflammatory response syndrome) (HCC)   Altered mental status   Hypertensive urgency   Status post insertion of percutaneous endoscopic gastrostomy (PEG) tube (HCC)   Hyperammonemia (HCC)   Lactic acidosis   Metabolic encephalopathy  Metabolic encephalopathy.  Could be secondary to hyperammonemia from Depakote.  History of seizures.  Continue Lamictal and Keppra. Off depakote. Seizure precautions.  EEG with encephalopathy.  Neurology recommends MRI of the brain but has been difficult to obtain due to clarity on the shunt material. I again spoke with the MRI technician today and likely it will be difficult to discern this since the shunt procedure was done in 1990.  Patient did have an MRI done subsequently in 1991 but the technician here states that MRI technology has improved including strength and it is difficult to know whether the latest MRI would be compatible.  On  IV vancomycin, cefepime will continue for now.  Covid was negative.  Blood cultures negative so far.  De-escalate antibiotic by tomorrow if okay with neurology.  Frequent diarrheal episodes.  Will titrate lactulose for bowel movements  2-3 times a day.  Ammonia level has however improved at this time.  Left eye conjunctivitis.  Continue antibiotic drops.  History of seizures.  Off Depakote at this time.  On Lamictal and Keppra.  Neurology on board. Lamotrigine level has been sent.  Elevated LFTs with hyperammonemia likely secondary to Depakote.   Closely monitor.  LFts slightly improved compared to yesterday. Off Depakote at this time.  Continue lactulose   Crouzon syndrome status post VP shunt.  Status post PEG tube placement.  Elevated BP.  Not on any antihypertensives at home.  Lactic acidosis.  Resolved..   Nutrition.  PEG tube with bolus feeds.  Nutritionist on board  Hypophosphatemia.  Will replenish via PEG tube.  VTE Prophylaxis: Lovenox subcu  Code Status: Full code  Family Communication: Unable to reach the patient's mother Ms. Misty Stanley on the phone today.  Status is: Inpatient  The patient will require  inpatient because: Altered mental status, Unsafe d/c plan, IV treatments appropriate due to intensity of illness or inability to take PO and Further neurological work-up.  Dispo: The patient is from: Home              Anticipated d/c is to: Home              Anticipated d/c date is:2-3 days              Patient currently is not medically stable to d/c.  Consultants:  Neurology  Procedures:  None  Antibiotics:  . Cefepime, Vancomycin iv  Anti-infectives (From admission, onward)   Start     Dose/Rate Route Frequency  Ordered Stop   03/26/20 2200  ceFEPIme (MAXIPIME) 2 g in sodium chloride 0.9 % 100 mL IVPB     2 g 200 mL/hr over 30 Minutes Intravenous Every 8 hours 03/26/20 2055     03/26/20 2200  vancomycin (VANCOCIN) IVPB 1000 mg/200 mL premix     1,000 mg 200 mL/hr over 60 Minutes Intravenous Every 24 hours 03/26/20 2101       Subjective: Today, patient was seen and examined at bedside.  Nonverbal and lethargic.  Has been having frequent bowel movements.  Objective: Vitals:    03/29/20 1000 03/29/20 1100  BP: (!) 150/95 134/84  Pulse: (!) 119 (!) 121  Resp: 17 (!) 25  Temp:    SpO2: 97% 99%    Intake/Output Summary (Last 24 hours) at 03/29/2020 1117 Last data filed at 03/29/2020 1029 Gross per 24 hour  Intake 2430.35 ml  Output 800 ml  Net 1630.35 ml   Filed Weights   03/28/20 0500  Weight: 47.3 kg   Body mass index is 19.07 kg/m.   Physical Exam: GENERAL: Patient is nonverbal, lethargic, not in obvious distress. HENT: No scleral pallor or icterus. Pupils equally reactive to light. Oral mucosa is dry, left conjunctival injection with proptosis and fat pad protruding on the left eye.  Poor dentition. NECK: is supple, no gross swelling noted. CHEST: Clear to auscultation. No crackles or wheezes.  Diminished breath sounds bilaterally. CVS: S1 and S2 heard, no murmur.  regular rate and rhythm.  ABDOMEN: Soft, non-tender, bowel sounds are present.  PEG tube in place. EXTREMITIES: No edema.  Low muscle tone. CNS: Lethargic, moving extremities SKIN: warm and dry without rashes.  Data Review: I have personally reviewed the following laboratory data and studies,  CBC: Recent Labs  Lab 03/26/20 1608 03/26/20 1633 03/27/20 0543 03/28/20 0210 03/29/20 0221  WBC 9.1  --  11.5* 8.6 7.1  NEUTROABS 6.3  --  7.1  --   --   HGB 15.2* 17.0* 16.1* 16.2* 13.6  HCT 46.9* 50.0* 48.7* 48.3* 42.7  MCV 87.5  --  86.0 85.9 88.8  PLT 225  --  256 165 187   Basic Metabolic Panel: Recent Labs  Lab 03/26/20 1608 03/26/20 1633 03/27/20 0543 03/28/20 0238 03/29/20 0221  NA 128* 129* 125* 129* 137  K 4.2 4.3 4.0 4.1 3.5  CL 92* 90* 88* 96* 103  CO2 28  --  26 25 26   GLUCOSE 164* 161* 93 132* 155*  BUN 15 14 10 10 11   CREATININE 0.47 0.50 0.46 0.46 0.43*  CALCIUM 8.7*  --  8.6* 7.9* 7.9*  MG  --   --  1.8 2.0 2.3  PHOS  --   --   --  2.7 2.3*   Liver Function Tests: Recent Labs  Lab 03/26/20 1608 03/27/20 0543 03/28/20 0238 03/29/20 0221  AST 90* 151*  234* 124*  ALT 145* 235* 418* 334*  ALKPHOS 44 43 38 41  BILITOT 0.4 0.8 1.3* 0.5  PROT 7.5 7.3 6.3* 6.2*  ALBUMIN 3.7 3.5 2.9* 3.0*   No results for input(s): LIPASE, AMYLASE in the last 168 hours. Recent Labs  Lab 03/26/20 1609 03/28/20 0904 03/29/20 0950  AMMONIA 107* 70* 43*   Cardiac Enzymes: No results for input(s): CKTOTAL, CKMB, CKMBINDEX, TROPONINI in the last 168 hours. BNP (last 3 results) No results for input(s): BNP in the last 8760 hours.  ProBNP (last 3 results) No results for input(s): PROBNP in the last 8760 hours.  CBG: Recent Labs  Lab 03/26/20 1614 03/27/20 0117 03/27/20 0804 03/27/20 1208  GLUCAP 147* 116* 91 81   Recent Results (from the past 240 hour(s))  Urine culture     Status: None   Collection Time: 03/26/20  4:09 PM   Specimen: Urine, Clean Catch  Result Value Ref Range Status   Specimen Description   Final    URINE, CLEAN CATCH Performed at [facility], [address] [place], [place] [address]    Special Requests   Final    NONE Performed at [facility], [address] [place], [place] [address]    Culture   Final    NO GROWTH Performed at [facility], [address] [place], [place] [address]    Report Status 03/27/2020 FINAL  Final  Culture, blood (routine x 2)     Status: None (Preliminary result)   Collection Time: 03/26/20  4:27 PM   Specimen: BLOOD  Result Value Ref Range Status   Specimen Description   Final    BLOOD RIGHT HAND Performed at [facility], [address] [place], [place] [address]    Special Requests   Final    BOTTLES DRAWN AEROBIC AND ANAEROBIC Blood Culture adequate volume Performed at [facility], [address] [place], [place] [address]    Culture   Final    NO GROWTH 3 DAYS Performed at [facility], [address] [place], [place] [address]    Report Status PENDING  Incomplete  Culture, blood  (routine x 2)     Status: None (Preliminary result)   Collection Time: 03/26/20  4:27 PM   Specimen: BLOOD  Result Value Ref Range Status   Specimen Description   Final    BLOOD LEFT HAND Performed at [facility], [address] [place], [place] [address]    Special Requests   Final    BOTTLES DRAWN AEROBIC AND ANAEROBIC Blood Culture results may not be optimal due to an inadequate volume of blood received in culture bottles Performed at [facility], [address] [place], [place] [address]    Culture   Final    NO GROWTH 3 DAYS Performed at [facility], [address] [place], [place] [address]    Report Status PENDING  Incomplete  Respiratory Panel by RT PCR (Flu A&B, Covid) - Nasopharyngeal Swab     Status: None   Collection Time: 03/26/20  6:04 PM   Specimen: Nasopharyngeal Swab  Result Value Ref Range Status   SARS Coronavirus 2 by RT PCR NEGATIVE NEGATIVE Final    Comment: (NOTE) SARS-CoV-2 target nucleic acids are NOT DETECTED. The SARS-CoV-2 RNA is generally detectable in upper respiratoy specimens during the acute phase of infection. The lowest concentration of SARS-CoV-2 viral copies this assay can detect is 131 copies/mL. A negative result does not preclude SARS-Cov-2 infection and should not be used as the sole basis for treatment or other patient management decisions. A negative result may occur with  improper specimen collection/handling, submission of specimen other than nasopharyngeal swab, presence of viral mutation(s) within the areas targeted by this assay, and inadequate number of viral copies (<131 copies/mL). A negative result must be combined with clinical observations, patient history, and epidemiological information. The expected result is Negative. Fact Sheet for Patients:  https://www.moore.com/https://www.fda.gov/media/142436/download Fact Sheet for Healthcare Providers:  https://www.young.biz/https://www.fda.gov/media/142435/download This  test is not yet ap proved or  cleared by the [place] and  has been authorized for detection and/or diagnosis of SARS-CoV-2 by FDA under an Emergency Use Authorization (EUA). This EUA will remain  in effect (meaning this test can be used) for the duration of the COVID-19 declaration under Section 564(b)(1) of the Act, 21 U.S.C. section 360bbb-3(b)(1), unless the authorization is terminated or revoked sooner.    Influenza A by PCR NEGATIVE NEGATIVE Final   Influenza B by PCR NEGATIVE NEGATIVE Final    Comment: (NOTE) The Xpert Xpress SARS-CoV-2/FLU/RSV assay is intended as an aid in  the diagnosis of influenza from Nasopharyngeal swab specimens and  should not be used as a sole basis for treatment. Nasal washings and  aspirates are unacceptable for Xpert Xpress SARS-CoV-2/FLU/RSV  testing. Fact Sheet for Patients: https://www.moore.com/ Fact Sheet for Healthcare Providers: https://www.young.biz/ This test is not yet approved or cleared by the [place] FDA and  has been authorized for detection and/or diagnosis of SARS-CoV-2 by  FDA under an Emergency Use Authorization (EUA). This EUA will remain  in effect (meaning this test can be used) for the duration of the  Covid-19 declaration under Section 564(b)(1) of the Act, 21  U.S.C. section 360bbb-3(b)(1), unless the authorization is  terminated or revoked. Performed at [facility], [address] [place], [place] [address]   MRSA PCR Screening     Status: None   Collection Time: 03/27/20 10:02 PM   Specimen: Nasopharyngeal  Result Value Ref Range Status   MRSA by PCR NEGATIVE NEGATIVE Final    Comment:        The GeneXpert MRSA Assay (FDA approved for NASAL specimens only), is one component of a comprehensive MRSA colonization surveillance program. It is not intended to diagnose MRSA infection nor to guide or monitor treatment for MRSA  infections. Performed at [facility], [address] [place], [place] [address]      Studies: EEG  Result Date: 03/28/2020 Charlsie Quest, MD     03/28/2020  1:31 PM Patient Name: Alyssa Wong MRN: [id number] Epilepsy Attending: Charlsie Quest Referring Physician/Provider: Dr. Caryl Pina Date: 03/28/2020 Duration: 26.03 minutes Patient history: 29 year old female with history of cerebral palsy and seizures who presented with increased lethargy in the setting of hyperammonemia on Depakote.  EEG to evaluate for seizures. Level of alertness: Awake AEDs during EEG study: Keppra, lamotrigine Technical aspects: This EEG study was done with scalp electrodes positioned according to the 10-20 International system of electrode placement. Electrical activity was acquired at a sampling rate of 500Hz  and reviewed with a high frequency filter of 70Hz  and a low frequency filter of 1Hz . EEG data were recorded continuously and digitally stored. Description: No clear posterior dominant rhythm was seen.  EEG showed continuous generalized sharply contoured 3 to 5 Hz theta-delta slowing in right hemisphere, maximal right temporoparietal region.  Sharp waves, at times periodic at 1 Hz were also noted in the right temporoparietal region.  There was also 6 to 9 Hz theta-alpha activity in left hemisphere.  Hyperventilation and photic stimulation were not performed. Abnormality -Sharp waves, right temporoparietal region -Continuous slow, generalized and lateralized right hemisphere, maximal right temporoparietal region IMPRESSION: This study showed evidence of epileptogenicity as well as cortical dysfunction in the right temporoparietal region consistent with underlying craniotomy.  Additionally, there is evidence of mild to moderate diffuse encephalopathy, nonspecific etiology but likely related to patient's history of cerebral palsy and intellectual delay. No seizures were seen throughout the  recording.  Priyanka Hubert Azure, MD  [facility] 03/29/2020

## 2020-03-29 NOTE — Progress Notes (Addendum)
EEG reveals sharp waves in the right temporoparietal region as well as continuous slowing both generalized and lateralized to the right hemisphere, maximal in the right temporoparietal region. Overall impression is that the EEG study shows evidence of epileptogenicity as well as cortical dysfunction in the right temporoparietal region consistent with known underlying craniotomy.  Additionally, there is evidence of mild to moderate diffuse encephalopathy, nonspecific to etiology but likely related to the patient's history of cerebral palsy and intellectual delay. No seizures were seen throughout the recording.  Lamotrigine level is still pending. MRI brain is also pending.    Repeat ammonia level yesterday was 70, down from the initial level of > 100. Most likely the hyperammonemia was due to side effect of Depakote, which has been stopped. Repeat ammonia level for today has been ordered to assess for possible continued normalization.    A/R: 29 year old female with history of mental retardation, cerebral palsy, crouzon syndrome, and seizures who presents with increasing lethargy in setting of hyperammonemia on Depakote.  It is not uncommon for Depakote to cause a hyperammonemia; with an ammonia above 100 and encephalopathy, hyperammonemic encephalopathy due to Depakote is felt to be the most likely etiology for the patient's AMS.  -- No seizures seen on EEG, although there is evidence for epileptogenicity arising from the right temporoparietal region.  -- MRI brain is recommended. May need to contact Neurosurgery to assess for compatibility of her VP shunt with MRI.  -- Continue lamotrigine and Keppra at current doses.  -- Lamotrigine level is pending. May need to increase lamotrigine dose as Depakote washes out of her system, which would be expected to increase hepatic metabolism of lamotrigine (Depakote inhibits the metabolism of lamotrigine by hepatic enzymes).  -- Repeat ammonia level for today has  been ordered to assess for possible continued normalization.     Electronically signed: Dr. Caryl Pina

## 2020-03-29 NOTE — TOC Progression Note (Signed)
Transition of Care [facility]) - Progression Note    Patient Details  Name: Alyssa Wong MRN: [id number] Date of Birth: 1991/08/01  [facility]) CM/SW Contact  Golda Acre, RN Phone Number: 03/29/2020, 1:11 PM  Clinical Narrative:    Discharge Planning Return to top of Shunt, Ventriculoperitoneal for Hydrocephalus - ISC [Expand All / Collapse All]  Discharge planning includes[C]: ? Assessment of needs and planning for care, including(35):  Develop treatment plan (involving multiple providers as needed).  Evaluate and address preadmission functioning as needed.  Evaluate and address social determinants of health (eg, housing, food).  Evaluate and address patient or caregiver preferences as indicated.  Identify skilled services needed at next level of care, with specific attention to:  Home safety assessment  Medication management, adherence instruction, and side effects assessment(36)  Neurologic status assessment(37)  Ongoing education required(38)  Wound or dressing management(39)  Evaluate and address psychosocial status issues as indicated. See Psychosocial AssessmentSR for further information. ? Early identification of anticipated discharge destination; options include(40)(41):  Home, considerations include:  Access to follow-up care  Home safety assessment. See Home Safety AssessmentSR for further information.  Self-management ability if appropriate. See Activities of Daily Living (ADL) and Instrumental Activities of Daily Living (IADL) AssessmentSR for further information.  Caregiver need, ability, and availability  Post-acute skilled care or custodial care as indicated. See Discharge Planning ToolSR for further information. ? Transition of care plan complete(41)  Patient, family, and caregiver education complete. See Shunt, Ventriculoperitoneal for Hydrocephalus: Patient Education for CliniciansSR for further information.  See Teach Back  ToolSR for further information.   Medication reconciliation complete   Plan communicated to patient, family, caregiver, and all members of care team, including(44)(45):  Inpatient care and service providers  Primary care provider  All post-discharge care and service providers  Appointments planned or scheduled, which may include:  Primary care provider  Neurologist(32)  Neurosurgeon(32)  Other  Outpatient testing and procedure plans made, which may include:  Radiology(46)  Other  Referrals made for assistance or support, which may include:  Financial, for follow-up care, medication, and transportation  End-stage disease planning  Smoking cessation counseling or treatment  Social services (eg, social programs, advance directives)  Other  Medical equipment and supplies coordinated (ie, delivered or delivery confirmed), which may include:  Wound care supplies(47)  Other  Patient remains on q4 neuro checks, iv keppra                                                            Readmission Risk Interventions No flowsheet data found.

## 2020-03-29 NOTE — Progress Notes (Signed)
Per MRI request, an operative report of patients VP shunt placement was needed in order for an MRI to be done. After multiple attempts to find this information I called Pediatric Neurosurgery [facility] of [facility] [phone]. They were unable to locate the exact information needed and believe that it may be in a paper chart seeing the operation was done in the year 1990. The Pediatric Neurosurgery office has now requested those paper records and will scan them into Epic once they receive them. They also will call the patients mother, Misty Stanley, with the needed information in case this issue should ever arise again.

## 2020-03-29 NOTE — Progress Notes (Signed)
Pharmacy Antibiotic Note  Alyssa Wong is a 29 y.o. female admitted on 03/26/2020 with altered mental status/lethargy.  Pharmacy has been consulted for vancomycin dosing. 03/29/2020 D#3 empiric vancomycin & cefepime WBC WNL, SCr WNL, AF, no positive culture data  Plan: ? De-escalate or DC abx Continues on cefepime 2 gm IV q8h Continues on vancomycin 1 gm IV q24  Height: 5\' 2"  (157.5 cm) Weight: 47.3 kg (104 lb 4.4 oz) IBW/kg (Calculated) : 50.1  Temp (24hrs), Avg:98.3 F (36.8 C), Min:97.5 F (36.4 C), Max:99.3 F (37.4 C)  Recent Labs  Lab 03/26/20 1608 03/26/20 1625 03/26/20 1633 03/26/20 1825 03/26/20 2333 03/27/20 0543 03/28/20 0210 03/28/20 0238 03/29/20 0221  WBC 9.1  --   --   --   --  11.5* 8.6  --  7.1  CREATININE 0.47  --  0.50  --   --  0.46  --  0.46 0.43*  LATICACIDVEN  --  1.6  --  2.4* 1.3  --   --   --   --     Estimated Creatinine Clearance: 76.8 mL/min (A) (by C-G formula based on SCr of 0.43 mg/dL (L)).    No Known Allergies Antimicrobials this admission: 5/2 vanc >>  5/2 cefepime >> Dose adjustments this admission:  Microbiology results:  5/2 BCx: NGTD 5/2 UCx: NGF 5/2: COVID/influenza PCR: negative 5/3 MRSA neg 5/2 HIV NR  Thank you for allowing pharmacy to be a part of this patient's care . 7/2, Pharm.D 03/29/2020 1:44 PM

## 2020-03-29 NOTE — Plan of Care (Addendum)
Pt responded well to bumex and labatelol.  Incont several times of urine and stool.  Rectal pouch placed.

## 2020-03-30 LAB — CBC
HCT: 39.2 % (ref 36.0–46.0)
Hemoglobin: 12.5 g/dL (ref 12.0–15.0)
MCH: 28.5 pg (ref 26.0–34.0)
MCHC: 31.9 g/dL (ref 30.0–36.0)
MCV: 89.5 fL (ref 80.0–100.0)
Platelets: 179 10*3/uL (ref 150–400)
RBC: 4.38 MIL/uL (ref 3.87–5.11)
RDW: 12.6 % (ref 11.5–15.5)
WBC: 9 10*3/uL (ref 4.0–10.5)
nRBC: 0 % (ref 0.0–0.2)

## 2020-03-30 LAB — BASIC METABOLIC PANEL
Anion gap: 9 (ref 5–15)
BUN: 9 mg/dL (ref 6–20)
CO2: 24 mmol/L (ref 22–32)
Calcium: 8 mg/dL — ABNORMAL LOW (ref 8.9–10.3)
Chloride: 102 mmol/L (ref 98–111)
Creatinine, Ser: 0.47 mg/dL (ref 0.44–1.00)
GFR calc Af Amer: >60 mL/min (ref >60)
GFR calc non Af Amer: >60 mL/min (ref >60)
Glucose, Bld: 117 mg/dL — ABNORMAL HIGH (ref 70–99)
Potassium: 3.8 mmol/L (ref 3.5–5.1)
Sodium: 135 mmol/L (ref 135–145)

## 2020-03-30 LAB — PHOSPHORUS: Phosphorus: 3 mg/dL (ref 2.5–4.6)

## 2020-03-30 LAB — LAMOTRIGINE LEVEL
Lamotrigine Lvl: 3.3 ug/mL (ref 2.0–20.0)
Lamotrigine Lvl: 3.6 ug/mL (ref 2.0–20.0)

## 2020-03-30 LAB — MAGNESIUM: Magnesium: 2 mg/dL (ref 1.7–2.4)

## 2020-03-30 MED ORDER — LEVETIRACETAM 100 MG/ML PO SOLN
500.0000 mg | Freq: Two times a day (BID) | ORAL | Status: DC
  Administered 2020-03-30: 500 mg
  Filled 2020-03-30 (×2): qty 5

## 2020-03-30 MED ORDER — LEVETIRACETAM 100 MG/ML PO SOLN
1000.0000 mg | Freq: Two times a day (BID) | ORAL | Status: DC
  Administered 2020-03-30: 1000 mg
  Filled 2020-03-30: qty 10

## 2020-03-30 MED ORDER — SODIUM CHLORIDE 0.9 % IV SOLN
2.0000 g | Freq: Two times a day (BID) | INTRAVENOUS | Status: AC
  Administered 2020-03-30 – 2020-04-04 (×11): 2 g via INTRAVENOUS
  Filled 2020-03-30 (×2): qty 20
  Filled 2020-03-30: qty 2
  Filled 2020-03-30: qty 20
  Filled 2020-03-30 (×3): qty 2
  Filled 2020-03-30: qty 20
  Filled 2020-03-30 (×3): qty 2

## 2020-03-30 NOTE — Progress Notes (Addendum)
NEUROLOGY PROGRESS NOTE   Subjective: Patient resting comfortably  Exam: Vitals:   03/30/20 0600 03/30/20 0800  BP: 110/71 (!) 123/91  Pulse: 99 100  Resp: (!) 24 (!) 29  Temp:  98.5 F (36.9 C)  SpO2: 99% 98%    Neuro:  Mental Status: Patient resting comfortably.  Opens eyes to stimulus.  Does not follow commands. Cranial Nerves: II: Blinks to threat. III,IV, VI: Shows right exotropia, disconjugate gaze. V,VII: Face symmetrical Motor: Spastic quadriparesis, withdraws minimally to noxious stimuli bilaterally in the arms, does not move to noxious stimuli in the legs. Deep Tendon Reflexes: 2+ and symmetric throughout     Medications:  Scheduled: . amLODIPine  5 mg Per Tube Daily  . Chlorhexidine Gluconate Cloth  6 each Topical Daily  . feeding supplement (OSMOLITE 1.2 CAL)  356 mL Per Tube QID  . free water  120 mL Per Tube QID  . labetalol  100 mg Oral BID  . lactulose  30 g Per Tube TID  . lamoTRIgine  25 mg Oral Daily  . lamoTRIgine  50 mg Oral QHS  . levETIRAcetam  500 mg Per Tube BID  . losartan  50 mg Per Tube Daily  . mouth rinse  15 mL Mouth Rinse BID  . phosphorus  500 mg Oral BID  . trimethoprim-polymyxin b  2 drop Left Eye Q6H    Pertinent Labs/Diagnostics: -Lamictal level pending -Ammonia level pending  EEG  Result Date: 03/28/2020 Charlsie Quest, MD     03/28/2020  1:31 PM Patient Name: Alyssa Wong MRN: [id number] Epilepsy Attending: Charlsie Quest Referring Physician/Provider: Dr. Caryl Pina Date: 03/28/2020 Duration: 26.03 minutes Patient history: 29 year old female with history of cerebral palsy and seizures who presented with increased lethargy in the setting of hyperammonemia on Depakote.  EEG to evaluate for seizures. Level of alertness: Awake AEDs during EEG study: Keppra, lamotrigine Technical aspects: This EEG study was done with scalp electrodes positioned according to the 10-20 International system of electrode placement. Electrical  activity was acquired at a sampling rate of 500Hz  and reviewed with a high frequency filter of 70Hz  and a low frequency filter of 1Hz . EEG data were recorded continuously and digitally stored. Description: No clear posterior dominant rhythm was seen.  EEG showed continuous generalized sharply contoured 3 to 5 Hz theta-delta slowing in right hemisphere, maximal right temporoparietal region.  Sharp waves, at times periodic at 1 Hz were also noted in the right temporoparietal region.  There was also 6 to 9 Hz theta-alpha activity in left hemisphere.  Hyperventilation and photic stimulation were not performed. Abnormality -Sharp waves, right temporoparietal region -Continuous slow, generalized and lateralized right hemisphere, maximal right temporoparietal region IMPRESSION: This study showed evidence of epileptogenicity as well as cortical dysfunction in the right temporoparietal region consistent with underlying craniotomy.  Additionally, there is evidence of mild to moderate diffuse encephalopathy, nonspecific etiology but likely related to patient's history of cerebral palsy and intellectual delay. No seizures were seen throughout the recording. Priyanka O Yadav   EEG-- No seizures seen on EEG, although there is evidence for epileptogenicity arising from the right temporoparietal region.   PA-C [facility] [phone]  Assessment:  29 year old female withhistory of mental retardation, cerebral palsy, crouzon syndrome, and seizures who presents withincreasing lethargy in setting of hyperammonemia on Depakote. It is not uncommon for Depakote to cause a hyperammonemia; with an ammonia above 100 and encephalopathy, hyperammonemic encephalopathy due to Depakote is felt to be the most  likely etiology for the patient's AMS.   -Of note patient is unable to obtain MRI brain.  Recommendations: -- Continue lamotrigine and Keppra at current doses.  -- Lamotrigine level is pending. May  need to increase lamotrigine dose as Depakote washes out of her system, which would be expected to increase hepatic metabolism of lamotrigine --Depakote inhibits the metabolism of lamotrigine by hepatic enzymes.  -- Repeat ammonia level now down to 43 as of yesterday repeat ammonia has been ordered.  --As exam was difficulty it is hard to evaluate for possible meningitis, given patient has no WBC elevation and /or temperature, at this point would complete empiric antibiotic and do not believe obtain CSF would be helpful as they likely it would be partially treated at current time.    03/30/2020, 12:04 PM

## 2020-03-30 NOTE — Progress Notes (Addendum)
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 Patient is nonverbal and was profoundly more lethargic than her baseline.  There was some mention of prominence of the left eye with redness.  Patient was then admitted to the hospital for lethargy, metabolic encephalopathy.  Assessment/Plan:  Active Problems:   S/P VP shunt   Crouzon syndrome   SIRS (systemic inflammatory response syndrome) (HCC)   Altered mental status   Hypertensive urgency   Status post insertion of percutaneous endoscopic gastrostomy (PEG) tube (HCC)   Hyperammonemia (HCC)   Lactic acidosis   Metabolic encephalopathy  Metabolic encephalopathy.  Could be secondary to hyperammonemia from Depakote.  History of seizures.  Continue Lamictal and Keppra. Off depakote. Seizure precautions.  EEG with encephalopathy.  Neurology recommends MRI of the brain but has been difficult to obtain due to clarity on the shunt material and difficulty obtaining information of procedure was done in 1990.    On  IV vancomycin, cefepime will continue. COVID was negative.  Blood cultures negative so far.  T max of 98.7F. De-escalate antibiotic if okay with neurology.  Ammonia levels are trending down.  Check ammonia levels in a.m.  Frequent diarrheal episodes.  Had some bowel movements yesterday.  Ammonia level has however improved at this time. Check ammonia in am.  Left eye conjunctivitis.  Continue antibiotic drops. Improving.  History of seizures.  Off Depakote at this time.  On Lamictal and Keppra.   Neurology on board. Lamotrigine level has been sent.  Elevated LFTs with hyperammonemia likely secondary to Depakote.   Closely monitor.  LFTs slightly improved but will repeat in am. Off Depakote at this time.  Continue lactulose for BM 2-3 times a day.   Crouzon syndrome status post VP shunt.  Status post PEG tube placement.  Elevated BP. On presentation. Not on any antihypertensives at home. On labetalol.  Lactic acidosis.  Resolved..   Nutrition.  PEG tube with bolus feeds.  Nutritionist on board. Tolerating ok.  Hypophosphatemia.  Improved. Phos of 3.0 today. Dc K phos  VTE Prophylaxis: Lovenox subcu  Code Status: Full code  Addendum:  03/30/2020 11:18 AM  Spoke with Dr. Joni Fears neurology.  Recommended decreasing the Keppra dose to 500 twice daily.  Will change cefepime to ceftriaxone for possible CNS infection with the shunt.  Family Communication:  I spoke with the patient's mother on the phone and updated her about the clinical condition of the patient.  Status is: Inpatient  The patient will require  inpatient because: Altered mental status, Unsafe d/c plan, IV treatments appropriate due to intensity of illness or inability to take PO and further neurological work-up.  Dispo: The patient is from: Home              Anticipated d/c is to: Home              Anticipated d/c date is: 2-3 days              Patient currently is not medically stable to d/c.  Consultants:  Neurology  Procedures:  None  Antibiotics:  . Cefepime, Vancomycin iv  Anti-infectives (From admission, onward)   Start     Dose/Rate Route Frequency Ordered Stop   03/26/20 2200  ceFEPIme (MAXIPIME) 2 g in sodium chloride 0.9 % 100 mL IVPB     2 g 200 mL/hr over 30 Minutes Intravenous Every 8 hours 03/26/20 2055     03/26/20 2200  vancomycin (VANCOCIN) IVPB 1000 mg/200 mL premix     1,000 mg 200 mL/hr over 60 Minutes Intravenous Every 24 hours 03/26/20 2101       Subjective: Today, patient  was seen and examined at bedside.  Spoke with the patient's mother on the phone.  Patient is nonverbal.  Patient's mother states that she was more alert and moving all extremities yesterday but not quite at her baseline.  Unable to do MRI due to difficulty locating the information.  No fever nausea vomiting reported.  Has been having some bowel movements.  Objective: Vitals:   03/30/20 0600 03/30/20 0800  BP: 110/71 (!) 123/91  Pulse: 99 100  Resp: (!) 24 (!) 29  Temp:  98.5 F (36.9 C)  SpO2: 99% 98%    Intake/Output Summary (Last 24 hours) at 03/30/2020 1030 Last data filed at 03/30/2020 0700 Gross per 24 hour  Intake 1879.08 ml  Output 100 ml  Net 1779.08 ml   Filed Weights   03/28/20 0500 03/30/20 0500  Weight: 47.3 kg 49.4 kg   Body mass index is 19.92 kg/m.   Physical Exam: General: Nonverbal, not in obvious distress, HENT: Normocephalic, pupils equally reacting to light and accommodation.  Bilateral proptosis with mild conjunctival injection on the left side. oral mucosa is dry Chest:  Clear breath sounds.  Diminished breath sounds bilaterally. No crackles or wheezes.  CVS: S1 &S2 heard. No murmur.  Regular rate and rhythm. Abdomen: Soft, nontender, nondistended.  Bowel sounds are heard.  PEG tube in place extremities: No cyanosis, clubbing or edema.  Peripheral pulses are palpable. Psych: Nonverbal, appears somnolent CNS: Being a few extremities.  Somnolent Skin: Warm and dry.  No rashes noted.  Data Review: I have personally reviewed the following laboratory data and studies,  CBC: Recent Labs  Lab 03/26/20 1608 03/26/20 1608 03/26/20 1633 03/27/20 0543 03/28/20 0210 03/29/20 0221 03/30/20 0230  WBC 9.1  --   --  11.5* 8.6 7.1 9.0  NEUTROABS 6.3  --   --  7.1  --   --   --   HGB 15.2*   < > 17.0* 16.1* 16.2* 13.6 12.5  HCT 46.9*   < > 50.0* 48.7* 48.3* 42.7 39.2  MCV 87.5  --   --  86.0 85.9 88.8 89.5  PLT 225  --   --  256 165 187 179   < > = values in  this interval not displayed.   Basic Metabolic Panel: Recent Labs  Lab 03/26/20 1608 03/26/20 1608 03/26/20 1633 03/27/20 0543 03/28/20 0238 03/29/20 0221 03/30/20 0230  NA 128*   < > 129* 125* 129* 137 135  K 4.2   < > 4.3 4.0 4.1 3.5 3.8  CL 92*   < > 90* 88* 96* 103 102  CO2 28  --   --  26 25 26 24   GLUCOSE 164*   < > 161* 93 132* 155* 117*  BUN 15   < > 14 10 10 11 9   CREATININE 0.47   < > 0.50 0.46 0.46 0.43* 0.47  CALCIUM 8.7*  --   --  8.6* 7.9* 7.9* 8.0*  MG  --   --   --  1.8 2.0 2.3 2.0  PHOS  --   --   --   --  2.7 2.3* 3.0   < > = values in this interval not displayed.   Liver Function Tests: Recent Labs  Lab 03/26/20 1608 03/27/20 0543 03/28/20 0238 03/29/20 0221  AST 90* 151* 234* 124*  ALT 145* 235* 418* 334*  ALKPHOS 44 43 38 41  BILITOT 0.4 0.8 1.3* 0.5  PROT 7.5 7.3 6.3* 6.2*  ALBUMIN 3.7 3.5 2.9* 3.0*   No results for input(s): LIPASE, AMYLASE in the last 168 hours. Recent Labs  Lab 03/26/20 1609 03/28/20 0904 03/29/20 0950  AMMONIA 107* 70* 43*   Cardiac Enzymes: No results for input(s): CKTOTAL, CKMB, CKMBINDEX, TROPONINI in the last 168 hours. BNP (last 3 results) No results for input(s): BNP in the last 8760 hours.  ProBNP (last 3 results) No results for input(s): PROBNP in the last 8760 hours.  CBG: Recent Labs  Lab 03/26/20 1614 03/27/20 0117 03/27/20 0804 03/27/20 1208  GLUCAP 147* 116* 91 81   Recent Results (from the past 240 hour(s))  Urine culture     Status: None   Collection Time: 03/26/20  4:09 PM   Specimen: Urine, Clean Catch  Result Value Ref Range Status   Specimen Description   Final    URINE, CLEAN CATCH Performed at [facility], [address], [place], [place] [address]    Special Requests   Final    NONE Performed at [facility], [address] [place], [place] [address]    Culture   Final    NO GROWTH Performed at [facility], [address],  [place], [place] [address]    Report Status 03/27/2020 FINAL  Final  Culture, blood (routine x 2)     Status: None (Preliminary result)   Collection Time: 03/26/20  4:27 PM   Specimen: BLOOD  Result Value Ref Range Status   Specimen Description   Final    BLOOD RIGHT HAND Performed at [facility], [address], [place], [place] [address]    Special Requests   Final    BOTTLES DRAWN AEROBIC AND ANAEROBIC Blood Culture adequate volume Performed at [facility], [address] [place], [place] [address]    Culture   Final    NO GROWTH 4 DAYS Performed at [facility], [address], [place], [place] [address]    Report Status PENDING  Incomplete  Culture, blood (routine x 2)     Status: None (Preliminary result)   Collection Time: 03/26/20  4:27 PM   Specimen: BLOOD  Result Value Ref Range Status   Specimen Description   Final    BLOOD LEFT HAND Performed at [facility], [address] [place], [place] [address]    Special Requests   Final    BOTTLES DRAWN AEROBIC AND ANAEROBIC Blood Culture results may not be optimal due to an inadequate volume of blood received in culture bottles Performed at [facility], [address], [place], [place] [address]    Culture   Final    NO GROWTH 4 DAYS Performed at [facility], [address] [place], [place] [address]    Report Status PENDING  Incomplete  Respiratory Panel by RT PCR (Flu A&B, Covid) - Nasopharyngeal Swab  Status: None   Collection Time: 03/26/20  6:04 PM   Specimen: Nasopharyngeal Swab  Result Value Ref Range Status   SARS Coronavirus 2 by RT PCR NEGATIVE NEGATIVE Final    Comment: (NOTE) SARS-CoV-2 target nucleic acids are NOT DETECTED. The SARS-CoV-2 RNA is generally detectable in upper respiratoy specimens during the acute phase of infection. The lowest concentration of SARS-CoV-2 viral copies this assay can detect is 131  copies/mL. A negative result does not preclude SARS-Cov-2 infection and should not be used as the sole basis for treatment or other patient management decisions. A negative result may occur with  improper specimen collection/handling, submission of specimen other than nasopharyngeal swab, presence of viral mutation(s) within the areas targeted by this assay, and inadequate number of viral copies (<131 copies/mL). A negative result must be combined with clinical observations, patient history, and epidemiological information. The expected result is Negative. Fact Sheet for Patients:  https://www.moore.com/ Fact Sheet for Healthcare Providers:  https://www.young.biz/ This test is not yet ap proved or cleared by the [place] FDA and  has been authorized for detection and/or diagnosis of SARS-CoV-2 by FDA under an Emergency Use Authorization (EUA). This EUA will remain  in effect (meaning this test can be used) for the duration of the COVID-19 declaration under Section 564(b)(1) of the Act, 21 U.S.C. section 360bbb-3(b)(1), unless the authorization is terminated or revoked sooner.    Influenza A by PCR NEGATIVE NEGATIVE Final   Influenza B by PCR NEGATIVE NEGATIVE Final    Comment: (NOTE) The Xpert Xpress SARS-CoV-2/FLU/RSV assay is intended as an aid in  the diagnosis of influenza from Nasopharyngeal swab specimens and  should not be used as a sole basis for treatment. Nasal washings and  aspirates are unacceptable for Xpert Xpress SARS-CoV-2/FLU/RSV  testing. Fact Sheet for Patients: https://www.moore.com/ Fact Sheet for Healthcare Providers: https://www.young.biz/ This test is not yet approved or cleared by the [place] FDA and  has been authorized for detection and/or diagnosis of SARS-CoV-2 by  FDA under an Emergency Use Authorization (EUA). This EUA will remain  in effect (meaning this test can  be used) for the duration of the  Covid-19 declaration under Section 564(b)(1) of the Act, 21  U.S.C. section 360bbb-3(b)(1), unless the authorization is  terminated or revoked. Performed at [facility], [address] [place], [place] [address]   MRSA PCR Screening     Status: None   Collection Time: 03/27/20 10:02 PM   Specimen: Nasopharyngeal  Result Value Ref Range Status   MRSA by PCR NEGATIVE NEGATIVE Final    Comment:        The GeneXpert MRSA Assay (FDA approved for NASAL specimens only), is one component of a comprehensive MRSA colonization surveillance program. It is not intended to diagnose MRSA infection nor to guide or monitor treatment for MRSA infections. Performed at [facility], [address] [place], [place] [address]      Studies: EEG  Result Date: 03/28/2020 Charlsie Quest, MD     03/28/2020  1:31 PM Patient Name: Alyssa Wong MRN: [id number] Epilepsy Attending: Charlsie Quest Referring Physician/Provider: Dr. Caryl Pina Date: 03/28/2020 Duration: 26.03 minutes Patient history: 29 year old female with history of cerebral palsy and seizures who presented with increased lethargy in the setting of hyperammonemia on Depakote.  EEG to evaluate for seizures. Level of alertness: Awake AEDs during EEG study: Keppra, lamotrigine Technical aspects: This EEG study was done with scalp electrodes positioned according to the  10-20 International system of electrode placement. Electrical activity was acquired at a sampling rate of 500Hz  and reviewed with a high frequency filter of 70Hz  and a low frequency filter of 1Hz . EEG data were recorded continuously and digitally stored. Description: No clear posterior dominant rhythm was seen.  EEG showed continuous generalized sharply contoured 3 to 5 Hz theta-delta slowing in right hemisphere, maximal right temporoparietal region.  Sharp waves, at times periodic at 1 Hz were also noted in the  right temporoparietal region.  There was also 6 to 9 Hz theta-alpha activity in left hemisphere.  Hyperventilation and photic stimulation were not performed. Abnormality -Sharp waves, right temporoparietal region -Continuous slow, generalized and lateralized right hemisphere, maximal right temporoparietal region IMPRESSION: This study showed evidence of epileptogenicity as well as cortical dysfunction in the right temporoparietal region consistent with underlying craniotomy.  Additionally, there is evidence of mild to moderate diffuse encephalopathy, nonspecific etiology but likely related to patient's history of cerebral palsy and intellectual delay. No seizures were seen throughout the recording. Priyanka Nicanor Bake Yadav     Qaadir Kent, MD  [facility] 03/30/2020

## 2020-03-30 NOTE — TOC Progression Note (Signed)
Transition of Care [facility]) - Progression Note    Patient Details  Name: Alyssa Wong MRN: [id number] Date of Birth: June 08, 1991  Transition of Care [facility]) CM/SW Contact  Golda Acre, RN Phone Number: 03/30/2020, 11:28 AM  Clinical Narrative:    Now on p.o. keppra from iv, Iv procephin, iv vanco,neuro checks q4hrs, iv d5ns at 75cc/hrs, if improved and tolerates po Keppra should be able to move out to the reg floors.        Expected Discharge Plan and Services                                                 Social Determinants of Health (SDOH) Interventions    Readmission Risk Interventions No flowsheet data found.

## 2020-03-31 LAB — CBC
HCT: 41.1 % (ref 36.0–46.0)
Hemoglobin: 13 g/dL (ref 12.0–15.0)
MCH: 28.7 pg (ref 26.0–34.0)
MCHC: 31.6 g/dL (ref 30.0–36.0)
MCV: 90.7 fL (ref 80.0–100.0)
Platelets: 210 10*3/uL (ref 150–400)
RBC: 4.53 MIL/uL (ref 3.87–5.11)
RDW: 12.7 % (ref 11.5–15.5)
WBC: 6.5 10*3/uL (ref 4.0–10.5)
nRBC: 0 % (ref 0.0–0.2)

## 2020-03-31 LAB — VANCOMYCIN, TROUGH: Vancomycin Tr: <4 ug/mL — ABNORMAL LOW (ref 15–20)

## 2020-03-31 LAB — CULTURE, BLOOD (ROUTINE X 2)
Culture: NO GROWTH
Culture: NO GROWTH
Special Requests: ADEQUATE

## 2020-03-31 LAB — COMPREHENSIVE METABOLIC PANEL
ALT: 187 U/L — ABNORMAL HIGH (ref 0–44)
AST: 52 U/L — ABNORMAL HIGH (ref 15–41)
Albumin: 3.2 g/dL — ABNORMAL LOW (ref 3.5–5.0)
Alkaline Phosphatase: 50 U/L (ref 38–126)
Anion gap: 10 (ref 5–15)
BUN: 10 mg/dL (ref 6–20)
CO2: 24 mmol/L (ref 22–32)
Calcium: 8.3 mg/dL — ABNORMAL LOW (ref 8.9–10.3)
Chloride: 101 mmol/L (ref 98–111)
Creatinine, Ser: 0.47 mg/dL (ref 0.44–1.00)
GFR calc Af Amer: >60 mL/min (ref >60)
GFR calc non Af Amer: >60 mL/min (ref >60)
Glucose, Bld: 159 mg/dL — ABNORMAL HIGH (ref 70–99)
Potassium: 4.1 mmol/L (ref 3.5–5.1)
Sodium: 135 mmol/L (ref 135–145)
Total Bilirubin: 0.3 mg/dL (ref 0.3–1.2)
Total Protein: 6.7 g/dL (ref 6.5–8.1)

## 2020-03-31 LAB — MAGNESIUM: Magnesium: 2 mg/dL (ref 1.7–2.4)

## 2020-03-31 LAB — AMMONIA: Ammonia: 56 umol/L — ABNORMAL HIGH (ref 9–35)

## 2020-03-31 LAB — GLUCOSE, CAPILLARY: Glucose-Capillary: 155 mg/dL — ABNORMAL HIGH (ref 70–99)

## 2020-03-31 MED ORDER — LORAZEPAM 2 MG/ML IJ SOLN
1.0000 mg | Freq: Once | INTRAMUSCULAR | Status: AC
  Filled 2020-03-31: qty 1

## 2020-03-31 MED ORDER — VANCOMYCIN HCL 750 MG/150ML IV SOLN
750.0000 mg | Freq: Two times a day (BID) | INTRAVENOUS | Status: DC
  Administered 2020-04-01 – 2020-04-04 (×7): 750 mg via INTRAVENOUS
  Filled 2020-03-31 (×8): qty 150

## 2020-03-31 MED ORDER — LORAZEPAM 2 MG/ML IJ SOLN
INTRAMUSCULAR | Status: AC
  Administered 2020-03-31: 2 mg
  Filled 2020-03-31: qty 1

## 2020-03-31 MED ORDER — LEVETIRACETAM 100 MG/ML PO SOLN
1000.0000 mg | Freq: Two times a day (BID) | ORAL | Status: DC
  Administered 2020-03-31 – 2020-04-04 (×9): 1000 mg
  Filled 2020-03-31 (×10): qty 10

## 2020-03-31 MED ORDER — LAMOTRIGINE 25 MG PO TABS
50.0000 mg | ORAL_TABLET | Freq: Every day | ORAL | Status: DC
  Administered 2020-03-31 – 2020-04-03 (×4): 50 mg via ORAL
  Filled 2020-03-31 (×4): qty 2

## 2020-03-31 NOTE — Progress Notes (Signed)
Pharmacy Antibiotic Note  Alyssa Wong is a 29 y.o. female admitted on 03/26/2020 with VP shunt infection.  Pharmacy has been consulted for Vancomycin dosing.  Vancomycin trough < 4 with prior doses of vancomycin 1gm iv q24hr charted correctly.  5/7 PM dose of vancomycin started after level drawn.  Plan: Change vancomycin to 750mg  iv q12hr, next dose 1000 5/8.  Height: 5\' 2"  (157.5 cm) Weight: 48.3 kg (106 lb 7.7 oz) IBW/kg (Calculated) : 50.1  Temp (24hrs), Avg:98.5 F (36.9 C), Min:97.9 F (36.6 C), Max:99.3 F (37.4 C)  Recent Labs  Lab 03/26/20 1608 03/26/20 1625 03/26/20 1633 03/26/20 1825 03/26/20 2333 03/27/20 0543 03/28/20 0210 03/28/20 0238 03/29/20 0221 03/30/20 0230 03/31/20 0302 03/31/20 2136  WBC   < >  --   --   --   --  11.5* 8.6  --  7.1 9.0 6.5  --   CREATININE   < >  --    < >  --   --  0.46  --  0.46 0.43* 0.47 0.47  --   LATICACIDVEN  --  1.6  --  2.4* 1.3  --   --   --   --   --   --   --   VANCOTROUGH  --   --   --   --   --   --   --   --   --   --   --  <4*   < > = values in this interval not displayed.    Estimated Creatinine Clearance: 78.4 mL/min (by C-G formula based on SCr of 0.47 mg/dL).    No Known Allergies  No Known Allergies Antimicrobials this admission: 5/2 vanc >>  5/2 cefepime >> 5/6 5/6 ceftriaxone >>  Dose adjustments this admission: Vancomycin 1gm iv q24hr --> Vt = < 4.  Prior doses charted. Change to 750mg  iv q12hr, after 1gm dose charted 5/7 PM.   Microbiology results:  5/2 BCx: NGTD 5/2 UCx: NGF 5/2: COVID/influenza PCR: negative 5/3 MRSA neg 5/2 HIV NR Thank you for allowing pharmacy to be a part of this patient's care.  7/7 [place] 03/31/2020 11:31 PM

## 2020-03-31 NOTE — TOC Progression Note (Signed)
Transition of Care [facility]) - Progression Note    Patient Details  Name: Alyssa Wong MRN: [id number] Date of Birth: 12-May-1991  Transition of Care [facility]) CM/SW Contact  Golda Acre, RN Phone Number: 03/31/2020, 7:49 AM  Clinical Narrative:    Started on po keppra [id number] reports seizure like activity given 1 mg Ativan with cessation.  Neuro checks q4hrs.        Expected Discharge Plan and Services                                                 Social Determinants of Health (SDOH) Interventions    Readmission Risk Interventions No flowsheet data found.

## 2020-03-31 NOTE — Progress Notes (Signed)
RN paged NP regarding 2 episodes of seizure like activity and desats into 70s.  Third episode observed upon reaching patient room.  Pt with frequent stooling. Ordered 1mg  of ativan IVas 4th episode of seizure like aactivity started, added 2nd mg of Ativan with improvement.  Increase in O2 sats, decrease in heart rate and BP.  Observed pt additional 20 minutes with no further seizure like activity.      Neurology is following patient and changing depakote for keppra.

## 2020-03-31 NOTE — Progress Notes (Signed)
Pharmacy Antibiotic Note  Alyssa Wong is a 29 y.o. female admitted on 03/26/2020 with altered mental status/lethargy.  Pharmacy has been consulted for vancomycin dosing. Treating possible VP shunt infection  03/31/2020 D#5 (full) empiric vancomycin & cefepime to ceftriaxone WBC WNL, SCr WNL, AFeb, no positive culture data - Unable to do MRI as unknown if shunt compatible with MRI - Too late into antibiotic treatment to check LP/CSF as antibiotics likely effect results  Plan:  Continue vancomycin 1gm IV q24h for possible VP shunt infection and inability to confirm infection at this time  Plan for vancomycin trough this evening (goal 15-20 mcg/mL)  Ceftriaxone 2gm IV q12h per neurology/hospitalist  Monitor renal function  Height: 5\' 2"  (157.5 cm) Weight: 48.3 kg (106 lb 7.7 oz) IBW/kg (Calculated) : 50.1  Temp (24hrs), Avg:98.5 F (36.9 C), Min:97.7 F (36.5 C), Max:99.3 F (37.4 C)  Recent Labs  Lab 03/26/20 1608 03/26/20 1625 03/26/20 1633 03/26/20 1825 03/26/20 2333 03/27/20 0543 03/28/20 0210 03/28/20 0238 03/29/20 0221 03/30/20 0230 03/31/20 0302  WBC   < >  --   --   --   --  11.5* 8.6  --  7.1 9.0 6.5  CREATININE   < >  --    < >  --   --  0.46  --  0.46 0.43* 0.47 0.47  LATICACIDVEN  --  1.6  --  2.4* 1.3  --   --   --   --   --   --    < > = values in this interval not displayed.    Estimated Creatinine Clearance: 78.4 mL/min (by C-G formula based on SCr of 0.47 mg/dL).    No Known Allergies Antimicrobials this admission: 5/2 vanc >>  5/2 cefepime >> 5/6 5/6 ceftriaxone >> Dose adjustments this admission:  Microbiology results:  5/2 BCx: NGTD 5/2 UCx: NGF 5/2: COVID/influenza PCR: negative 5/3 MRSA neg 5/2 HIV NR  Thank you for allowing pharmacy to be a part of this patient's care . 7/2, PharmD, BCPS.   Work Cell: [phone] 03/31/2020 9:02 AM

## 2020-03-31 NOTE — Progress Notes (Signed)
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 Patient is nonverbal and was profoundly more lethargic than her baseline.  There was some mention of prominence of the left eye with redness.  Patient was then admitted to the hospital for lethargy, metabolic encephalopathy.  Assessment/Plan:  Active Problems:   S/P VP shunt   Crouzon syndrome   SIRS (systemic inflammatory response syndrome) (HCC)   Altered mental status   Hypertensive urgency   Status post insertion of percutaneous endoscopic gastrostomy (PEG) tube (HCC)   Hyperammonemia (HCC)   Lactic acidosis   Metabolic encephalopathy  Metabolic encephalopathy.  Could be secondary to hyperammonemia from Depakote.  History of seizures.  Continue Lamictal and Keppra. Off depakote. Seizure precautions.  EEG with encephalopathy. Unable to obtain MRI and at this time will discontinue. Spoke with neurology about it.   On  IV vancomycin, Rocephin. Cefepime has been discontinued for possible neurotoxicity. COVID was negative.  Blood cultures negative so far.  T max of 98.8F. Spoke with neurology yesterday and the plan is to continue antibiotic empirically for now. Ammonia level at 56. Magnesium 2.0.  Breakthrough seizures yesterday. Lamictal level low. Neurology on board. Dose has been increased. Will adjust Keppra dose to 1000 mg twice a day again. We will close monitor. Received Ativan yesterday.  Diarrhea. On lactulose. Monitor and adjust dose for bowel movements 2 times a day.  Left eye conjunctivitis.   Continue antibiotic drops. Improving.  History of seizures.  Off Depakote at this time.  On Lamictal and Keppra.  Neurology on board. Lamictal dose adjusted today.  Elevated LFTs with hyperammonemia likely secondary to Depakote.   Closely monitor.  LFTs trending down. Off Depakote at this time.  Continue lactulose for BM 2-3 times a day.   Crouzon syndrome status post VP shunt.  Status post PEG tube placement.  Elevated BP. On presentation. Not on any antihypertensives at home. On labetalol.  Lactic acidosis.  Resolved..   Nutrition.  PEG tube with bolus feeds.  Nutritionist on board. Tolerating ok.  Hypophosphatemia.  Improved after replacement.  VTE Prophylaxis: Lovenox subcu  Code Status: Full code  Family Communication: None today. I spoke with the patient's mother on the phone yesterday.  Status is: Inpatient  The patient will require  inpatient because: Altered mental status, Unsafe d/c plan, IV treatments appropriate due to intensity of illness or inability to take PO and further neurological follow-up and observation  Dispo: The patient is from: Home              Anticipated d/c is to: Home              Anticipated d/c date is: 2-3 days              Patient currently is not medically stable to d/c.  Consultants:  Neurology  Procedures:  EEG  Antibiotics:  . Rocephin, Vancomycin iv  Anti-infectives (From admission, onward)   Start     Dose/Rate Route Frequency Ordered Stop   03/30/20 1400  cefTRIAXone (ROCEPHIN) 2 g in sodium chloride  0.9 % 100 mL IVPB     2 g 200 mL/hr over 30 Minutes Intravenous Every 12 hours 03/30/20 1118     03/26/20 2200  ceFEPIme (MAXIPIME) 2 g in sodium chloride 0.9 % 100 mL IVPB  Status:  Discontinued     2 g 200 mL/hr over 30 Minutes Intravenous Every 8 hours 03/26/20 2055 03/30/20 1118   03/26/20 2200  vancomycin (VANCOCIN) IVPB 1000 mg/200 mL premix     1,000 mg 200 mL/hr over 60 Minutes Intravenous Every 24 hours 03/26/20 2101        Subjective: Today, patient was seen and examined at bedside.. Had few episodes of seizure midnight. Received Ativan x2 doses. No seizure activity this morning.  Objective: Vitals:   03/31/20 0900 03/31/20 1000  BP: (!) 129/101 (!) 145/114  Pulse: 99 93  Resp: 14 15  Temp:    SpO2: 100% 100%    Intake/Output Summary (Last 24 hours) at 03/31/2020 1050 Last data filed at 03/31/2020 1003 Gross per 24 hour  Intake 3163.04 ml  Output 1200 ml  Net 1963.04 ml   Filed Weights   03/28/20 0500 03/30/20 0500 03/31/20 0500  Weight: 47.3 kg 49.4 kg 48.3 kg   Body mass index is 19.48 kg/m.   Physical Exam:  General: Nonverbal, not in obvious distress, HENT: Normocephalic, pupils equally reacting to light and accommodation.  Bilateral proptosis with mild conjunctival injection on the left side. Chest:  Clear breath sounds.  Diminished breath sounds bilaterally. No crackles or wheezes.  CVS: S1 &S2 heard. No murmur.  Regular rate and rhythm. Abdomen: Soft, nontender, nondistended.  Bowel sounds are heard.  PEG tube in place extremities: No cyanosis, clubbing or edema.  Peripheral pulses are palpable. Psych: Nonverbal, appears somnolent CNS:.  Somnolent,non verbal Skin: Warm and dry.  No rashes noted.  Data Review: I have personally reviewed the following laboratory data and studies,  CBC: Recent Labs  Lab 03/26/20 1608 03/26/20 1633 03/27/20 0543 03/28/20 0210 03/29/20 0221 03/30/20 0230 03/31/20 0302  WBC 9.1   < > 11.5* 8.6 7.1 9.0 6.5  NEUTROABS 6.3  --  7.1  --   --   --   --   HGB 15.2*   < > 16.1* 16.2* 13.6 12.5 13.0  HCT 46.9*   < > 48.7* 48.3* 42.7 39.2 41.1  MCV 87.5   < > 86.0 85.9 88.8 89.5 90.7  PLT 225   < > 256 165 187 179 210   < > = values in this interval not displayed.   Basic Metabolic Panel: Recent Labs  Lab 03/27/20 0543 03/28/20 0238 03/29/20 0221 03/30/20 0230 03/31/20 0302  NA 125* 129* 137 135 135  K 4.0 4.1 3.5 3.8 4.1  CL 88* 96* 103  102 101  CO2 26 25 26 24 24   GLUCOSE 93 132* 155* 117* 159*  BUN 10 10 11 9 10   CREATININE 0.46 0.46 0.43* 0.47 0.47  CALCIUM 8.6* 7.9* 7.9* 8.0* 8.3*  MG 1.8 2.0 2.3 2.0 2.0  PHOS  --  2.7 2.3* 3.0  --    Liver Function Tests: Recent Labs  Lab 03/26/20 1608 03/27/20 0543 03/28/20 0238 03/29/20 0221 03/31/20 0302  AST 90* 151* 234* 124* 52*  ALT 145* 235* 418* 334* 187*  ALKPHOS 44 43 38 41 50  BILITOT 0.4 0.8 1.3* 0.5 0.3  PROT 7.5 7.3 6.3* 6.2* 6.7  ALBUMIN 3.7 3.5 2.9* 3.0* 3.2*   No results for input(s): LIPASE, AMYLASE  in the last 168 hours. Recent Labs  Lab 03/26/20 1609 03/28/20 0904 03/29/20 0950 03/31/20 0302  AMMONIA 107* 70* 43* 56*   Cardiac Enzymes: No results for input(s): CKTOTAL, CKMB, CKMBINDEX, TROPONINI in the last 168 hours. BNP (last 3 results) No results for input(s): BNP in the last 8760 hours.  ProBNP (last 3 results) No results for input(s): PROBNP in the last 8760 hours.  CBG: Recent Labs  Lab 03/26/20 1614 03/27/20 0117 03/27/20 0804 03/27/20 1208 03/31/20 0001  GLUCAP 147* 116* 91 81 155*   Recent Results (from the past 240 hour(s))  Urine culture     Status: None   Collection Time: 03/26/20  4:09 PM   Specimen: Urine, Clean Catch  Result Value Ref Range Status   Specimen Description   Final    URINE, CLEAN CATCH Performed at [facility], [address], [place], [place] [address]    Special Requests   Final    NONE Performed at [facility], [address] [place], [place] [address]    Culture   Final    NO GROWTH Performed at [facility], [address], [place], [place] [address]    Report Status 03/27/2020 FINAL  Final  Culture, blood (routine x 2)     Status: None   Collection Time: 03/26/20  4:27 PM   Specimen: BLOOD  Result Value Ref Range Status   Specimen Description   Final    BLOOD RIGHT HAND Performed at [facility], [address] [place], [place] [address]    Special Requests   Final    BOTTLES DRAWN AEROBIC AND ANAEROBIC Blood Culture adequate volume Performed at [facility], [address], [place], [place] [address]    Culture   Final    NO GROWTH 5 DAYS Performed at [facility], [address], [place], [place] [address]    Report Status 03/31/2020 FINAL  Final  Culture, blood (routine x 2)     Status: None   Collection Time: 03/26/20  4:27 PM   Specimen: BLOOD  Result Value Ref Range Status   Specimen Description   Final    BLOOD LEFT HAND Performed at [facility], [address], [place], [place] [address]    Special Requests   Final    BOTTLES DRAWN AEROBIC AND ANAEROBIC Blood Culture results may not be optimal due to an inadequate volume of blood received in culture bottles Performed at [facility], [address] [place], [place] [address]    Culture   Final    NO GROWTH 5 DAYS Performed at [facility], [address], [place], [place] [address]    Report Status 03/31/2020 FINAL  Final  Respiratory Panel by RT PCR (Flu A&B, Covid) - Nasopharyngeal Swab     Status: None   Collection Time: 03/26/20  6:04 PM   Specimen: Nasopharyngeal Swab  Result Value Ref Range Status   SARS Coronavirus 2 by RT PCR NEGATIVE NEGATIVE Final    Comment: (NOTE) SARS-CoV-2 target nucleic acids are NOT DETECTED. The SARS-CoV-2 RNA is generally detectable in upper respiratoy specimens during the acute phase of infection. The lowest concentration of SARS-CoV-2 viral copies this assay can detect is 131 copies/mL. A negative result does not preclude SARS-Cov-2 infection and should not be used as the sole basis for treatment or other patient management decisions. A negative result may occur with  improper specimen collection/handling, submission of specimen other than nasopharyngeal swab, presence of viral mutation(s) within the areas targeted  by this assay, and inadequate number of viral copies (<131 copies/mL). A negative result must be combined with clinical observations, patient history, and epidemiological information. The expected result is Negative. Fact Sheet for Patients:  https://www.moore.com/ Fact Sheet for Healthcare Providers:  https://www.young.biz/ This test is not yet ap proved or cleared by the [place] FDA and  has been authorized for detection and/or diagnosis of SARS-CoV-2 by FDA under an Emergency Use Authorization (EUA). This EUA will remain  in effect (meaning this test can be used) for the duration of the COVID-19 declaration under Section 564(b)(1) of the Act, 21 U.S.C. section 360bbb-3(b)(1), unless the authorization is terminated or revoked sooner.    Influenza A by PCR NEGATIVE NEGATIVE Final   Influenza B by PCR NEGATIVE NEGATIVE Final    Comment: (NOTE) The Xpert Xpress SARS-CoV-2/FLU/RSV assay is intended as an aid in  the diagnosis of influenza from Nasopharyngeal swab specimens and  should not be used as a sole basis for treatment. Nasal washings and  aspirates are unacceptable for Xpert Xpress SARS-CoV-2/FLU/RSV  testing. Fact Sheet for Patients: https://www.moore.com/ Fact Sheet for Healthcare Providers: https://www.young.biz/ This test is not yet approved or cleared by the [place] FDA and  has been authorized for detection and/or diagnosis of SARS-CoV-2 by  FDA under an Emergency Use Authorization (EUA). This EUA will remain  in effect (meaning this test can be used) for the duration of the  Covid-19 declaration under Section 564(b)(1) of the Act, 21  U.S.C. section 360bbb-3(b)(1), unless the authorization is  terminated or revoked. Performed at [facility], [address] [place], [place] [address]   MRSA PCR Screening     Status: None   Collection Time: 03/27/20 10:02 PM     Specimen: Nasopharyngeal  Result Value Ref Range Status   MRSA by PCR NEGATIVE NEGATIVE Final    Comment:        The GeneXpert MRSA Assay (FDA approved for NASAL specimens only), is one component of a comprehensive MRSA colonization surveillance program. It is not intended to diagnose MRSA infection nor to guide or monitor treatment for MRSA infections. Performed at [facility], [address] [place], [place] [address]      Studies: No results found.   Joycelyn Das, MD  [facility] 03/31/2020

## 2020-03-31 NOTE — Progress Notes (Signed)
Lamictal level from blood draw on 5/5 resulted at 3.6, which is at the low end of the therapeutic range. Increasing total daily Lamictal dose by 30% (now 50 mg BID, increased from 25 qam, 50 qhs).   Have ordered a scheduled blood draw for repeat Lamictal level on Saturday AM.   Electronically signed: Dr. Caryl Pina

## 2020-03-31 NOTE — Progress Notes (Signed)
RN noticed the pt having seizure-like convulsing activity. RN placed pt on side, suctioned and placed nasal cannula on pt. On call MD paged. Pt then had 3 more seizure like episodes. Each one lasting 45-90 seconds long. HR was up to 150s. MD arrived to pt room & ordered ativan. Ativan 2 mg given. Pt now resting & back to baseline. RN will continue to monitor closely.

## 2020-04-01 LAB — MAGNESIUM: Magnesium: 2 mg/dL (ref 1.7–2.4)

## 2020-04-01 LAB — COMPREHENSIVE METABOLIC PANEL
ALT: 131 U/L — ABNORMAL HIGH (ref 0–44)
AST: 44 U/L — ABNORMAL HIGH (ref 15–41)
Albumin: 2.9 g/dL — ABNORMAL LOW (ref 3.5–5.0)
Alkaline Phosphatase: 47 U/L (ref 38–126)
Anion gap: 9 (ref 5–15)
BUN: 9 mg/dL (ref 6–20)
CO2: 26 mmol/L (ref 22–32)
Calcium: 8.4 mg/dL — ABNORMAL LOW (ref 8.9–10.3)
Chloride: 99 mmol/L (ref 98–111)
Creatinine, Ser: 0.37 mg/dL — ABNORMAL LOW (ref 0.44–1.00)
GFR calc Af Amer: >60 mL/min (ref >60)
GFR calc non Af Amer: >60 mL/min (ref >60)
Glucose, Bld: 138 mg/dL — ABNORMAL HIGH (ref 70–99)
Potassium: 4.5 mmol/L (ref 3.5–5.1)
Sodium: 134 mmol/L — ABNORMAL LOW (ref 135–145)
Total Bilirubin: 0.6 mg/dL (ref 0.3–1.2)
Total Protein: 6.4 g/dL — ABNORMAL LOW (ref 6.5–8.1)

## 2020-04-01 LAB — CBC
HCT: 41.5 % (ref 36.0–46.0)
Hemoglobin: 13 g/dL (ref 12.0–15.0)
MCH: 28.9 pg (ref 26.0–34.0)
MCHC: 31.3 g/dL (ref 30.0–36.0)
MCV: 92.2 fL (ref 80.0–100.0)
Platelets: 200 10*3/uL (ref 150–400)
RBC: 4.5 MIL/uL (ref 3.87–5.11)
RDW: 12.7 % (ref 11.5–15.5)
WBC: 6.5 10*3/uL (ref 4.0–10.5)
nRBC: 0 % (ref 0.0–0.2)

## 2020-04-01 LAB — LACTIC ACID, PLASMA: Lactic Acid, Venous: 1.8 mmol/L (ref 0.5–1.9)

## 2020-04-01 LAB — PHOSPHORUS: Phosphorus: 3.5 mg/dL (ref 2.5–4.6)

## 2020-04-01 MED ORDER — LORAZEPAM 2 MG/ML IJ SOLN: 2.0000 mg | INTRAMUSCULAR | Status: DC | PRN

## 2020-04-01 MED ORDER — AMLODIPINE 1 MG/ML ORAL SUSPENSION
10.0000 mg | Freq: Every day | ORAL | Status: DC
  Administered 2020-04-02 – 2020-04-04 (×2): 10 mg
  Filled 2020-04-01 (×4): qty 10

## 2020-04-01 NOTE — Progress Notes (Signed)
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 Patient is nonverbal and was profoundly more lethargic than her baseline.  There was some mention of prominence of the left eye with redness.  Patient was then admitted to the hospital for lethargy, metabolic encephalopathy.  Assessment/Plan:  Active Problems:   S/P VP shunt   Crouzon syndrome   SIRS (systemic inflammatory response syndrome) (HCC)   Altered mental status   Hypertensive urgency   Status post insertion of percutaneous endoscopic gastrostomy (PEG) tube (HCC)   Hyperammonemia (HCC)   Lactic acidosis   Metabolic encephalopathy  Metabolic encephalopathy.  Could be secondary to hyperammonemia from Depakote.  History of seizures.  Continue Lamictal and Keppra. Off depakote. Seizure precautions.  EEG with encephalopathy. Unable to obtain MRI and at this time due to shunt material clarity difficulty and has been discontinued.   On  IV vancomycin, Rocephin empirically for possible shunt infection as per neurology recommendation.  Cefepime has been discontinued for possible neurotoxicity. COVID was negative.  Blood cultures negative so far.  Ammonia level at 56. Magnesium 2.0.  Breakthrough seizures on 03/30/2020.  Lamictal level was low and neurology has adjusted the doses.  Continue Keppra.  We will keep Ativan as needed for breakthrough seizures.   Diarrhea. On lactulose. Monitor and adjust dose for bowel movements 2-3 times a day.  Left eye conjunctivitis.  Continue antibiotic drops.  Improving.  History of seizures.  Off Depakote at this time.  On Lamictal and Keppra.  Neurology on board. Lamictal dose has been adjusted.  Elevated LFTs with hyperammonemia likely secondary to Depakote.   Closely monitor.  LFTs trending down. Off Depakote at this time.  Continue lactulose for BM 2-3 times a day.   Crouzon syndrome status post VP shunt.  Status post PEG tube placement.  Elevated BP. On presentation. Not on any antihypertensives at home. On amlodipine, labetalol.  We will increase the dose of amlodipine to 10 mg daily.  Lactic acidosis.  Resolved.  Nutrition.  PEG tube with bolus feeds.  Nutritionist on board.  Tolerating bolus feeding.  Hypophosphatemia.  Improved after replacement.  Discontinue K-Phos  VTE Prophylaxis: Lovenox subcu  Code Status: Full code  Family Communication: None today.  Status is: Inpatient  The patient will require  inpatient because: Altered mental status, Unsafe d/c plan, IV treatments appropriate due to intensity of illness or inability to take PO and further neurological follow-up   Dispo: The patient is from: Home              Anticipated d/c is to: Home              Anticipated d/c date is: 2-3 days              Patient currently is not medically stable to d/c.  Consultants:  Neurology  Procedures:  EEG  Antibiotics:  . Rocephin, Vancomycin iv  Anti-infectives (From admission, onward)   Start     Dose/Rate Route Frequency Ordered Stop   04/01/20 1000  vancomycin (VANCOREADY) IVPB 750 mg/150 mL  750 mg 150 mL/hr over 60 Minutes Intravenous Every 12 hours 03/31/20 2324     03/30/20 1400  cefTRIAXone (ROCEPHIN) 2 g in sodium chloride 0.9 % 100 mL IVPB     2 g 200 mL/hr over 30 Minutes Intravenous Every 12 hours 03/30/20 1118     03/26/20 2200  ceFEPIme (MAXIPIME) 2 g in sodium chloride 0.9 % 100 mL IVPB  Status:  Discontinued     2 g 200 mL/hr over 30 Minutes Intravenous Every 8 hours 03/26/20 2055 03/30/20 1118    03/26/20 2200  vancomycin (VANCOCIN) IVPB 1000 mg/200 mL premix  Status:  Discontinued     1,000 mg 200 mL/hr over 60 Minutes Intravenous Every 24 hours 03/26/20 2101 03/31/20 2323     Subjective: Today, patient was seen and examined at bedside.  No further report of seizures.  Has been having some loose stools.  Seems to be moaning and moving her extremities more than usual.  Patient's mother had stated the nursing staff that she is not at her baseline yet . Objective: Vitals:   04/01/20 0900 04/01/20 1000  BP:  (!) 140/102  Pulse: 98 (!) 108  Resp: 14 17  Temp:    SpO2: 99% 99%    Intake/Output Summary (Last 24 hours) at 04/01/2020 1230 Last data filed at 04/01/2020 1000 Gross per 24 hour  Intake 1811.65 ml  Output 403 ml  Net 1408.65 ml   Filed Weights   03/30/20 0500 03/31/20 0500 04/01/20 0500  Weight: 49.4 kg 48.3 kg 50.5 kg   Body mass index is 20.36 kg/m.   Physical Exam:  General: Nonverbal, not in obvious distress, HENT: Normocephalic, pupils equally reacting to light and accommodation.  Bilateral proptosis with mild conjunctival injection on the left side. Chest:  Clear breath sounds.  Diminished breath sounds bilaterally. No crackles or wheezes.  CVS: S1 &S2 heard. No murmur.  Regular rate and rhythm. Abdomen: Soft, nontender, nondistended.  Bowel sounds are heard.  PEG tube in place extremities: No cyanosis, clubbing or edema.  Peripheral pulses are palpable. Psych: Nonverbal, appears somnolent CNS:.  Somnolent,non verbal.  No neck stiffness noted Skin: Warm and dry.  No rashes noted.  Data Review: I have personally reviewed the following laboratory data and studies,  CBC: Recent Labs  Lab 03/26/20 1608 03/26/20 1633 03/27/20 0543 03/27/20 0543 03/28/20 0210 03/29/20 0221 03/30/20 0230 03/31/20 0302 04/01/20 0230  WBC 9.1   < > 11.5*   < > 8.6 7.1 9.0 6.5 6.5  NEUTROABS 6.3  --  7.1  --   --   --   --   --   --   HGB 15.2*   < > 16.1*   < > 16.2*  13.6 12.5 13.0 13.0  HCT 46.9*   < > 48.7*   < > 48.3* 42.7 39.2 41.1 41.5  MCV 87.5   < > 86.0   < > 85.9 88.8 89.5 90.7 92.2  PLT 225   < > 256   < > 165 187 179 210 200   < > = values in this interval not displayed.   Basic Metabolic Panel: Recent Labs  Lab 03/28/20 0238 03/29/20 0221 03/30/20 0230 03/31/20 0302 04/01/20 0230  NA 129* 137 135 135 134*  K 4.1 3.5 3.8 4.1 4.5  CL 96* 103 102 101 99  CO2 25 26 24 24 26   GLUCOSE 132* 155* 117* 159* 138*  BUN 10 11 9 10 9   CREATININE 0.46 0.43*  0.47 0.47 0.37*  CALCIUM 7.9* 7.9* 8.0* 8.3* 8.4*  MG 2.0 2.3 2.0 2.0 2.0  PHOS 2.7 2.3* 3.0  --  3.5   Liver Function Tests: Recent Labs  Lab 03/27/20 0543 03/28/20 0238 03/29/20 0221 03/31/20 0302 04/01/20 0230  AST 151* 234* 124* 52* 44*  ALT 235* 418* 334* 187* 131*  ALKPHOS 43 38 41 50 47  BILITOT 0.8 1.3* 0.5 0.3 0.6  PROT 7.3 6.3* 6.2* 6.7 6.4*  ALBUMIN 3.5 2.9* 3.0* 3.2* 2.9*   No results for input(s): LIPASE, AMYLASE in the last 168 hours. Recent Labs  Lab 03/26/20 1609 03/28/20 0904 03/29/20 0950 03/31/20 0302  AMMONIA 107* 70* 43* 56*   Cardiac Enzymes: No results for input(s): CKTOTAL, CKMB, CKMBINDEX, TROPONINI in the last 168 hours. BNP (last 3 results) No results for input(s): BNP in the last 8760 hours.  ProBNP (last 3 results) No results for input(s): PROBNP in the last 8760 hours.  CBG: Recent Labs  Lab 03/26/20 1614 03/27/20 0117 03/27/20 0804 03/27/20 1208 03/31/20 0001  GLUCAP 147* 116* 91 81 155*   Recent Results (from the past 240 hour(s))  Urine culture     Status: None   Collection Time: 03/26/20  4:09 PM   Specimen: Urine, Clean Catch  Result Value Ref Range Status   Specimen Description   Final    URINE, CLEAN CATCH Performed at [facility], [address] [place], [place] [address]    Special Requests   Final    NONE Performed at [facility], [address] [place], [place]  [address]    Culture   Final    NO GROWTH Performed at [facility], [address], [place], [place] [address]    Report Status 03/27/2020 FINAL  Final  Culture, blood (routine x 2)     Status: None   Collection Time: 03/26/20  4:27 PM   Specimen: BLOOD  Result Value Ref Range Status   Specimen Description   Final    BLOOD RIGHT HAND Performed at [facility], [address] [place], [place] [address]    Special Requests   Final    BOTTLES DRAWN AEROBIC AND ANAEROBIC Blood Culture adequate volume Performed at [facility], [address], [place], [place] [address]    Culture   Final    NO GROWTH 5 DAYS Performed at [facility], [address], [place], [place] [address]    Report Status 03/31/2020 FINAL  Final  Culture, blood (routine x 2)     Status: None   Collection Time: 03/26/20  4:27 PM   Specimen: BLOOD  Result Value Ref Range Status   Specimen Description   Final    BLOOD LEFT HAND Performed at [facility], [address], [place], [place] [address]    Special Requests   Final    BOTTLES DRAWN AEROBIC AND ANAEROBIC Blood Culture results may not be optimal due to an inadequate volume of blood received in culture bottles Performed at [facility], [address], [place], [place] [address]    Culture   Final    NO GROWTH 5 DAYS Performed at [facility], [address] [place], [place] [address]    Report Status 03/31/2020 FINAL  Final  Respiratory Panel by RT PCR (Flu A&B, Covid) - Nasopharyngeal Swab     Status: None   Collection Time: 03/26/20  6:04  PM   Specimen: Nasopharyngeal Swab  Result Value Ref Range Status   SARS Coronavirus 2 by RT PCR NEGATIVE NEGATIVE Final    Comment: (NOTE) SARS-CoV-2 target nucleic acids are NOT DETECTED. The SARS-CoV-2 RNA is generally detectable in upper respiratoy specimens during the acute phase of infection. The  lowest concentration of SARS-CoV-2 viral copies this assay can detect is 131 copies/mL. A negative result does not preclude SARS-Cov-2 infection and should not be used as the sole basis for treatment or other patient management decisions. A negative result may occur with  improper specimen collection/handling, submission of specimen other than nasopharyngeal swab, presence of viral mutation(s) within the areas targeted by this assay, and inadequate number of viral copies (<131 copies/mL). A negative result must be combined with clinical observations, patient history, and epidemiological information. The expected result is Negative. Fact Sheet for Patients:  PinkCheek.be Fact Sheet for Healthcare Providers:  GravelBags.it This test is not yet ap proved or cleared by the [place] FDA and  has been authorized for detection and/or diagnosis of SARS-CoV-2 by FDA under an Emergency Use Authorization (EUA). This EUA will remain  in effect (meaning this test can be used) for the duration of the COVID-19 declaration under Section 564(b)(1) of the Act, 21 U.S.C. section 360bbb-3(b)(1), unless the authorization is terminated or revoked sooner.    Influenza A by PCR NEGATIVE NEGATIVE Final   Influenza B by PCR NEGATIVE NEGATIVE Final    Comment: (NOTE) The Xpert Xpress SARS-CoV-2/FLU/RSV assay is intended as an aid in  the diagnosis of influenza from Nasopharyngeal swab specimens and  should not be used as a sole basis for treatment. Nasal washings and  aspirates are unacceptable for Xpert Xpress SARS-CoV-2/FLU/RSV  testing. Fact Sheet for Patients: PinkCheek.be Fact Sheet for Healthcare Providers: GravelBags.it This test is not yet approved or cleared by the [place] FDA and  has been authorized for detection and/or diagnosis of SARS-CoV-2 by  FDA under an Emergency  Use Authorization (EUA). This EUA will remain  in effect (meaning this test can be used) for the duration of the  Covid-19 declaration under Section 564(b)(1) of the Act, 21  U.S.C. section 360bbb-3(b)(1), unless the authorization is  terminated or revoked. Performed at [facility], [place] [address], [place], [place] [address]   MRSA PCR Screening     Status: None   Collection Time: 03/27/20 10:02 PM   Specimen: Nasopharyngeal  Result Value Ref Range Status   MRSA by PCR NEGATIVE NEGATIVE Final    Comment:        The GeneXpert MRSA Assay (FDA approved for NASAL specimens only), is one component of a comprehensive MRSA colonization surveillance program. It is not intended to diagnose MRSA infection nor to guide or monitor treatment for MRSA infections. Performed at [facility], [address] [place], [place] [address]      Studies: No results found.   Flora Lipps, MD  [facility] 04/01/2020

## 2020-04-02 LAB — CBC
HCT: 43.1 % (ref 36.0–46.0)
Hemoglobin: 13.6 g/dL (ref 12.0–15.0)
MCH: 28.6 pg (ref 26.0–34.0)
MCHC: 31.6 g/dL (ref 30.0–36.0)
MCV: 90.7 fL (ref 80.0–100.0)
Platelets: 230 10*3/uL (ref 150–400)
RBC: 4.75 MIL/uL (ref 3.87–5.11)
RDW: 12.6 % (ref 11.5–15.5)
WBC: 5.9 10*3/uL (ref 4.0–10.5)
nRBC: 0 % (ref 0.0–0.2)

## 2020-04-02 LAB — COMPREHENSIVE METABOLIC PANEL
ALT: 126 U/L — ABNORMAL HIGH (ref 0–44)
AST: 48 U/L — ABNORMAL HIGH (ref 15–41)
Albumin: 3.4 g/dL — ABNORMAL LOW (ref 3.5–5.0)
Alkaline Phosphatase: 50 U/L (ref 38–126)
Anion gap: 10 (ref 5–15)
BUN: 10 mg/dL (ref 6–20)
CO2: 27 mmol/L (ref 22–32)
Calcium: 9.4 mg/dL (ref 8.9–10.3)
Chloride: 100 mmol/L (ref 98–111)
Creatinine, Ser: 0.51 mg/dL (ref 0.44–1.00)
GFR calc Af Amer: >60 mL/min (ref >60)
GFR calc non Af Amer: >60 mL/min (ref >60)
Glucose, Bld: 102 mg/dL — ABNORMAL HIGH (ref 70–99)
Potassium: 5 mmol/L (ref 3.5–5.1)
Sodium: 137 mmol/L (ref 135–145)
Total Bilirubin: 0.4 mg/dL (ref 0.3–1.2)
Total Protein: 7.1 g/dL (ref 6.5–8.1)

## 2020-04-02 LAB — MAGNESIUM: Magnesium: 2.1 mg/dL (ref 1.7–2.4)

## 2020-04-02 LAB — AMMONIA: Ammonia: 38 umol/L — ABNORMAL HIGH (ref 9–35)

## 2020-04-02 LAB — PHOSPHORUS: Phosphorus: 4.3 mg/dL (ref 2.5–4.6)

## 2020-04-02 NOTE — Progress Notes (Addendum)
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 Patient is nonverbal and was profoundly more lethargic than her baseline.  There was some mention of prominence of the left eye with redness.  Patient was then admitted to the hospital for lethargy, metabolic encephalopathy.  Assessment/Plan:  Active Problems:   S/P VP shunt   Crouzon syndrome   SIRS (systemic inflammatory response syndrome) (HCC)   Altered mental status   Hypertensive urgency   Status post insertion of percutaneous endoscopic gastrostomy (PEG) tube (HCC)   Hyperammonemia (HCC)   Lactic acidosis   Metabolic encephalopathy  Metabolic encephalopathy.  Improving. Could be secondary to hyperammonemia from Depakote.  History of seizures.  Continue Lamictal and Keppra. Off depakote. Continue Seizure precautions.  EEG with encephalopathy. Unable to obtain MRI and at this time due to shunt material clarity difficulty and has been discontinued.   On  IV vancomycin, Rocephin empirically for possible shunt infection as per neurology recommendation.  Cefepime has been discontinued for possible neurotoxicity and has been started on rocephin. COVID was negative.  Blood cultures negative so far.  Ammonia level at 38. Magnesium 2.1  Breakthrough seizures on 03/30/2020.  Lamictal level was low and neurology has adjusted the doses.  Continue Keppra.  We will keep Ativan as needed for breakthrough seizures.   Diarrhea. On lactulose. Monitor and adjust dose for bowel movements 2-3 times a day.  Left eye  conjunctivitis.  Continue antibiotic drops. Improving.  History of seizures.  Off Depakote at this time.  On Lamictal and Keppra.  Neurology on board. Lamictal dose has been adjusted.  Elevated LFTs with hyperammonemia likely secondary to Depakote.   Closely monitor.  LFTs trending down. Off Depakote at this time.  Continue lactulose for BM 2-3 times a day.   Crouzon syndrome status post VP shunt.  Status post PEG tube placement.  Elevated BP. On presentation. Not on any antihypertensives at home. On amlodipine, labetalol.  Better today.  Lactic acidosis.  Resolved.  Nutrition.  PEG tube with bolus feeds.  Nutritionist on board.  Tolerating bolus feeding.  Hypophosphatemia.  Improved after replacement.  Phosphate of 4.3 today  VTE Prophylaxis: Lovenox subcu  Code Status: Full code  Family Communication: Spoke with the patient's mother Ms. Misty Stanley on the phone and updated her about the clinical condition of the patient  Status is: Inpatient  The patient will require  inpatient because: Altered mental status, Unsafe d/c plan, IV treatments appropriate due to intensity of illness or inability to take PO and further neurological follow-up, iv antibiotics, neuro followup  Dispo: The patient is from: Home              Anticipated d/c is to: Home              Anticipated d/c date is: 2-3 days              Patient currently is not medically stable to d/c.  Consultants:  Neurology  Procedures:  EEG  Antibiotics:  . Rocephin, Vancomycin iv  Anti-infectives (From admission, onward)  Start     Dose/Rate Route Frequency Ordered Stop   04/01/20 1000  vancomycin (VANCOREADY) IVPB 750 mg/150 mL     750 mg 150 mL/hr over 60 Minutes Intravenous Every 12 hours 03/31/20 2324     03/30/20 1400  cefTRIAXone (ROCEPHIN) 2 g in sodium chloride 0.9 % 100 mL IVPB     2 g 200 mL/hr over 30 Minutes Intravenous Every 12 hours 03/30/20 1118     03/26/20 2200  ceFEPIme (MAXIPIME) 2 g in sodium  chloride 0.9 % 100 mL IVPB  Status:  Discontinued     2 g 200 mL/hr over 30 Minutes Intravenous Every 8 hours 03/26/20 2055 03/30/20 1118   03/26/20 2200  vancomycin (VANCOCIN) IVPB 1000 mg/200 mL premix  Status:  Discontinued     1,000 mg 200 mL/hr over 60 Minutes Intravenous Every 24 hours 03/26/20 2101 03/31/20 2323     Subjective:  Today, patient was seen and examined at bedside.  Appears to be more responsive to verbal command today.  Moving extremities spontaneously.   Objective: Vitals:   04/02/20 0800 04/02/20 0819  BP: (!) 145/90   Pulse: 96   Resp: 14   Temp:  97.8 F (36.6 C)  SpO2: 98%     Intake/Output Summary (Last 24 hours) at 04/02/2020 1044 Last data filed at 04/02/2020 0942 Gross per 24 hour  Intake 2034.79 ml  Output 1200 ml  Net 834.79 ml   Filed Weights   03/31/20 0500 04/01/20 0500 04/02/20 0440  Weight: 48.3 kg 50.5 kg 47 kg   Body mass index is 18.95 kg/m.   Physical Exam:  General: Nonverbal, not in obvious distress, more alert and responsive on verbal command today. HENT:   No scleral pallor or icterus noted. Oral mucosa is moist.  Bilateral proptosis with conjunctival injection on the left side. Chest:  Clear breath sounds.  Diminished breath sounds bilaterally. No crackles or wheezes.  CVS: S1 &S2 heard. No murmur.  Regular rate and rhythm. Abdomen: Soft, nontender, nondistended.  Bowel sounds are heard.  Place. Extremities: No cyanosis, clubbing or edema.  Peripheral pulses are palpable. Psych: Responded physically to verbal command today.  Nonverbal, no neck stiffness. CNS: Moving some extremities.  Nonverbal. Skin: Warm and dry.  No rashes noted.  Data Review: I have personally reviewed the following laboratory data and studies,  CBC: Recent Labs  Lab 03/26/20 1608 03/26/20 1633 03/27/20 0543 03/28/20 0210 03/29/20 0221 03/30/20 0230 03/31/20 0302 04/01/20 0230 04/02/20 0333  WBC 9.1   < > 11.5*   < > 7.1 9.0 6.5 6.5 5.9    NEUTROABS 6.3  --  7.1  --   --   --   --   --   --   HGB 15.2*   < > 16.1*   < > 13.6 12.5 13.0 13.0 13.6  HCT 46.9*   < > 48.7*   < > 42.7 39.2 41.1 41.5 43.1  MCV 87.5   < > 86.0   < > 88.8 89.5 90.7 92.2 90.7  PLT 225   < > 256   < > 187 179 210 200 230   < > = values in this interval not displayed.   Basic Metabolic Panel: Recent Labs  Lab 03/28/20 0238 03/28/20 0238 03/29/20 0221 03/30/20 0230 03/31/20 0302 04/01/20 0230 04/02/20 0333  NA 129*   < > 137 135 135 134* 137  K 4.1   < > 3.5 3.8 4.1 4.5 5.0  CL  96*   < > 103 102 101 99 100  CO2 25   < > 26 24 24 26 27   GLUCOSE 132*   < > 155* 117* 159* 138* 102*  BUN 10   < > 11 9 10 9 10   CREATININE 0.46   < > 0.43* 0.47 0.47 0.37* 0.51  CALCIUM 7.9*   < > 7.9* 8.0* 8.3* 8.4* 9.4  MG 2.0   < > 2.3 2.0 2.0 2.0 2.1  PHOS 2.7  --  2.3* 3.0  --  3.5 4.3   < > = values in this interval not displayed.   Liver Function Tests: Recent Labs  Lab 03/28/20 0238 03/29/20 0221 03/31/20 0302 04/01/20 0230 04/02/20 0333  AST 234* 124* 52* 44* 48*  ALT 418* 334* 187* 131* 126*  ALKPHOS 38 41 50 47 50  BILITOT 1.3* 0.5 0.3 0.6 0.4  PROT 6.3* 6.2* 6.7 6.4* 7.1  ALBUMIN 2.9* 3.0* 3.2* 2.9* 3.4*   No results for input(s): LIPASE, AMYLASE in the last 168 hours. Recent Labs  Lab 03/26/20 1609 03/28/20 0904 03/29/20 0950 03/31/20 0302 04/02/20 0618  AMMONIA 107* 70* 43* 56* 38*   Cardiac Enzymes: No results for input(s): CKTOTAL, CKMB, CKMBINDEX, TROPONINI in the last 168 hours. BNP (last 3 results) No results for input(s): BNP in the last 8760 hours.  ProBNP (last 3 results) No results for input(s): PROBNP in the last 8760 hours.  CBG: Recent Labs  Lab 03/26/20 1614 03/27/20 0117 03/27/20 0804 03/27/20 1208 03/31/20 0001  GLUCAP 147* 116* 91 81 155*   Recent Results (from the past 240 hour(s))  Urine culture     Status: None   Collection Time: 03/26/20  4:09 PM   Specimen: Urine, Clean Catch  Result Value Ref  Range Status   Specimen Description   Final    URINE, CLEAN CATCH Performed at [facility], [place] [address] [place], [place] [address]    Special Requests   Final    NONE Performed at [facility], [place] [address] [place], [place] [address]    Culture   Final    NO GROWTH Performed at [facility], [place] [address], [place], [place] [address]    Report Status 03/27/2020 FINAL  Final  Culture, blood (routine x 2)     Status: None   Collection Time: 03/26/20  4:27 PM   Specimen: BLOOD  Result Value Ref Range Status   Specimen Description   Final    BLOOD RIGHT HAND Performed at [place] [address], [place], [place] [address]    Special Requests   Final    BOTTLES DRAWN AEROBIC AND ANAEROBIC Blood Culture adequate volume Performed at [facility] [address] [place], [place] [address]    Culture   Final    NO GROWTH 5 DAYS Performed at [facility], [place] [address], [place], [place] [address]    Report Status 03/31/2020 FINAL  Final  Culture, blood (routine x 2)     Status: None   Collection Time: 03/26/20  4:27 PM   Specimen: BLOOD  Result Value Ref Range Status   Specimen Description   Final    BLOOD LEFT HAND Performed at [facility] [address] [place], [place] [address]    Special Requests   Final    BOTTLES DRAWN AEROBIC AND ANAEROBIC Blood Culture results may not be optimal due to  an inadequate volume of blood received in culture bottles Performed at [facility], [address] [place], [place] [address]    Culture   Final    NO GROWTH 5 DAYS Performed at [facility], [address], [place], [place] [address]    Report Status 03/31/2020 FINAL  Final  Respiratory Panel by RT PCR (Flu A&B, Covid) - Nasopharyngeal Swab     Status: None   Collection Time: 03/26/20  6:04 PM   Specimen: Nasopharyngeal Swab  Result  Value Ref Range Status   SARS Coronavirus 2 by RT PCR NEGATIVE NEGATIVE Final    Comment: (NOTE) SARS-CoV-2 target nucleic acids are NOT DETECTED. The SARS-CoV-2 RNA is generally detectable in upper respiratoy specimens during the acute phase of infection. The lowest concentration of SARS-CoV-2 viral copies this assay can detect is 131 copies/mL. A negative result does not preclude SARS-Cov-2 infection and should not be used as the sole basis for treatment or other patient management decisions. A negative result may occur with  improper specimen collection/handling, submission of specimen other than nasopharyngeal swab, presence of viral mutation(s) within the areas targeted by this assay, and inadequate number of viral copies (<131 copies/mL). A negative result must be combined with clinical observations, patient history, and epidemiological information. The expected result is Negative. Fact Sheet for Patients:  https://www.moore.com/ Fact Sheet for Healthcare Providers:  https://www.young.biz/ This test is not yet ap proved or cleared by the [place] FDA and  has been authorized for detection and/or diagnosis of SARS-CoV-2 by FDA under an Emergency Use Authorization (EUA). This EUA will remain  in effect (meaning this test can be used) for the duration of the COVID-19 declaration under Section 564(b)(1) of the Act, 21 U.S.C. section 360bbb-3(b)(1), unless the authorization is terminated or revoked sooner.    Influenza A by PCR NEGATIVE NEGATIVE Final   Influenza B by PCR NEGATIVE NEGATIVE Final    Comment: (NOTE) The Xpert Xpress SARS-CoV-2/FLU/RSV assay is intended as an aid in  the diagnosis of influenza from Nasopharyngeal swab specimens and  should not be used as a sole basis for treatment. Nasal washings and  aspirates are unacceptable for Xpert Xpress SARS-CoV-2/FLU/RSV  testing. Fact Sheet for  Patients: https://www.moore.com/ Fact Sheet for Healthcare Providers: https://www.young.biz/ This test is not yet approved or cleared by the [place] FDA and  has been authorized for detection and/or diagnosis of SARS-CoV-2 by  FDA under an Emergency Use Authorization (EUA). This EUA will remain  in effect (meaning this test can be used) for the duration of the  Covid-19 declaration under Section 564(b)(1) of the Act, 21  U.S.C. section 360bbb-3(b)(1), unless the authorization is  terminated or revoked. Performed at [facility], [address], [place], [place] [address]   MRSA PCR Screening     Status: None   Collection Time: 03/27/20 10:02 PM   Specimen: Nasopharyngeal  Result Value Ref Range Status   MRSA by PCR NEGATIVE NEGATIVE Final    Comment:        The GeneXpert MRSA Assay (FDA approved for NASAL specimens only), is one component of a comprehensive MRSA colonization surveillance program. It is not intended to diagnose MRSA infection nor to guide or monitor treatment for MRSA infections. Performed at [facility], [address], [place], [place] [address]      Studies: No results found.   Joycelyn Das, MD  [facility] 04/02/2020

## 2020-04-02 NOTE — Progress Notes (Signed)
Repeat Lamictal level drawn Saturday is still pending.   Ammonia level continues to trend downwards. Level today is mildly elevated at 38, relative to her level of 107 on admission. This trend essentially confirms Depakote as the underlying precipitant of her hyperammonemia.   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  -- Depakote has been discontinued, with improving ammonia levels.  -- Continue Keppra at 1000 mg BID -- Continue Lamictal at 50 mg BID. Will need to continue to monitor Lamictal levels as hepatic metabolism of this medication is likely to increase now that Depakote has been discontinued. If Lamictal level drawn on Saturday is still at the low end of the therapeutic range, then increase Lamictal dosing to 75 mg BID and call Pharmacy for assistance with timing of additional Lamictal levels and possible future dosing changes. When Lamictal level reaches the middle of the therapeutic range, continue at the dose which was being used at the time of the last blood drawl.  -- Neurology will sign off. Please call if there are additional questions.   Electronically signed: Dr. Caryl Pina

## 2020-04-03 LAB — COMPREHENSIVE METABOLIC PANEL
ALT: 88 U/L — ABNORMAL HIGH (ref 0–44)
AST: 41 U/L (ref 15–41)
Albumin: 3 g/dL — ABNORMAL LOW (ref 3.5–5.0)
Alkaline Phosphatase: 53 U/L (ref 38–126)
Anion gap: 10 (ref 5–15)
BUN: 13 mg/dL (ref 6–20)
CO2: 25 mmol/L (ref 22–32)
Calcium: 9.1 mg/dL (ref 8.9–10.3)
Chloride: 101 mmol/L (ref 98–111)
Creatinine, Ser: 0.47 mg/dL (ref 0.44–1.00)
GFR calc Af Amer: >60 mL/min (ref >60)
GFR calc non Af Amer: >60 mL/min (ref >60)
Glucose, Bld: 97 mg/dL (ref 70–99)
Potassium: 5.6 mmol/L — ABNORMAL HIGH (ref 3.5–5.1)
Sodium: 136 mmol/L (ref 135–145)
Total Bilirubin: 0.6 mg/dL (ref 0.3–1.2)
Total Protein: 6.3 g/dL — ABNORMAL LOW (ref 6.5–8.1)

## 2020-04-03 LAB — CBC
HCT: 40.2 % (ref 36.0–46.0)
Hemoglobin: 12.9 g/dL (ref 12.0–15.0)
MCH: 29.1 pg (ref 26.0–34.0)
MCHC: 32.1 g/dL (ref 30.0–36.0)
MCV: 90.5 fL (ref 80.0–100.0)
Platelets: 215 10*3/uL (ref 150–400)
RBC: 4.44 MIL/uL (ref 3.87–5.11)
RDW: 12.6 % (ref 11.5–15.5)
WBC: 6.2 10*3/uL (ref 4.0–10.5)
nRBC: 1.9 % — ABNORMAL HIGH (ref 0.0–0.2)

## 2020-04-03 LAB — BASIC METABOLIC PANEL
Anion gap: 10 (ref 5–15)
Anion gap: 10 (ref 5–15)
BUN: 12 mg/dL (ref 6–20)
BUN: 12 mg/dL (ref 6–20)
CO2: 27 mmol/L (ref 22–32)
CO2: 27 mmol/L (ref 22–32)
Calcium: 9.4 mg/dL (ref 8.9–10.3)
Calcium: 9 mg/dL (ref 8.9–10.3)
Chloride: 101 mmol/L (ref 98–111)
Chloride: 100 mmol/L (ref 98–111)
Creatinine, Ser: 0.5 mg/dL (ref 0.44–1.00)
Creatinine, Ser: 0.52 mg/dL (ref 0.44–1.00)
GFR calc Af Amer: >60 mL/min (ref >60)
GFR calc Af Amer: >60 mL/min (ref >60)
GFR calc non Af Amer: >60 mL/min (ref >60)
GFR calc non Af Amer: >60 mL/min (ref >60)
Glucose, Bld: 92 mg/dL (ref 70–99)
Glucose, Bld: 112 mg/dL — ABNORMAL HIGH (ref 70–99)
Potassium: 4.8 mmol/L (ref 3.5–5.1)
Potassium: 4.3 mmol/L (ref 3.5–5.1)
Sodium: 138 mmol/L (ref 135–145)
Sodium: 137 mmol/L (ref 135–145)

## 2020-04-03 LAB — MAGNESIUM: Magnesium: 1.9 mg/dL (ref 1.7–2.4)

## 2020-04-03 LAB — PROTIME-INR
INR: 1 (ref 0.8–1.2)
Prothrombin Time: 13.2 seconds (ref 11.4–15.2)

## 2020-04-03 LAB — AMMONIA: Ammonia: 49 umol/L — ABNORMAL HIGH (ref 9–35)

## 2020-04-03 LAB — LAMOTRIGINE LEVEL: Lamotrigine Lvl: 3.2 ug/mL (ref 2.0–20.0)

## 2020-04-03 NOTE — Progress Notes (Signed)
Pharmacy Antibiotic Note  Alyssa Wong is a 29 y.o. female admitted on 03/26/2020 with VP shunt infection.  Pharmacy has been consulted for Vancomycin dosing.   Vancomycin trough < 4 (5/7) with prior doses of vancomycin 1gm iv q24hr charted correctly.  04/03/20 1:13 PM - SCr 0.37 - WBC 6.5, afebrile - all cultures remain negative    Plan: Continue vancomycin 750 mg iv q 12 hours for now - Discussed duration of therapy for antibiotics with Dr. Tyson Babinski- plan was initially 10 days total abx agreed upon with neuro, will see if we can stop sooner - no further vancomycin levels   Height: 5\' 2"  (157.5 cm) Weight: 50.1 kg (110 lb 8 oz) IBW/kg (Calculated) : 50.1  Temp (24hrs), Avg:98.2 F (36.8 C), Min:97.5 F (36.4 C), Max:98.7 F (37.1 C)  Recent Labs  Lab 03/30/20 0230 03/30/20 0230 03/31/20 0302 03/31/20 2136 04/01/20 0230 04/02/20 0333 04/03/20 0442 04/03/20 0753  WBC 9.0  --  6.5  --  6.5 5.9 6.2  --   CREATININE 0.47   < > 0.47  --  0.37* 0.51 0.47 0.50  LATICACIDVEN  --   --   --   --  1.8  --   --   --   VANCOTROUGH  --   --   --  <4*  --   --   --   --    < > = values in this interval not displayed.    Estimated Creatinine Clearance: 81.3 mL/min (by C-G formula based on SCr of 0.5 mg/dL).    No Known Allergies  No Known Allergies Antimicrobials this admission: 5/2 vanc >>  5/2 cefepime >> 5/6 5/6 ceftriaxone >>  Dose adjustments this admission: Vancomycin 1gm iv q24hr --> Vt = < 4.  Prior doses charted. Change to 750mg  iv q12hr, after 1gm dose charted 5/7 PM.   Microbiology results:  5/2 BCx: NGF 5/2 UCx: NGF 5/2: COVID/influenza PCR: negative 5/3 MRSA neg 5/2 HIV NR Thank you for allowing pharmacy to be a part of this patient's care.  7/7 D 04/03/2020 1:12 PM

## 2020-04-03 NOTE — Progress Notes (Signed)
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 Patient is nonverbal and was profoundly more lethargic than her baseline.  There was some mention of prominence of the left eye with redness.  Patient was then admitted to the hospital for lethargy, metabolic encephalopathy.  Assessment/Plan:  Active Problems:   S/P VP shunt   Crouzon syndrome   SIRS (systemic inflammatory response syndrome) (HCC)   Altered mental status   Hypertensive urgency   Status post insertion of percutaneous endoscopic gastrostomy (PEG) tube (HCC)   Hyperammonemia (HCC)   Lactic acidosis   Metabolic encephalopathy  Metabolic encephalopathy.  Improving.  Likely secondary to hyperammonemia from Depakote.  Could not rule out shunt infection so empirically on vancomycin and Rocephin.  Plan to complete a 10-day course as per previous discussion with neurology.  EEG with encephalopathy.  Unable to do MRI.  Clinically improving.  Covid negative.  Blood cultures negative.  Ammonia level improved at 49.  Continue Lamictal  Breakthrough seizures on 03/30/2020.  Lamictal level was low and neurology has adjusted the doses.  Continue Keppra.  Will need to monitor Lamictal dose from 04/01/2020 to adjust the doses of Lamictal.  Left eye conjunctivitis.  Continue antibiotic drops. Improving.  History of seizures.  Off Depakote at this time.  On Lamictal and Keppra.  Neurology on board. Lamictal dose has been adjusted.  Check Lamictal dose from 04/01/2020  Elevated LFTs with hyperammonemia likely  secondary to Depakote.   Closely monitor.  LFTs trending down, has significantly improved. Off Depakote at this time.  Continue lactulose for BM 2-3 times a day.   Crouzon syndrome status post VP shunt.  Status post PEG tube placement.  Elevated BP. On presentation. Not on any antihypertensives at home. On amlodipine, labetalol.  Better today.  Lactic acidosis.  Resolved.  Nutrition.  PEG tube with bolus feeds.  Nutritionist on board.  Tolerating bolus feeding.  Hypophosphatemia.  Improved after replacement.  Phosphate of 5.6.  Check BMP in p.m.  VTE Prophylaxis: Lovenox subcu  Code Status: Full code  Family Communication: None today. I spoke with the patient's mother Ms. Alyssa Wong on the phone yesterday.  Status is: Inpatient  The patient will require  inpatient because: Altered mental status, Unsafe d/c plan, IV treatments appropriate due to intensity of illness or inability to take PO and further neurological follow-up, IV antibiotics.  Dispo: The patient is from: Home              Anticipated d/c is to: Home              Anticipated d/c date is: 2-3 days              Patient currently is not medically stable to d/c.  Consultants:  Neurology  Procedures:  EEG  Antibiotics:  . Rocephin, Vancomycin IV  Anti-infectives (From admission, onward)   Start     Dose/Rate Route Frequency Ordered Stop   04/01/20 1000  vancomycin (VANCOREADY) IVPB 750 mg/150 mL     750 mg 150 mL/hr over 60 Minutes Intravenous Every  12 hours 03/31/20 2324     03/30/20 1400  cefTRIAXone (ROCEPHIN) 2 g in sodium chloride 0.9 % 100 mL IVPB     2 g 200 mL/hr over 30 Minutes Intravenous Every 12 hours 03/30/20 1118     03/26/20 2200  ceFEPIme (MAXIPIME) 2 g in sodium chloride 0.9 % 100 mL IVPB  Status:  Discontinued     2 g 200 mL/hr over 30 Minutes Intravenous Every 8 hours 03/26/20 2055 03/30/20 1118   03/26/20 2200  vancomycin (VANCOCIN) IVPB 1000 mg/200 mL premix  Status:  Discontinued     1,000 mg  200 mL/hr over 60 Minutes Intravenous Every 24 hours 03/26/20 2101 03/31/20 2323     Subjective: Today, patient was seen and examined at bedside.  Appears to be a little more responsive and opens eyes on verbal command.    Objective: Vitals:   04/03/20 0500 04/03/20 0717  BP: 107/88   Pulse: (!) 101 (!) 101  Resp: 20   Temp: 98.7 F (37.1 C)   SpO2: (!) 63% 100%    Intake/Output Summary (Last 24 hours) at 04/03/2020 1332 Last data filed at 04/03/2020 0100 Gross per 24 hour  Intake 1238.43 ml  Output 350 ml  Net 888.43 ml   Filed Weights   04/01/20 0500 04/02/20 0440 04/03/20 0500  Weight: 50.5 kg 47 kg 50.1 kg   Body mass index is 20.21 kg/m.   Physical Exam:  General: Nonverbal, not in obvious distress, more responsive on verbal command HENT:   No scleral pallor or icterus noted. Oral mucosa is moist.  Bilateral proptosis with conjunctival injection on the left side. Chest:  Clear breath sounds.  Diminished breath sounds bilaterally. No crackles or wheezes.  CVS: S1 &S2 heard. No murmur.  Regular rate and rhythm. Abdomen: Soft, nontender, nondistended.  Bowel sounds are heard.  Place. Extremities: No cyanosis, clubbing or edema.  Peripheral pulses are palpable. Psych: More alert awake and responsive nonverbal, no neck stiffness. CNS: Moving extremities, nonverbal. Skin: Warm and dry.  No rashes noted.  Data Review: I have personally reviewed the following laboratory data and studies,  CBC: Recent Labs  Lab 03/30/20 0230 03/31/20 0302 04/01/20 0230 04/02/20 0333 04/03/20 0442  WBC 9.0 6.5 6.5 5.9 6.2  HGB 12.5 13.0 13.0 13.6 12.9  HCT 39.2 41.1 41.5 43.1 40.2  MCV 89.5 90.7 92.2 90.7 90.5  PLT 179 210 200 230 215   Basic Metabolic Panel: Recent Labs  Lab 03/28/20 0238 03/28/20 0238 03/29/20 0221 03/29/20 0221 03/30/20 0230 03/30/20 0230 03/31/20 0302 04/01/20 0230 04/02/20 0333 04/03/20 0442 04/03/20 0753  NA 129*   < > 137   < > 135   < > 135  134* 137 136 138  K 4.1   < > 3.5   < > 3.8   < > 4.1 4.5 5.0 5.6* 4.8  CL 96*   < > 103   < > 102   < > 101 99 100 101 101  CO2 25   < > 26   < > 24   < > 24 26 27 25 27   GLUCOSE 132*   < > 155*   < > 117*   < > 159* 138* 102* 97 92  BUN 10   < > 11   < > 9   < > 10 9 10 13 12   CREATININE 0.46   < > 0.43*   < > 0.47   < > 0.47 0.37* 0.51  0.47 0.50  CALCIUM 7.9*   < > 7.9*   < > 8.0*   < > 8.3* 8.4* 9.4 9.1 9.4  MG 2.0   < > 2.3   < > 2.0  --  2.0 2.0 2.1 1.9  --   PHOS 2.7  --  2.3*  --  3.0  --   --  3.5 4.3  --   --    < > = values in this interval not displayed.   Liver Function Tests: Recent Labs  Lab 03/29/20 0221 03/31/20 0302 04/01/20 0230 04/02/20 0333 04/03/20 0442  AST 124* 52* 44* 48* 41  ALT 334* 187* 131* 126* 88*  ALKPHOS 41 50 47 50 53  BILITOT 0.5 0.3 0.6 0.4 0.6  PROT 6.2* 6.7 6.4* 7.1 6.3*  ALBUMIN 3.0* 3.2* 2.9* 3.4* 3.0*   No results for input(s): LIPASE, AMYLASE in the last 168 hours. Recent Labs  Lab 03/28/20 0904 03/29/20 0950 03/31/20 0302 04/02/20 0618 04/03/20 0442  AMMONIA 70* 43* 56* 38* 49*   Cardiac Enzymes: No results for input(s): CKTOTAL, CKMB, CKMBINDEX, TROPONINI in the last 168 hours. BNP (last 3 results) No results for input(s): BNP in the last 8760 hours.  ProBNP (last 3 results) No results for input(s): PROBNP in the last 8760 hours.  CBG: Recent Labs  Lab 03/31/20 0001  GLUCAP 155*   Recent Results (from the past 240 hour(s))  Urine culture     Status: None   Collection Time: 03/26/20  4:09 PM   Specimen: Urine, Clean Catch  Result Value Ref Range Status   Specimen Description   Final    URINE, CLEAN CATCH Performed at [facility], [place] [address] [place], [place] [address]    Special Requests   Final    NONE Performed at [facility], [place] [address], [place], [place] [address]    Culture   Final    NO GROWTH Performed at [facility], [place] [address]  [place], [place] [address]    Report Status 03/27/2020 FINAL  Final  Culture, blood (routine x 2)     Status: None   Collection Time: 03/26/20  4:27 PM   Specimen: BLOOD  Result Value Ref Range Status   Specimen Description   Final    BLOOD RIGHT HAND Performed at [place] [address], [place], [place] [address]    Special Requests   Final    BOTTLES DRAWN AEROBIC AND ANAEROBIC Blood Culture adequate volume Performed at [place] [address] [place], [place] [address]    Culture   Final    NO GROWTH 5 DAYS Performed at [facility], [place] [address], [place], [place] [address]    Report Status 03/31/2020 FINAL  Final  Culture, blood (routine x 2)     Status: None   Collection Time: 03/26/20  4:27 PM   Specimen: BLOOD  Result Value Ref Range Status   Specimen Description   Final    BLOOD LEFT HAND Performed at [place] [address] [place], [place] [address]    Special Requests   Final    BOTTLES DRAWN AEROBIC AND ANAEROBIC Blood Culture results may not be optimal due to an inadequate volume of blood received in culture bottles Performed at [place] [address], [place],  [address]    Culture   Final    NO GROWTH 5  DAYS Performed at [facility], [address], [place], [place] [address]    Report Status 03/31/2020 FINAL  Final  Respiratory Panel by RT PCR (Flu A&B, Covid) - Nasopharyngeal Swab     Status: None   Collection Time: 03/26/20  6:04 PM   Specimen: Nasopharyngeal Swab  Result Value Ref Range Status   SARS Coronavirus 2 by RT PCR NEGATIVE NEGATIVE Final    Comment: (NOTE) SARS-CoV-2 target nucleic acids are NOT DETECTED. The SARS-CoV-2 RNA is generally detectable in upper respiratoy specimens during the acute phase of infection. The lowest concentration of SARS-CoV-2 viral copies this assay can detect is 131 copies/mL. A negative result does  not preclude SARS-Cov-2 infection and should not be used as the sole basis for treatment or other patient management decisions. A negative result may occur with  improper specimen collection/handling, submission of specimen other than nasopharyngeal swab, presence of viral mutation(s) within the areas targeted by this assay, and inadequate number of viral copies (<131 copies/mL). A negative result must be combined with clinical observations, patient history, and epidemiological information. The expected result is Negative. Fact Sheet for Patients:  https://www.moore.com/ Fact Sheet for Healthcare Providers:  https://www.young.biz/ This test is not yet ap proved or cleared by the [place] FDA and  has been authorized for detection and/or diagnosis of SARS-CoV-2 by FDA under an Emergency Use Authorization (EUA). This EUA will remain  in effect (meaning this test can be used) for the duration of the COVID-19 declaration under Section 564(b)(1) of the Act, 21 U.S.C. section 360bbb-3(b)(1), unless the authorization is terminated or revoked sooner.    Influenza A by PCR NEGATIVE NEGATIVE Final   Influenza B by PCR NEGATIVE NEGATIVE Final    Comment: (NOTE) The Xpert Xpress SARS-CoV-2/FLU/RSV assay is intended as an aid in  the diagnosis of influenza from Nasopharyngeal swab specimens and  should not be used as a sole basis for treatment. Nasal washings and  aspirates are unacceptable for Xpert Xpress SARS-CoV-2/FLU/RSV  testing. Fact Sheet for Patients: https://www.moore.com/ Fact Sheet for Healthcare Providers: https://www.young.biz/ This test is not yet approved or cleared by the [place] FDA and  has been authorized for detection and/or diagnosis of SARS-CoV-2 by  FDA under an Emergency Use Authorization (EUA). This EUA will remain  in effect (meaning this test can be used) for the duration of the   Covid-19 declaration under Section 564(b)(1) of the Act, 21  U.S.C. section 360bbb-3(b)(1), unless the authorization is  terminated or revoked. Performed at [facility], [address], [place], [place] [address]   MRSA PCR Screening     Status: None   Collection Time: 03/27/20 10:02 PM   Specimen: Nasopharyngeal  Result Value Ref Range Status   MRSA by PCR NEGATIVE NEGATIVE Final    Comment:        The GeneXpert MRSA Assay (FDA approved for NASAL specimens only), is one component of a comprehensive MRSA colonization surveillance program. It is not intended to diagnose MRSA infection nor to guide or monitor treatment for MRSA infections. Performed at [facility], [address] [place], [place] [address]      Studies: No results found.   Joycelyn Das, MD  [facility] 04/03/2020

## 2020-04-04 LAB — CREATININE, SERUM
Creatinine, Ser: 0.47 mg/dL (ref 0.44–1.00)
GFR calc Af Amer: >60 mL/min (ref >60)
GFR calc non Af Amer: >60 mL/min (ref >60)

## 2020-04-04 MED ORDER — LAMOTRIGINE 25 MG PO TABS
75.0000 mg | ORAL_TABLET | Freq: Two times a day (BID) | ORAL | Status: DC
  Administered 2020-04-04: 75 mg via ORAL
  Filled 2020-04-04: qty 3

## 2020-04-04 MED ORDER — LABETALOL HCL 100 MG PO TABS: 100.0000 mg | ORAL_TABLET | Freq: Two times a day (BID) | ORAL | 1 refills | Status: DC

## 2020-04-04 MED ORDER — LEVETIRACETAM 100 MG/ML PO SOLN: 1000.0000 mg | Freq: Two times a day (BID) | ORAL | 12 refills | Status: DC

## 2020-04-04 MED ORDER — LAMOTRIGINE 25 MG PO CHEW: 75.0000 mg | CHEWABLE_TABLET | Freq: Two times a day (BID) | ORAL | 2 refills | Status: DC

## 2020-04-04 NOTE — Progress Notes (Signed)
CSW received a call from pt's RN who states she is under the impression that the pt's transport has been called due to pt's D/C packet being on the unit, but was unsure and is requesting clarification.    Pt's RN states there were concerns earlier today that since pt is 'mute' and thus unable to call family that:  1. Pt's family may be unaware that pt is D/C'ing.  2.  Pt had two addresses listed at one time and it not being known which is the correct address.  CSW called [facility] Dispatcher's direct line and was told [facility] has NOT been called for the pt.  CSW called pt's mother Lowella Petties at ph: [phone] to seek to update pt's mother that pt is ready for D/C, to document pt's mother would be at home to receive the pt and also to seek pt's correct address.  CSW left a HIPPA-compliant VM requesting a return call.  CSW reviewed chart and saw no indication that this information has been confirmed.  CSW will continue to follow for D/C needs.  Dorothe Pea. Welborn Keena  MSW, LCSW, LCAS, CSI Transitions of Care Clinical Social Worker Care Coordination Department Ph: [phone]

## 2020-04-04 NOTE — Progress Notes (Signed)
RN contacted by case management stating [employer] was able to locate pt's mom and arrange for discharge. Mom stated she will pick pt up at discharge and has a wheelchair for her at home.

## 2020-04-04 NOTE — Progress Notes (Addendum)
CSW received a call from pt's mother who stated today was the pt's mother's birthday, that she was not sure that pt was D/C'ing today and that she would be en route shortly to p/u the pt.  Pt's mother stated that she would bring pt some clothes, go to pt's room and take pt with her after pt is D/C'd.  CSW offered to utilize [facility] for safety and pt's mother stated, "Why, I have her wheelchair" and CSW replied, "When appropriate this is usually how we transport pt for safety".  Pt's mother stated, "No, I will come pick her up and then apologized for not realizing pt was definitely D/C'ing today.  CSW stated no apology was necessary and stated CSW would be standing by should pt's mother need to call.  Pt's mother was appreciative and thanked the CSW.  RN updated.  CSW will continue to follow for D/C needs.  Dorothe Pea. Iran Kievit  MSW, LCSW, LCAS, CSI Transitions of Care Clinical Social Worker Care Coordination Department Ph: [phone]

## 2020-04-04 NOTE — Progress Notes (Signed)
Nutrition Follow-up  INTERVENTION:   -Continue Osmolite 1.2, 356 ml (1.5 cartons) QID via PEG -Continue 60 ml water flush before and after feedings. -Free water flushes 120 ml QID -This regimen will provide 1710 kcal, 79 grams of protein, and 2130 ml free water  NUTRITION DIAGNOSIS:   Inadequate oral intake related to inability to eat as evidenced by NPO status.  Ongoing.  GOAL:   Patient will meet greater than or equal to 90% of their needs  Meeting with TF.  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   Patient admitted on 5/3 for lethargy metabolic encephalopathy.  Patient continues to receive bolus feeds of Osmolite 1.2, 356 ml QID. Pt having loose stools but has been receiving IV antibiotics and lactulose as well. Otherwise tolerating.  Admission weight: 104 lbs. Current weight: 108 lbs. I/Os: +10.9L since admit UOP: 1.4L x 24 hrs  Labs reviewed. Medications: Lactulose solution, D5 infusion, IV Vancomycin  Diet Order:   Diet Order            Diet NPO time specified Except for: Other (See Comments)  Diet effective now              EDUCATION NEEDS:   No education needs have been identified at this time  Skin:  Skin Assessment: Reviewed RN Assessment  Last BM:  5/4 type 4  Height:   Ht Readings from Last 1 Encounters:  03/29/20 5\' 2"  (1.575 m)    Weight:   Wt Readings from Last 1 Encounters:  04/04/20 49.4 kg    Ideal Body Weight:     BMI:  Body mass index is 19.92 kg/m.  Estimated Nutritional Needs:   Kcal:  1550-1700  Protein:   70-85  Fluid:  >/= 1.5 L/day  06/04/20, MS, RD, LDN Inpatient Clinical Dietitian Contact information available via Amion

## 2020-04-04 NOTE — Discharge Summary (Signed)
Physician Discharge Summary  Alyssa Wong GMW:[id number] DOB: 1991/01/31 DOA: 03/26/2020  PCP: [name], MD  Admit date: 03/26/2020 Discharge date: 04/04/2020  Admitted From: Home  Discharge disposition: Home   Recommendations for Outpatient Follow-Up:   . Follow up with your primary care provider in one week.  . Check CBC, BMP in the next visit . Please recheck lamotrigine, Keppra level in the next visit. . Do not use Depakote due to hepatotoxicity and hyperammonemia. . Follow up with your neurology provider in 1 to 2 weeks.  Discharge Diagnosis:   Active Problems:   S/P VP shunt   Crouzon syndrome   SIRS (systemic inflammatory response syndrome) (HCC)   Altered mental status   Hypertensive urgency   Status post insertion of percutaneous endoscopic gastrostomy (PEG) tube (HCC)   Hyperammonemia (HCC)   Lactic acidosis   Metabolic encephalopathy   Discharge Condition: Improved.  Diet recommendation: Tube feeds  Wound care: None.  Code status: Full.   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 Patient is nonverbal and was profoundly more lethargic than her baseline.  There was some mention of prominence of the left eye with redness.  Patient was then admitted to the hospital for lethargy, metabolic encephalopathy.   Hospital Course:   Following conditions were addressed during hospitalization as listed below,  Metabolic encephalopathy.  Improving.  At baseline at this time. Likely secondary to hyperammonemia from Depakote.  Could not rule out shunt infection so empirically received a 10-day course of IV vancomycin and Rocephin as per  neurology recommendation.. EEG with encephalopathy.  Unable to do MRI due to difficulty discerning the shunt material..  Clinically improved. Covid negative.  Blood cultures negative.  Ammonia level improved at 49.    LFTs significantly improved after stopping Depakote.  Continue Lamictal on discharge.  Breakthrough seizures on 03/30/2020.  History of seizure disorder on Depakote and Lamictal at home. Lamictal level was low and neurology has adjusted the doses.  Continue Keppra instead of Depakote due to hepatotoxicity and hyperammonemia.  On 1000 mg twice daily..  Lamictal dose has been increased to 75 mg twice daily.  Will need to follow-up with Keppra and Lamictal level as outpatient.  Left eye conjunctivitis.    Improved with antibiotic drops.  Elevated LFTs with hyperammonemia likely secondary to Depakote.   Closely monitor.  LFTs trending down, has significantly improved. Off Depakote at this time.  Continue lactulose for BM 2-3 times a day.   Crouzon syndrome status post VP shunt.  Status post PEG tube placement.  Hypertension..  Received amlodipine, labetalol during hospitalization.  We will continue with labetalol discharge.  Lactic acidosis.  Resolved.  Nutrition.  PEG tube with bolus feeds.  Continue on discharge  Hypophosphatemia.  Improved after replacement.   Disposition.  At this time, patient is stable for disposition.  Spoke with the patient's mother on the phone and updated her about the clinical condition of the patient and the plan for disposition and follow-up.  Medical Consultants:    Neurology  Procedures:    EEG Subjective:   Today, patient seems to be at baseline.  More alert awake.  No interval complaints reported  Discharge  Exam:   Vitals:   04/04/20 0622 04/04/20 1300  BP: 119/74 117/79  Pulse: 94 70  Resp: 18 17  Temp: (!) 97.5 F (36.4 C) 97.8 F (36.6 C)  SpO2: 100% 98%   Vitals:   04/03/20 2107 04/04/20 0500 04/04/20 0622 04/04/20  1300  BP: (!) 124/92  119/74 117/79  Pulse: (!) 109  94 70  Resp: 18  18 17   Temp: 98 F (36.7 C)  (!) 97.5 F (36.4 C) 97.8 F (36.6 C)  TempSrc: Oral  Oral Oral  SpO2: 100%  100% 98%  Weight:  49.4 kg    Height:       General: Alert awake, nonverbal not in obvious distress HENT: pupils equally reacting to light,  No scleral pallor or icterus noted. Oral mucosa is moist.  Bilateral proptosis. Chest:  Clear breath sounds.  Diminished breath sounds bilaterally. No crackles or wheezes.  CVS: S1 &S2 heard. No murmur.  Regular rate and rhythm. Abdomen: Soft, nontender, nondistended.  Bowel sounds are heard.  PEG tube in place. Extremities: No cyanosis, clubbing or edema.  Peripheral pulses are palpable. Psych: Alert, awake, responding to verbal command.  Nonverbal, normal mood CNS: Alert awake responding to verbal stimuli, nonverbal at baseline.  Moving extremities. Skin: Warm and dry.  No rashes noted.  The results of significant diagnostics from this hospitalization (including imaging, microbiology, ancillary and laboratory) are listed below for reference.     Diagnostic Studies:   EEG  Result Date: 03/28/2020 05/28/2020, MD     03/28/2020  1:31 PM Patient Name: Alyssa Wong MRN: [facility] Attending: [id number] Referring Physician/Provider: Dr. Charlsie Quest Date: 03/28/2020 Duration: 26.03 minutes Patient history: 29 year old female with history of cerebral palsy and seizures who presented with increased lethargy in the setting of hyperammonemia on Depakote.  EEG to evaluate for seizures. Level of alertness: Awake AEDs during EEG study: Keppra, lamotrigine Technical aspects: This EEG study was done with scalp electrodes positioned according to the 10-20 International system of electrode placement. Electrical activity was acquired at a sampling rate of 500Hz  and reviewed with a high frequency filter of 70Hz  and a low frequency filter of 1Hz . EEG data were recorded  continuously and digitally stored. Description: No clear posterior dominant rhythm was seen.  EEG showed continuous generalized sharply contoured 3 to 5 Hz theta-delta slowing in right hemisphere, maximal right temporoparietal region.  Sharp waves, at times periodic at 1 Hz were also noted in the right temporoparietal region.  There was also 6 to 9 Hz theta-alpha activity in left hemisphere.  Hyperventilation and photic stimulation were not performed. Abnormality -Sharp waves, right temporoparietal region -Continuous slow, generalized and lateralized right hemisphere, maximal right temporoparietal region IMPRESSION: This study showed evidence of epileptogenicity as well as cortical dysfunction in the right temporoparietal region consistent with underlying craniotomy.  Additionally, there is evidence of mild to moderate diffuse encephalopathy, nonspecific etiology but likely related to patient's history of cerebral palsy and intellectual delay. No seizures were seen throughout the recording. Priyanka 26     Labs:   Basic Metabolic Panel: Recent Labs  Lab 03/29/20 0221 03/29/20 0221 03/30/20 0230 03/30/20 0230 03/31/20 0302 03/31/20 0302 04/01/20 0230 04/01/20 0230 04/02/20 05/31/20 04/02/20 06/01/20 04/03/20 0442 04/03/20 0442 04/03/20 0753 04/03/20 1811 04/04/20 0604  NA 137   < > 135   < > 135   < > 134*  --  137  --  136  --  138 137  --  K 3.5   < > 3.8   < > 4.1   < > 4.5   < > 5.0   < > 5.6*   < > 4.8 4.3  --   CL 103   < > 102   < > 101   < > 99  --  100  --  101  --  101 100  --   CO2 26   < > 24   < > 24   < > 26  --  27  --  25  --  27 27  --   GLUCOSE 155*   < > 117*   < > 159*   < > 138*  --  102*  --  97  --  92 112*  --   BUN 11   < > 9   < > 10   < > 9  --  10  --  13  --  12 12  --   CREATININE 0.43*   < > 0.47   < > 0.47   < > 0.37*   < > 0.51  --  0.47  --  0.50 0.52 0.47  CALCIUM 7.9*   < > 8.0*   < > 8.3*   < > 8.4*  --  9.4  --  9.1  --  9.4 9.0  --   MG 2.3   < >  2.0  --  2.0  --  2.0  --  2.1  --  1.9  --   --   --   --   PHOS 2.3*  --  3.0  --   --   --  3.5  --  4.3  --   --   --   --   --   --    < > = values in this interval not displayed.   GFR Estimated Creatinine Clearance: 80.2 mL/min (by C-G formula based on SCr of 0.47 mg/dL). Liver Function Tests: Recent Labs  Lab 03/29/20 0221 03/31/20 0302 04/01/20 0230 04/02/20 0333 04/03/20 0442  AST 124* 52* 44* 48* 41  ALT 334* 187* 131* 126* 88*  ALKPHOS 41 50 47 50 53  BILITOT 0.5 0.3 0.6 0.4 0.6  PROT 6.2* 6.7 6.4* 7.1 6.3*  ALBUMIN 3.0* 3.2* 2.9* 3.4* 3.0*   No results for input(s): LIPASE, AMYLASE in the last 168 hours. Recent Labs  Lab 03/29/20 0950 03/31/20 0302 04/02/20 0618 04/03/20 0442  AMMONIA 43* 56* 38* 49*   Coagulation profile Recent Labs  Lab 04/03/20 0442  INR 1.0    CBC: Recent Labs  Lab 03/30/20 0230 03/31/20 0302 04/01/20 0230 04/02/20 0333 04/03/20 0442  WBC 9.0 6.5 6.5 5.9 6.2  HGB 12.5 13.0 13.0 13.6 12.9  HCT 39.2 41.1 41.5 43.1 40.2  MCV 89.5 90.7 92.2 90.7 90.5  PLT 179 210 200 230 215   Cardiac Enzymes: No results for input(s): CKTOTAL, CKMB, CKMBINDEX, TROPONINI in the last 168 hours. BNP: Invalid input(s): POCBNP CBG: Recent Labs  Lab 03/31/20 0001  GLUCAP 155*   D-Dimer No results for input(s): DDIMER in the last 72 hours. Hgb A1c No results for input(s): HGBA1C in the last 72 hours. Lipid Profile No results for input(s): CHOL, HDL, LDLCALC, TRIG, CHOLHDL, LDLDIRECT in the last 72 hours. Thyroid function studies No results for input(s): TSH, T4TOTAL, T3FREE, THYROIDAB in the last 72 hours.  Invalid input(s): FREET3 Anemia work up No results for input(s): VITAMINB12,  FOLATE, FERRITIN, TIBC, IRON, RETICCTPCT in the last 72 hours. Microbiology Recent Results (from the past 240 hour(s))  Urine culture     Status: None   Collection Time: 03/26/20  4:09 PM   Specimen: Urine, Clean Catch  Result Value Ref Range Status    Specimen Description   Final    URINE, CLEAN CATCH Performed at [facility], [place] [address], [place], [place] [address]    Special Requests   Final    NONE Performed at [facility], [place] [address], [place], [place] [address]    Culture   Final    NO GROWTH Performed at [facility], [place] [address] [place], [place] [address]    Report Status 03/27/2020 FINAL  Final  Culture, blood (routine x 2)     Status: None   Collection Time: 03/26/20  4:27 PM   Specimen: BLOOD  Result Value Ref Range Status   Specimen Description   Final    BLOOD RIGHT HAND Performed at [place] [address] [place], [place] [address]    Special Requests   Final    BOTTLES DRAWN AEROBIC AND ANAEROBIC Blood Culture adequate volume Performed at [place] [address], [place], [place] [address]    Culture   Final    NO GROWTH 5 DAYS Performed at [facility], [place] [employer] [address], [place], [place] [address]    Report Status 03/31/2020 FINAL  Final  Culture, blood (routine x 2)     Status: None   Collection Time: 03/26/20  4:27 PM   Specimen: BLOOD  Result Value Ref Range Status   Specimen Description   Final    BLOOD LEFT HAND Performed at [place] [address], [place], [place] [address]    Special Requests   Final    BOTTLES DRAWN AEROBIC AND ANAEROBIC Blood Culture results may not be optimal due to an inadequate volume of blood received in culture bottles Performed at [place] [address] [place], [place] [address]    Culture   Final    NO GROWTH 5 DAYS Performed at [facility], [place] [address] [place],  [address]    Report Status 03/31/2020 FINAL  Final  Respiratory Panel by RT PCR (Flu A&B, Covid) - Nasopharyngeal Swab     Status: None   Collection Time: 03/26/20  6:04 PM   Specimen: Nasopharyngeal Swab  Result Value Ref Range  Status   SARS Coronavirus 2 by RT PCR NEGATIVE NEGATIVE Final    Comment: (NOTE) SARS-CoV-2 target nucleic acids are NOT DETECTED. The SARS-CoV-2 RNA is generally detectable in upper respiratoy specimens during the acute phase of infection. The lowest concentration of SARS-CoV-2 viral copies this assay can detect is 131 copies/mL. A negative result does not preclude SARS-Cov-2 infection and should not be used as the sole basis for treatment or other patient management decisions. A negative result may occur with  improper specimen collection/handling, submission of specimen other than nasopharyngeal swab, presence of viral mutation(s) within the areas targeted by this assay, and inadequate number of viral copies (<131 copies/mL). A negative result must be combined with clinical observations, patient history, and epidemiological information. The expected result is Negative. Fact Sheet for Patients:  PinkCheek.be Fact Sheet for Healthcare Providers:  GravelBags.it This test is not yet ap proved or cleared by the [place] and  has been authorized for  detection and/or diagnosis of SARS-CoV-2 by FDA under an Emergency Use Authorization (EUA). This EUA will remain  in effect (meaning this test can be used) for the duration of the COVID-19 declaration under Section 564(b)(1) of the Act, 21 U.S.C. section 360bbb-3(b)(1), unless the authorization is terminated or revoked sooner.    Influenza A by PCR NEGATIVE NEGATIVE Final   Influenza B by PCR NEGATIVE NEGATIVE Final    Comment: (NOTE) The Xpert Xpress SARS-CoV-2/FLU/RSV assay is intended as an aid in  the diagnosis of influenza from Nasopharyngeal swab specimens and  should not be used as a sole basis for treatment. Nasal washings and  aspirates are unacceptable for Xpert Xpress SARS-CoV-2/FLU/RSV  testing. Fact Sheet for  Patients: https://www.moore.com/ Fact Sheet for Healthcare Providers: https://www.young.biz/ This test is not yet approved or cleared by the [place] FDA and  has been authorized for detection and/or diagnosis of SARS-CoV-2 by  FDA under an Emergency Use Authorization (EUA). This EUA will remain  in effect (meaning this test can be used) for the duration of the  Covid-19 declaration under Section 564(b)(1) of the Act, 21  U.S.C. section 360bbb-3(b)(1), unless the authorization is  terminated or revoked. Performed at [facility], [address], [place], [place] [address]   MRSA PCR Screening     Status: None   Collection Time: 03/27/20 10:02 PM   Specimen: Nasopharyngeal  Result Value Ref Range Status   MRSA by PCR NEGATIVE NEGATIVE Final    Comment:        The GeneXpert MRSA Assay (FDA approved for NASAL specimens only), is one component of a comprehensive MRSA colonization surveillance program. It is not intended to diagnose MRSA infection nor to guide or monitor treatment for MRSA infections. Performed at [facility], [address], [place], [place] [address]      Discharge Instructions:   Discharge Instructions    Discharge instructions   Complete by: As directed    Follow-up with your primary care physician in 1 week.  Follow-up with neurology in 1 to 2 weeks.  You will have to check blood work at that time.  Seek medical attention for worsening symptoms.  Continue tube feeding at home.     Allergies as of 04/04/2020   No Known Allergies     Medication List    TAKE these medications   glycopyrrolate 2 MG tablet Commonly known as: ROBINUL TAKE 1/2 TO 1 TABLET BY MOUTH THREE TIMES DAILY AS NEEDED FOR DROOLING What changed:   how much to take  how to take this  when to take this  additional instructions   labetalol 100 MG tablet Commonly known as: NORMODYNE Take 1  tablet (100 mg total) by mouth 2 (two) times daily.   lactulose 10 GM/15ML solution Commonly known as: CHRONULAC Take 3.4 g by mouth 2 (two) times daily as needed for mild constipation or moderate constipation. Take 39mL by mouth twice daily as needed for constipation   lamoTRIgine 25 MG Chew chewable tablet Commonly known as: LAMICTAL Chew 3 tablets (75 mg total) by mouth 2 (two) times daily. CHEW AND SWALLOW if possible What changed:   how much to take  how to take this  when to take this  additional instructions   levETIRAcetam 100 MG/ML solution Commonly known as: KEPPRA Place 10 mLs (1,000 mg total) into feeding tube 2 (two) times daily.   levonorgestrel-ethinyl estradiol 0.15-0.03 MG tablet Commonly known as: SEASONALE Take 1 tablet by  mouth daily.   OPCON-A OP Apply 1-2 drops to eye every 6 (six) hours as needed (eye redness).   valproic acid 250 MG/5ML solution Commonly known as: DEPAKENE TAKE 5 ML BY MOUTH TWICE DAILY What changed:   how much to take  how to take this  when to take this  additional instructions      Follow-up Information    Leilani Ableeese, Betti, MD. Schedule an appointment as soon as possible for a visit in 1 week(s).   Specialty: Family Medicine Why: for regular follow up and blood levels of keppra and lamictal Contact information: [address] [place] [address] [phone]        Elveria RisingGoodpasture, Tina, NP. Schedule an appointment as soon as possible for a visit in 1 week(s).   Specialties: Neurology, Pediatric Neurology Why: follow up of seizures, recently started on keppra and lamictal dose increased, needs follow up with levels and clinically Contact information: [address] [place] [address] [phone]            Time coordinating discharge: 39 minutes  Signed:  Keary Waterson  [facility] 04/04/2020, 3:12 PM

## 2020-04-04 NOTE — Progress Notes (Signed)
CSW called non-emergency dispatch and requested that [employer] perform a welfare check lettting family know pt is ready for D/C and requesting a call back from [facility] and/or family.  CSW will continue to follow for D/C needs.  Alyssa Wong. Abeeha Twist  MSW, LCSW, LCAS, CSI Transitions of Care Clinical Social Worker Care Coordination Department Ph: [phone]

## 2020-04-11 ENCOUNTER — Other Ambulatory Visit: Payer: Self-pay

## 2020-04-11 ENCOUNTER — Ambulatory Visit (INDEPENDENT_AMBULATORY_CARE_PROVIDER_SITE_OTHER): Payer: Medicaid Other | Admitting: Family

## 2020-04-11 ENCOUNTER — Encounter (INDEPENDENT_AMBULATORY_CARE_PROVIDER_SITE_OTHER): Payer: Self-pay | Admitting: Family

## 2020-04-11 VITALS — BP 100/70 | HR 72 | Wt 103.4 lb

## 2020-04-11 DIAGNOSIS — G40309 Generalized idiopathic epilepsy and epileptic syndromes, not intractable, without status epilepticus: Secondary | ICD-10-CM | POA: Diagnosis not present

## 2020-04-11 DIAGNOSIS — F819 Developmental disorder of scholastic skills, unspecified: Secondary | ICD-10-CM

## 2020-04-11 DIAGNOSIS — Q743 Arthrogryposis multiplex congenita: Secondary | ICD-10-CM

## 2020-04-11 DIAGNOSIS — G9341 Metabolic encephalopathy: Secondary | ICD-10-CM | POA: Diagnosis not present

## 2020-04-11 DIAGNOSIS — E7220 Disorder of urea cycle metabolism, unspecified: Secondary | ICD-10-CM | POA: Diagnosis not present

## 2020-04-11 DIAGNOSIS — Z79899 Other long term (current) drug therapy: Secondary | ICD-10-CM

## 2020-04-11 DIAGNOSIS — Z982 Presence of cerebrospinal fluid drainage device: Secondary | ICD-10-CM

## 2020-04-11 DIAGNOSIS — Q751 Craniofacial dysostosis: Secondary | ICD-10-CM

## 2020-04-11 DIAGNOSIS — G800 Spastic quadriplegic cerebral palsy: Secondary | ICD-10-CM

## 2020-04-11 NOTE — Patient Instructions (Signed)
Thank you for coming in today.   Instructions for you until your next appointment are as follows: 1. Continue giving the Levetiracetam (Keppra) 57ml morning and at night 2. I have given you a blood test order for [facility]. This can be done today at [facility] [address].  3. I will call you when I receive the blood test results 4. Be sure to follow up with Dr Pecola Leisure for [facility] blood pressure 5. For your concerns about Bahja's recent hospitalization - the number to call is [phone] - the Office of Patient Experience 6. Please sign up for MyChart if you have not done so 7. Please plan to return for follow up in 3 months or sooner if needed.

## 2020-04-11 NOTE — Progress Notes (Signed)
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 She is taking and tolerating Lamotrigine and Levetiracetam for seziures. She has dysphagia and requires feedings by g-tube, with Osmolyte as her formula.  Today's concerns:  Alyssa Wong returns today for follow up after hospitalization on Mar 26, 2020 for altered mental status. She was found to have mild hyperammonemia. Valproic acid was discontinued and she was started on Levetiracetam. Mom notes that she had 4 seizures while inpatient and medications were changed. She was also found to be hypertensive and Labetalol was initiated. Since discharge, Alyssa Wong has remained seizure free and has been back at her baseline for behavior.   Mom reports that Alyssa Wong had been doing well prior to the hospitalization. She has been otherwise generally healthy and Mom has no other health concerns for her today other than previously mentioned.   Review of systems:,  Please see HPI for neurologic and other pertinent review of systems. Otherwise all other systems were reviewed and were negative.  Problem List: Patient Active Problem List   Diagnosis Date Noted  . Metabolic encephalopathy 88/41/6606  . SIRS (systemic inflammatory response syndrome) ([place]) 03/26/2020  . Altered mental status 03/26/2020  . Hypertensive urgency 03/26/2020  . Status post insertion of percutaneous endoscopic gastrostomy (PEG) tube ([place]) 03/26/2020  . Hyperammonemia ([place]) 03/26/2020  . Lactic acidosis 03/26/2020  . Loss of  weight 07/17/2018  . 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 Drooling 03/04/2017  . 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 Encounter for long-term (current) use of other medications 12/29/2013  . 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 Intellectual delay   . S/P VP shunt   . Arthrogryposis multiplex congenita   . Crouzon syndrome   . Contractures involving both knees      Past Medical History:  Diagnosis Date  . Arthrogryposis multiplex congenita   . Contractures involving both knees   . Crouzon syndrome   . Intellectual delay   . Seizures ([place])   . 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 ELBOW SURGERY Left    Release of contractures  . GASTROSTOMY TUBE PLACEMENT    . HIP SURGERY       Family history: family history includes Kidney disease in her maternal grandfather.   Social history: Social History   Socioeconomic History  . Marital status: Single    Spouse name: Not on file  . Number of children: Not on file  . Years of education: Not on file  . Highest education level: Not on file  Occupational History  . Not on file  Tobacco Use  . Smoking status: Never Smoker  . Smokeless tobacco: Never Used  Substance and Sexual Activity  . Alcohol use:  No  . Drug use: No  . Sexual activity: Never  Other Topics Concern  . Not on file  Social History Narrative   Alyssa Wong is a young woman who graduated from [employer] with a certificate in 2013. She is enrolled in the day programs [facility] [employer] [facility]  [employer]. She lives with her mother and she has 3 siblings,  51 yo brother at home   Social Determinants of Health   Financial Resource Strain:   . Difficulty of Paying Living Expenses:   Food Insecurity:   . Worried About Programme researcher, broadcasting/film/video in the Last Year:   . Barista in the Last Year:   Transportation Needs:   . Freight forwarder (Medical):   Marland Kitchen Lack of Transportation (Non-Medical):   Physical Activity:   . Days of Exercise per Week:   . Minutes of Exercise per Session:   Stress:   . Feeling of Stress :   Social Connections:   . Frequency of Communication with Friends and Family:   . Frequency of Social Gatherings with Friends and Family:   . Attends Religious Services:   . Active Member of Clubs or Organizations:   . Attends Banker Meetings:   Marland Kitchen Marital Status:   Intimate Partner Violence:   . Fear of Current or Ex-Partner:   . Emotionally Abused:   Marland Kitchen Physically Abused:   . Sexually Abused:      Past/failed meds: Depakote - elevated ammonia and altered mental status  Allergies: No Known Allergies   Immunizations:  There is no immunization history on file for this patient.    Diagnostics/Screenings: 03/26/2020 - CT head wo contrast - 1. Unchanged position of right parietal approach shunt catheter with unchanged size and configuration of the ventricles. 2. No acute intracranial abnormality.  07/05/2006 - CT head wo contrast - Study technically limited to patient positioning and inability to straighten head. Right ventricular drain is in place without hydrocephalus. No definite acute intracranial abnormality  03/28/2020 - rEEG - This study showed evidence of epileptogenicity as well as cortical dysfunction in the right temporoparietal region consistent with underlying craniotomy.  Additionally, there is evidence of mild to moderate diffuse encephalopathy, nonspecific etiology but likely related to patient's history of cerebral palsy and  intellectual delay. No seizures were seen throughout the recording.   Physical Exam: BP 100/70   Pulse 72   Wt 103 lb 6.4 oz (46.9 kg)   LMP  (LMP Unknown)   BMI 18.91 kg/m   General: small statured but well developed, well nourished woman, seated in wheelchair, in no evident distress; black hair, brown eyes, even handed Head: dolichocephalic and atraumatic. Oropharynx appears benign but is difficult to examine due to her inability to cooperate with examination. She has dysmorphic features with exophthalmos, ocular hypertelorism and midface hypoplasia Neck: supple  Cardiovascular: regular rate and rhythm, no murmurs. Respiratory: Clear to auscultation bilaterally Abdomen: Bowel sounds present all four quadrants, abdomen soft, non-tender, non-distended. No hepatosplenomegaly or masses palpated.Gastrostomy tube in place Musculoskeletal: Spastic quadriparesis with arthrogryposis in the right upper extremity. Her back is arched and does not touch the back of the wheelchair Skin: no rashes or neurocutaneous lesions  Neurologic Exam Mental Status: Awake and fully alert. Has no language.  Smiles responsively at times. Resistant to invasions into her space Cranial Nerves: Fundoscopic exam - red reflex present.  Unable to fully visualize fundus.  Pupils equal briskly reactive to light. She has dysconjugate  eye movements. She does not turn to localize faces and objects in the periphery. Startles to sounds in the periphery. Facial movements are asymmetric, has lower facial weakness with drooling.  Neck is in an exaggerated flexion and extension Motor: Spastic quadriparesis with arthrogryposis in the right upper extremity. She has poor fine motor skills. Sensory: Withdrawal x 4 Coordination: Unable to adequately assess due to patient's inability to participate in examination. Did not reach for objects. Gait and Station: Unable to stand and bear weight.   Reflexes: Absent DTR's. Toes neutral. No  clonus  Impression: 1. Crouzon syndrome 2. Generalized convulsive epilepsy 3. Spastic quadriparesis 4. Contractures involving multiple joints 5. Arthrogryposis multiplex congenita 6. Significant developmental delay 7. S/P VP shunt   Recommendations for plan of care: The patient's previous [facility] were reviewed. Neshia has neither had nor required imaging or lab studies since the last visit other than what was performed earlier this month during her hospitalization for altered mental status. Mom is aware of all results. Arlyss is a 29 year old woman with history of Crouzon syndrome, generalized convulsive epilepsy, spastic quadriparesis, contractures at multiple sites, arthrogryposis multiplex congenita, significant developmental delay and VP shunt. She was recently hospitalized for altered mental status thought to be related to mild hyperammonemia. She had been taking Valproic Acid for years and this was changed to Levetiracetam. She has tolerated that well and has remained seizure free. Alyssa Wong is also taking Lamotrigine and that dose was not changed. I recommended to Mom that we repeat lab studies. I will call Mom when the results are available. I instructed Mom to follow up with Alyssa Wong's PCP about the Labetalol and her hypertension. I asked Mom to let me know if Alyssa Wong has any seizures or if she has any concerns. I will otherwise see her back in follow up in 3 months or sooner if needed. Mom agreed with the plans made today.   The medication list was reviewed and reconciled. No changes were made in the prescribed medications today. A complete medication list was provided to the patient.  Allergies as of 04/11/2020   No Known Allergies     Medication List       Accurate as of Apr 11, 2020 11:59 PM. If you have any questions, ask your nurse or doctor.        STOP taking these medications   valproic acid 250 MG/5ML solution Commonly known as: DEPAKENE Stopped by: [name],  NP     TAKE these medications   glycopyrrolate 2 MG tablet Commonly known as: ROBINUL TAKE 1/2 TO 1 TABLET BY MOUTH THREE TIMES DAILY AS NEEDED FOR DROOLING What changed:   how much to take  how to take this  when to take this  additional instructions   labetalol 100 MG tablet Commonly known as: NORMODYNE Take 1 tablet (100 mg total) by mouth 2 (two) times daily.   lactulose 10 GM/15ML solution Commonly known as: CHRONULAC Take 3.4 g by mouth 2 (two) times daily as needed for mild constipation or moderate constipation. Take 74mL by mouth twice daily as needed for constipation   lamoTRIgine 25 MG Chew chewable tablet Commonly known as: LAMICTAL Chew 3 tablets (75 mg total) by mouth 2 (two) times daily. CHEW AND SWALLOW if possible   levETIRAcetam 100 MG/ML solution Commonly known as: KEPPRA Place 10 mLs (1,000 mg total) into feeding tube 2 (two) times daily.   levonorgestrel-ethinyl estradiol 0.15-0.03 MG tablet Commonly known as: SEASONALE Take 1 tablet  by mouth daily.   OPCON-A OP Apply 1-2 drops to eye every 6 (six) hours as needed (eye redness).      I consulted with Dr Sharene Skeans regarding this patient.  Total time spent with the patient was 30 minutes, of which 50% or more was spent in counseling and coordination of care.  [name] NP-C [facility] Neurology Ph. [phone] Fax [phone]

## 2020-04-12 ENCOUNTER — Encounter (INDEPENDENT_AMBULATORY_CARE_PROVIDER_SITE_OTHER): Payer: Self-pay | Admitting: Family

## 2020-04-12 LAB — CBC WITH DIFFERENTIAL/PLATELET
Absolute Monocytes: 396 cells/uL (ref 200–950)
Basophils Absolute: 42 cells/uL (ref 0–200)
Basophils Relative: 1.2 %
Eosinophils Absolute: 112 cells/uL (ref 15–500)
Eosinophils Relative: 3.2 %
HCT: 38 % (ref 35.0–45.0)
Hemoglobin: 12.1 g/dL (ref 11.7–15.5)
Lymphs Abs: 1771 cells/uL (ref 850–3900)
MCH: 28.5 pg (ref 27.0–33.0)
MCHC: 31.8 g/dL — ABNORMAL LOW (ref 32.0–36.0)
MCV: 89.6 fL (ref 80.0–100.0)
MPV: 12.5 fL (ref 7.5–12.5)
Monocytes Relative: 11.3 %
Neutro Abs: 1180 cells/uL — ABNORMAL LOW (ref 1500–7800)
Neutrophils Relative %: 33.7 %
Platelets: 302 10*3/uL (ref 140–400)
RBC: 4.24 10*6/uL (ref 3.80–5.10)
RDW: 12 % (ref 11.0–15.0)
Total Lymphocyte: 50.6 %
WBC: 3.5 10*3/uL — ABNORMAL LOW (ref 3.8–10.8)

## 2020-04-12 LAB — BASIC METABOLIC PANEL
BUN: 13 mg/dL (ref 7–25)
CO2: 28 mmol/L (ref 20–32)
Calcium: 9.9 mg/dL (ref 8.6–10.2)
Chloride: 100 mmol/L (ref 98–110)
Creat: 0.65 mg/dL (ref 0.50–1.10)
Glucose, Bld: 59 mg/dL — ABNORMAL LOW (ref 65–99)
Potassium: 4.3 mmol/L (ref 3.5–5.3)
Sodium: 137 mmol/L (ref 135–146)

## 2020-04-12 LAB — AMMONIA: Ammonia: 52 umol/L (ref <=72)

## 2020-06-20 ENCOUNTER — Encounter (INDEPENDENT_AMBULATORY_CARE_PROVIDER_SITE_OTHER): Payer: Self-pay

## 2020-06-20 NOTE — Progress Notes (Signed)
Form for G tube signed by Inetta Fermo and faxed back- confirmation received- labeled and placed in batch scan envelope

## 2020-07-12 ENCOUNTER — Other Ambulatory Visit (INDEPENDENT_AMBULATORY_CARE_PROVIDER_SITE_OTHER): Payer: Self-pay | Admitting: Family

## 2020-07-12 ENCOUNTER — Ambulatory Visit (INDEPENDENT_AMBULATORY_CARE_PROVIDER_SITE_OTHER): Payer: Medicaid Other | Admitting: Family

## 2020-07-12 DIAGNOSIS — G40309 Generalized idiopathic epilepsy and epileptic syndromes, not intractable, without status epilepticus: Secondary | ICD-10-CM

## 2020-07-12 MED ORDER — LAMOTRIGINE 25 MG PO CHEW: 75.0000 mg | CHEWABLE_TABLET | Freq: Two times a day (BID) | ORAL | 5 refills | Status: DC

## 2020-07-21 ENCOUNTER — Encounter (INDEPENDENT_AMBULATORY_CARE_PROVIDER_SITE_OTHER): Payer: Self-pay | Admitting: Family

## 2020-07-21 ENCOUNTER — Other Ambulatory Visit: Payer: Self-pay

## 2020-07-21 ENCOUNTER — Ambulatory Visit (INDEPENDENT_AMBULATORY_CARE_PROVIDER_SITE_OTHER): Payer: Medicaid Other | Admitting: Family

## 2020-07-21 VITALS — BP 112/82 | HR 76 | Wt 104.2 lb

## 2020-07-21 DIAGNOSIS — G800 Spastic quadriplegic cerebral palsy: Secondary | ICD-10-CM

## 2020-07-21 DIAGNOSIS — Q751 Craniofacial dysostosis: Secondary | ICD-10-CM

## 2020-07-21 DIAGNOSIS — M24561 Contracture, right knee: Secondary | ICD-10-CM

## 2020-07-21 DIAGNOSIS — M24562 Contracture, left knee: Secondary | ICD-10-CM

## 2020-07-21 DIAGNOSIS — G40309 Generalized idiopathic epilepsy and epileptic syndromes, not intractable, without status epilepticus: Secondary | ICD-10-CM | POA: Diagnosis not present

## 2020-07-21 DIAGNOSIS — Z982 Presence of cerebrospinal fluid drainage device: Secondary | ICD-10-CM

## 2020-07-21 DIAGNOSIS — K117 Disturbances of salivary secretion: Secondary | ICD-10-CM | POA: Diagnosis not present

## 2020-07-21 DIAGNOSIS — F819 Developmental disorder of scholastic skills, unspecified: Secondary | ICD-10-CM

## 2020-07-21 DIAGNOSIS — Z931 Gastrostomy status: Secondary | ICD-10-CM

## 2020-07-21 DIAGNOSIS — Q743 Arthrogryposis multiplex congenita: Secondary | ICD-10-CM

## 2020-07-21 NOTE — Progress Notes (Signed)
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 She is taking and tolerating Lamotrigine and Levetiracetam for seziures. She has dysphagia and requires feedings by g-tube, with Osmolyte as her formula.  Today's concerns:  Mom reports today that Shelvie has been seizure free since her last visit. At that time she had been hospitalized for altered mental status and found to have mild hyperammonemia. She was switched from Depakote to Levetiracetam and has done well with that.   Mom has questions today regarding obtaining guardianship for Katleen. She has contacted the court system and is working on gathering documents needed for her to be named as Emergency planning/management officer guardian. Mom is also hopeful that she can enroll Deliah in a day program one day per week and needs a document explaining Naomee's condition for that.   Mom also had questions today about a special needs dentist for [employer]. Mom feels that her wisdom teeth may be coming in. Misheel used to be seen by Pediatric Dentristry at [facility] when she was a child but has not been seen in some time.   Itha has been otherwise generally healthy since she was last seen. Mom has no other health concerns for Mariadel today other than previously mentioned.  Review of systems: Please see HPI for neurologic and other pertinent review of systems. Otherwise all other systems were reviewed and were negative.  Problem List: Patient Active Problem List   Diagnosis Date Noted   Metabolic  encephalopathy 03/27/2020   SIRS (systemic inflammatory response syndrome) (HCC) 03/26/2020   Altered mental status 03/26/2020   Hypertensive urgency 03/26/2020   Status post insertion of percutaneous endoscopic gastrostomy (PEG) tube (HCC) 03/26/2020   Hyperammonemia (HCC) 03/26/2020   Lactic acidosis 03/26/2020   Loss of weight 07/17/2018   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   Drooling 03/04/2017   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   Encounter for long-term (current) use of other medications 12/29/2013   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)    Intellectual delay    S/P VP shunt    Arthrogryposis multiplex congenita    Crouzon syndrome    Contractures involving both knees      Past Medical History:  Diagnosis Date   Arthrogryposis multiplex congenita    Contractures involving both knees    Crouzon syndrome    Intellectual delay    Seizures (HCC)    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   Social history: Social History   Socioeconomic History   Marital status:  Single    Spouse name: Not on file   Number of children: Not on file   Years of education: Not on file   Highest education level: Not on file  Occupational History   Not on file  Tobacco Use   Smoking status: Never Smoker   Smokeless  tobacco: Never Used  Substance and Sexual Activity   Alcohol use: No   Drug use: No   Sexual activity: Never  Other Topics Concern   Not on file  Social History Narrative   Meila is a young woman who graduated from [employer] with a certificate in 2013. She is enrolled in the day programs [employer] [facility] [employer] [facility] [employer]. She lives with her mother and she has 3 siblings,  31 yo brother at home   Social Determinants of Health   Financial Resource Strain:    Difficulty of Paying Living Expenses: Not on file  Food Insecurity:    Worried About Programme researcher, broadcasting/film/video in the Last Year: Not on file   [employer] of Food in the Last Year: Not on file  Transportation Needs:    Lack of Transportation (Medical): Not on file   Lack of Transportation (Non-Medical): Not on file  Physical Activity:    Days of Exercise per Week: Not on file   Minutes of Exercise per Session: Not on file  Stress:    Feeling of Stress : Not on file  Social Connections:    Frequency of Communication with Friends and Family: Not on file   Frequency of Social Gatherings with Friends and Family: Not on file   Attends Religious Services: Not on file   Active Member of Clubs or Organizations: Not on file   Attends Banker Meetings: Not on file   Marital Status: Not on file  Intimate Partner Violence:    Fear of Current or Ex-Partner: Not on file   Emotionally Abused: Not on file   Physically Abused: Not on file   Sexually Abused: Not on file     Past/failed meds: Copied from previous record: Depakote - elevated ammonia and altered mental status  Allergies: No Known Allergies    Immunizations:  There is no immunization history on file for this patient.    Diagnostics/Screenings: Copied from previous record: 03/26/2020 - CT head wo contrast - 1. Unchanged position of right parietal approach shunt catheter with unchanged size and configuration of the  ventricles. 2. No acute intracranial abnormality.  07/05/2006 - CT head wo contrast -Study technically limited to patient positioning and inability to straighten head. Right ventricular drain is in place without hydrocephalus. No definite acute intracranial abnormality  03/28/2020 - rEEG - This studyshowed evidence of epileptogenicityas well as cortical dysfunction in the right temporoparietal region consistent with underlying craniotomy. Additionally, there is evidence of mild to moderate diffuse encephalopathy, nonspecific etiology but likely related to patient's history of cerebral palsy and intellectual delay.No seizures were seen throughout the recording.  Physical Exam: BP 112/82    Pulse 76    Wt 104 lb 3.2 oz (47.3 kg)    BMI 19.06 kg/m   General: small statured but well developed, well nourished woman,seated in wheelchair, in no evident distress; black hair, brown eyes, even handed Head: doliocephalic and atraumatic. Oropharynx appears benign but is difficult to examine due to her inability to cooperate with examination. She has dysmorphic features with exophthalmos, ocular hypertelorism and midface hyperplasia Neck: supple Cardiovascular: regular rate and rhythm, no murmurs. Respiratory:  clear to auscultation bilaterally Abdomen: bowel sounds present all four quadrants, abdomen soft, non-tender, non-distended. No hepatosplenomegaly or masses palpated.Gastrostomy tube in place. Musculoskeletal: spastic quadriparesis with arthrogryposis in the right upper extremity. Her back is arched and does not touch the back of the wheelchair Skin: no rashes or neurocutaneous lesions  Neurologic Exam Mental Status: awake and fully alert. Has no language.  Smiles responsively at times. Resistant to invasions into her space Cranial Nerves: fundoscopic exam - red reflex present.  Unable to fully visualize fundus.  Pupils equal briskly reactive to light.  She has dysconjugate eye movements and  does not turn to localize faces and objects in the periphery. Startles to sounds in the periphery. Facial movements are asymmetric, has lower facial weakness with drooling.  Neck is held in exaggerated flexion and extension Motor: spastic quadriparesis with arthrogryposis in the right upper extremity. She has very poor fine motor skills.   Sensory: withdrawal x 4 Coordination: unable to adequately assess due to patient's inability to participate in examination. Did not reach for objects. Gait and Station: unable to independently stand and bear weight. Able to stand with assistance but needs constant support. Able to take a few steps but has poor balance and needs support.  Reflexes: unable to adequately assess due to her inability to participate in examination  Impression: 1. Crouzon syndrome 2. Generalized convulsive epilepsy 3. Spastic quadriparesis 4. Contractures involving multiple joints 5. Arthrogryposis multiplex congenita 6. Significant developmental delay 7. S/P VP shunt  Recommendations for plan of care: [facility] Montel ClockKierra has neither had nor required imaging or lab studies since the last visit. She is a 29 year old woman with history of Crouzon syndrome, generalized convulsive epilepsy, spastic quadriparesis, arthrogryposis multiplex congenita, significant developmental delay and history of VP shunt. She is taking and tolerating Lamotrigine and Levetiracetam and has remained seizure free since her last visit.  She has not seen a dentist in years and I gave Mom a list of dentists to call to get her scheduled for an examination. We also talked about guardianship for Montel ClockKierra and I will write a letter of support for guardianship by her mother. I will send an additional copy for Mom to give to the day program to explain Netha's condition. I will otherwise see Montel ClockKierra back in follow up in 6 months or sooner if needed. Mom agreed with the plans made today.     The medication list was reviewed and reconciled. No changes were made in the prescribed medications today. A complete medication list was provided to the patient.  Allergies as of 07/21/2020   No Known Allergies     Medication List       Accurate as of July 21, 2020  3:07 PM. If you have any questions, ask your nurse or doctor.        glycopyrrolate 2 MG tablet Commonly known as: ROBINUL TAKE 1/2 TO 1 TABLET BY MOUTH THREE TIMES DAILY AS NEEDED FOR DROOLING What changed:   how much to take  how to take this  when to take this  additional instructions   labetalol 100 MG tablet Commonly known as: NORMODYNE Take 1 tablet (100 mg total) by mouth 2 (two) times daily.   lactulose 10 GM/15ML solution Commonly known as: CHRONULAC Take 3.4 g by mouth 2 (two) times daily as needed for mild constipation or moderate constipation. Take 5mL by mouth twice daily as needed for constipation   lamoTRIgine 25 MG Chew  chewable tablet Commonly known as: LAMICTAL Chew 3 tablets (75 mg total) by mouth 2 (two) times daily. CHEW AND SWALLOW if possible   levETIRAcetam 100 MG/ML solution Commonly known as: KEPPRA Place 10 mLs (1,000 mg total) into feeding tube 2 (two) times daily.   levonorgestrel-ethinyl estradiol 0.15-0.03 MG tablet Commonly known as: SEASONALE Take 1 tablet by mouth daily.   OPCON-A OP Apply 1-2 drops to eye every 6 (six) hours as needed (eye redness).       Total time spent with the patient was 20 minutes, of which 50% or more was spent in counseling and coordination of care.  [name] NP-C [facility] Neurology Ph. [phone] Fax [phone]

## 2020-07-22 ENCOUNTER — Encounter (INDEPENDENT_AMBULATORY_CARE_PROVIDER_SITE_OTHER): Payer: Self-pay | Admitting: Family

## 2020-07-22 MED ORDER — GLYCOPYRROLATE 2 MG PO TABS: 3.0000 mg | ORAL_TABLET | Freq: Two times a day (BID) | ORAL | 5 refills | Status: DC

## 2020-07-22 NOTE — Patient Instructions (Signed)
Thank you for coming in today.   Instructions for you until your next appointment are as follows: 1. Continue giving Stacie the Lamotrigine and Levetiracetam as prescribed 2. Let me know if she has any seizures 3. I have given you a list of dentists for [employer]. If you have trouble getting her scheduled, please let me know 4. I will write a letter of support for guardianship and will mail it to you. I will send an additional copy for you to give to the day program. 5. Please sign up for MyChart if you have not done so 6. Please plan to return for follow up in 6 months or sooner if needed.

## 2020-12-29 ENCOUNTER — Other Ambulatory Visit (INDEPENDENT_AMBULATORY_CARE_PROVIDER_SITE_OTHER): Payer: Self-pay | Admitting: Family

## 2020-12-29 DIAGNOSIS — G40309 Generalized idiopathic epilepsy and epileptic syndromes, not intractable, without status epilepticus: Secondary | ICD-10-CM

## 2021-01-04 ENCOUNTER — Telehealth (INDEPENDENT_AMBULATORY_CARE_PROVIDER_SITE_OTHER): Payer: Self-pay | Admitting: Family

## 2021-01-04 DIAGNOSIS — G40309 Generalized idiopathic epilepsy and epileptic syndromes, not intractable, without status epilepticus: Secondary | ICD-10-CM

## 2021-01-04 MED ORDER — LEVETIRACETAM 100 MG/ML PO SOLN: 1000.0000 mg | Freq: Two times a day (BID) | ORAL | 5 refills | Status: DC

## 2021-01-04 NOTE — Telephone Encounter (Signed)
  Who's calling (name and relationship to patient) : Misty Stanley ( mom)  Best contact number: [phone]  Provider they see: [name]  Reason for call: Mom calling to get patients prescription filled she said pharmacy has sent over request but the doctor that originally wrote the prescription was in the Er mom didn't know if Inetta Fermo could fill the prescription.  Patient does have an upcoming appointment on te 25th of February. Please Advise       PRESCRIPTION REFILL ONLY  Name of prescription: Keppra   Pharmacy:[facility]  [address] [place]

## 2021-01-04 NOTE — Telephone Encounter (Signed)
I called Mom and told her that I will send in refills for Keppra. TG

## 2021-01-19 ENCOUNTER — Other Ambulatory Visit: Payer: Self-pay

## 2021-01-19 ENCOUNTER — Encounter (INDEPENDENT_AMBULATORY_CARE_PROVIDER_SITE_OTHER): Payer: Self-pay | Admitting: Family

## 2021-01-19 ENCOUNTER — Ambulatory Visit (INDEPENDENT_AMBULATORY_CARE_PROVIDER_SITE_OTHER): Payer: Medicaid Other | Admitting: Family

## 2021-01-19 VITALS — BP 146/98 | HR 80 | Wt 110.6 lb

## 2021-01-19 DIAGNOSIS — G40309 Generalized idiopathic epilepsy and epileptic syndromes, not intractable, without status epilepticus: Secondary | ICD-10-CM

## 2021-01-19 DIAGNOSIS — M24561 Contracture, right knee: Secondary | ICD-10-CM

## 2021-01-19 DIAGNOSIS — Z931 Gastrostomy status: Secondary | ICD-10-CM

## 2021-01-19 DIAGNOSIS — Q751 Craniofacial dysostosis: Secondary | ICD-10-CM

## 2021-01-19 DIAGNOSIS — G800 Spastic quadriplegic cerebral palsy: Secondary | ICD-10-CM | POA: Diagnosis not present

## 2021-01-19 DIAGNOSIS — Q743 Arthrogryposis multiplex congenita: Secondary | ICD-10-CM

## 2021-01-19 DIAGNOSIS — Z982 Presence of cerebrospinal fluid drainage device: Secondary | ICD-10-CM

## 2021-01-19 DIAGNOSIS — F819 Developmental disorder of scholastic skills, unspecified: Secondary | ICD-10-CM

## 2021-01-19 DIAGNOSIS — M24562 Contracture, left knee: Secondary | ICD-10-CM

## 2021-01-19 NOTE — Progress Notes (Signed)
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 She is taking and tolerating Lamotrigine andLevetiracetamfor seziures. She has dysphagia and requires feedings by g-tube, with Osmolyte as her formula.  Today's concerns: Mom reports today that Alyssa Wong has remained seizure free since her last visit. She is compliant with medications and generally sleeps well at night. Mom notes that Alyssa Wong tolerates her feedings well.   Alyssa Wong has been otherwise generally healthy since she was last seen. Mom has no other health concerns for Alyssa Wong today other than previously mentioned.  Review of systems: Please see HPI for neurologic and other pertinent review of systems. Otherwise all other systems were reviewed and were negative.  Problem List: Patient Active Problem List   Diagnosis Date Noted  . Metabolic encephalopathy 03/27/2020  . SIRS (systemic inflammatory response syndrome) (HCC) 03/26/2020  . Altered mental status 03/26/2020  . Hypertensive urgency 03/26/2020  . Status post insertion of percutaneous endoscopic gastrostomy (PEG) tube (HCC) 03/26/2020  . Hyperammonemia (HCC) 03/26/2020  . Lactic acidosis 03/26/2020  . Loss of weight 07/17/2018  . 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 Drooling 03/04/2017  . 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 Encounter for long-term (current) use of other medications 12/29/2013  . 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 Intellectual delay   .  S/P VP shunt   . Arthrogryposis multiplex congenita   . Crouzon syndrome   . Contractures involving both knees      Past Medical History:  Diagnosis Date  . Arthrogryposis multiplex congenita   . Contractures involving both knees   . Crouzon syndrome   . Intellectual delay   . Seizures (HCC)   . 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 ELBOW SURGERY Left    Release of contractures  . GASTROSTOMY TUBE PLACEMENT    . HIP SURGERY       Family history: family history includes Kidney disease in her maternal grandfather.   Social history: Social History   Socioeconomic History  . Marital status: Single    Spouse name: Not on file  . Number of children: Not on file  . Years of education: Not on file  . Highest education level: Not on file  Occupational History  . Not on file  Tobacco Use  . Smoking status: Never Smoker  . Smokeless tobacco: Never Used  Substance and Sexual Activity  . Alcohol use: No  . Drug use: No  . Sexual activity: Never  Other Topics Concern  . Not on file  Social History Narrative   Alyssa Wong is a young woman who graduated from [employer] with a certificate in 2013. She is enrolled in the day programs [employer] [facility] [employer].  She lives with her mother and she has 3 siblings,  32 yo brother at home   Social Determinants of Corporate investment banker Strain: Not on file  Food Insecurity: Not on file  Transportation Needs: Not on file  Physical Activity: Not on file  Stress: Not on file  Social Connections:  Not on file  Intimate Partner Violence: Not on file    Past/failed meds: Copied from previous record: Depakote - elevated ammonia and altered mental status  Allergies: No Known Allergies   Immunizations:  There is no immunization history on file for this patient.    Diagnostics/Screenings: Copied from previous record: 03/26/2020 - CT head wo contrast -1. Unchanged position of right parietal approach shunt catheter with unchanged size and configuration of the ventricles. 2. No acute intracranial abnormality.  07/05/2006 - CT head wo contrast -Study technically limited to patient positioning and inability to straighten head. Right ventricular drain is in place without hydrocephalus. No definiteacute intracranial abnormality  03/28/2020 - rEEG -This studyshowed evidence of epileptogenicityas well as cortical dysfunction in the right temporoparietal region consistent with underlying craniotomy. Additionally, there is evidence of mild to moderate diffuse encephalopathy, nonspecific etiology but likely related to patient's history of cerebral palsy and intellectual delay.No seizures were seen throughout the recording.  Physical Exam: BP (!) 146/98   Pulse 80   Wt 110 lb 9.6 oz (50.2 kg)   BMI 20.23 kg/m   General: small statured but well developed, well nourished woman, seated in wheelchair, in no evident distress; black hair, brown eyes, even handed Head: doliocephalic and atraumatic. Oropharynx appears benign but is difficult to examine due to inability to cooperate with examination. She has dysmorphic features with exophthalmos, ocular hypertelorism, and midface hyperplasia Neck: supple Cardiovascular: regular rate and rhythm, no murmurs. Respiratory: clear to auscultation bilaterally Abdomen: bowel sounds present all four quadrants, abdomen soft, non-tender, non-distended. No hepatosplenomegaly or masses palpated.Gastrostomy tube in place clean and dry Musculoskeletal:  spastic quadriparesis with arthrogryposis in the right upper extremity. Her back is arched and doesn't touch the wheelchair  Skin: no rashes or neurocutaneous lesions  Neurologic Exam Mental Status: awake and fully alert. Has no language.  Smiles responsively at times. Resistant to invasions into her space Cranial Nerves: fundoscopic exam - red reflex present.  Unable to fully visualize fundus.  Pupils equal briskly reactive to light.  Turns to localize faces and objects in the periphery. Turns to localize sounds in the periphery. Facial movements are symmetric, has lower facial weakness with mild drooling.  Motor: spastic quadriparesis with arthrogryposis in the right upper extremity. Very poor fine motor skills.  Sensory: withdrawal x 4 Coordination: unable to adequately assess due to patient's inability to participate in examination. Does not reach for objects. Gait and Station: unable to stand and bear weight.  Reflexes: unable to adequately assess due to her inability to cooperate with examination  Impression: 1. Crouzon syndrome 2. Generalized convulsive epilepsy 3. Spastic quadriparesis 4. Contractures involving multiple joints 5. Arthrogryposis multiplex congenita 6. Significant developmental delay 7. S/P VP shunt   Recommendations for plan of care: The patient's previous [facility] records were reviewed. Beryle has neither had nor required imaging or lab studies since the last visit. She is a 30 year old woman with history of Crouzon syndrome, generalized convulsive epilepsy, spastic quadriparesis, contractures involving multiple joints, arthrogryposis multiplex congenita, significant developmental delay, and S/P VP shunt. She is taking and tolerating Lamotrigine and Levetiracetam, and has remained seizure free on these medications.  Irianna is doing well at this time. I will see her back in follow up in 6 months or sooner if needed.   The medication list was reviewed and reconciled. No  changes were made in the prescribed medications today. A complete medication list was provided to the patient.  Allergies as of 01/19/2021   No Known Allergies     Medication List       Accurate as of January 19, 2021  3:00 PM. If you have any questions, ask your nurse or doctor.        glycopyrrolate 2 MG tablet Commonly known as: ROBINUL Take 1.5 tablets (3 mg total) by mouth 2 (two) times daily.   labetalol 100 MG tablet Commonly known as: NORMODYNE Take 1 tablet (100 mg total) by mouth 2 (two) times daily.   lactulose 10 GM/15ML solution Commonly known as: CHRONULAC Take 3.4 g by mouth 2 (two) times daily as needed for mild constipation or moderate constipation. Take 40mL by mouth twice daily as needed for constipation   lamoTRIgine 25 MG Chew chewable tablet Commonly known as: LAMICTAL CHEW AND SWALLOW 3 TABLETS BY MOUTH TWICE DAILY. CHEW IF POSSIBLE, IF NOT USE PEG TUBE   levETIRAcetam 100 MG/ML solution Commonly known as: KEPPRA Place 10 mLs (1,000 mg total) into feeding tube 2 (two) times daily.   levonorgestrel-ethinyl estradiol 0.15-0.03 MG tablet Commonly known as: SEASONALE Take 1 tablet by mouth daily.   OPCON-A OP Apply 1-2 drops to eye every 6 (six) hours as needed (eye redness).      Total time spent with the patient was 20 minutes, of which 50% or more was spent in counseling and coordination of care.  [name] NP-C [facility] Ph. [phone] Fax [phone]

## 2021-01-24 ENCOUNTER — Other Ambulatory Visit (INDEPENDENT_AMBULATORY_CARE_PROVIDER_SITE_OTHER): Payer: Self-pay | Admitting: Family

## 2021-01-24 DIAGNOSIS — G40309 Generalized idiopathic epilepsy and epileptic syndromes, not intractable, without status epilepticus: Secondary | ICD-10-CM

## 2021-01-26 ENCOUNTER — Encounter (INDEPENDENT_AMBULATORY_CARE_PROVIDER_SITE_OTHER): Payer: Self-pay | Admitting: Family

## 2021-01-26 NOTE — Patient Instructions (Signed)
Thank you for coming in today.   Instructions for you until your next appointment are as follows: 1. Continue giving the Lamotrigine and Levetiracetam as prescribed 2. Let me know if Alyssa Wong has any seizures 3. Please sign up for MyChart if you have not done so. 4. Please plan to return for follow up in 6 months or sooner if needed.

## 2021-03-05 ENCOUNTER — Telehealth (INDEPENDENT_AMBULATORY_CARE_PROVIDER_SITE_OTHER): Payer: Self-pay | Admitting: Family

## 2021-03-05 DIAGNOSIS — G800 Spastic quadriplegic cerebral palsy: Secondary | ICD-10-CM

## 2021-03-05 DIAGNOSIS — K117 Disturbances of salivary secretion: Secondary | ICD-10-CM

## 2021-03-05 DIAGNOSIS — G40309 Generalized idiopathic epilepsy and epileptic syndromes, not intractable, without status epilepticus: Secondary | ICD-10-CM

## 2021-03-05 MED ORDER — LEVETIRACETAM 100 MG/ML PO SOLN: 1000.0000 mg | Freq: Two times a day (BID) | ORAL | 5 refills | Status: DC

## 2021-03-05 MED ORDER — GLYCOPYRROLATE 2 MG PO TABS: 3.0000 mg | ORAL_TABLET | Freq: Two times a day (BID) | ORAL | 5 refills | Status: DC

## 2021-03-05 MED ORDER — LAMOTRIGINE 25 MG PO CHEW: CHEWABLE_TABLET | ORAL | 3 refills | Status: DC

## 2021-03-05 NOTE — Telephone Encounter (Signed)
  Who's calling (name and relationship to patient) : Misty Stanley ( mom)  Best contact number: [phone]  Provider they see: [name]   Reason for call: Patient is totally out of medication and mom has already contacted the pharmacy they should have faxed over a request. Mom calling to just help in getting it filled ASAP she would like to be able to pick it up today      PRESCRIPTION REFILL ONLY  Name of prescription: glycopyrrolate   Pharmacy:[facility] [address] [place]

## 2021-03-05 NOTE — Telephone Encounter (Signed)
Mom is aware

## 2021-03-05 NOTE — Telephone Encounter (Signed)
Rx sent electronically. Please let Mom know. Thanks, Inetta Fermo

## 2021-03-06 ENCOUNTER — Encounter (INDEPENDENT_AMBULATORY_CARE_PROVIDER_SITE_OTHER): Payer: Self-pay | Admitting: Dietician

## 2021-03-27 ENCOUNTER — Other Ambulatory Visit: Payer: Self-pay

## 2021-03-27 ENCOUNTER — Emergency Department (HOSPITAL_COMMUNITY): Payer: Medicaid Other

## 2021-03-27 ENCOUNTER — Inpatient Hospital Stay (HOSPITAL_COMMUNITY)
Admission: EM | Admit: 2021-03-27 | Discharge: 2021-03-29 | DRG: 100 | Disposition: A | Discharge status: Home / Self Care | Payer: Medicaid Other | Attending: Internal Medicine | Admitting: Internal Medicine

## 2021-03-27 ENCOUNTER — Encounter (HOSPITAL_COMMUNITY): Payer: Self-pay

## 2021-03-27 DIAGNOSIS — T85618A Breakdown (mechanical) of other specified internal prosthetic devices, implants and grafts, initial encounter: Secondary | ICD-10-CM

## 2021-03-27 DIAGNOSIS — Z982 Presence of cerebrospinal fluid drainage device: Secondary | ICD-10-CM

## 2021-03-27 DIAGNOSIS — Z79899 Other long term (current) drug therapy: Secondary | ICD-10-CM

## 2021-03-27 DIAGNOSIS — G40909 Epilepsy, unspecified, not intractable, without status epilepticus: Secondary | ICD-10-CM

## 2021-03-27 DIAGNOSIS — E7220 Disorder of urea cycle metabolism, unspecified: Secondary | ICD-10-CM

## 2021-03-27 DIAGNOSIS — Z931 Gastrostomy status: Secondary | ICD-10-CM

## 2021-03-27 DIAGNOSIS — G40309 Generalized idiopathic epilepsy and epileptic syndromes, not intractable, without status epilepticus: Secondary | ICD-10-CM

## 2021-03-27 DIAGNOSIS — Q743 Arthrogryposis multiplex congenita: Secondary | ICD-10-CM

## 2021-03-27 DIAGNOSIS — G800 Spastic quadriplegic cerebral palsy: Secondary | ICD-10-CM | POA: Diagnosis present

## 2021-03-27 DIAGNOSIS — Q751 Craniofacial dysostosis: Secondary | ICD-10-CM

## 2021-03-27 DIAGNOSIS — G9341 Metabolic encephalopathy: Secondary | ICD-10-CM | POA: Diagnosis present

## 2021-03-27 DIAGNOSIS — N179 Acute kidney failure, unspecified: Secondary | ICD-10-CM | POA: Diagnosis present

## 2021-03-27 DIAGNOSIS — R569 Unspecified convulsions: Secondary | ICD-10-CM

## 2021-03-27 DIAGNOSIS — E872 Acidosis: Secondary | ICD-10-CM | POA: Diagnosis present

## 2021-03-27 DIAGNOSIS — Z20822 Contact with and (suspected) exposure to covid-19: Secondary | ICD-10-CM | POA: Diagnosis present

## 2021-03-27 DIAGNOSIS — G40419 Other generalized epilepsy and epileptic syndromes, intractable, without status epilepticus: Principal | ICD-10-CM | POA: Diagnosis present

## 2021-03-27 LAB — I-STAT VENOUS BLOOD GAS, ED
Acid-base deficit: 5 mmol/L — ABNORMAL HIGH (ref 0.0–2.0)
Bicarbonate: 21 mmol/L (ref 20.0–28.0)
Calcium, Ion: 1.05 mmol/L — ABNORMAL LOW (ref 1.15–1.40)
HCT: 55 % — ABNORMAL HIGH (ref 36.0–46.0)
Hemoglobin: 18.7 g/dL — ABNORMAL HIGH (ref 12.0–15.0)
O2 Saturation: 99 %
Potassium: 4.4 mmol/L (ref 3.5–5.1)
Sodium: 134 mmol/L — ABNORMAL LOW (ref 135–145)
TCO2: 22 mmol/L (ref 22–32)
pCO2, Ven: 42.7 mmHg — ABNORMAL LOW (ref 44.0–60.0)
pH, Ven: 7.3 (ref 7.250–7.430)
pO2, Ven: 144 mmHg — ABNORMAL HIGH (ref 32.0–45.0)

## 2021-03-27 LAB — COMPREHENSIVE METABOLIC PANEL
ALT: 97 U/L — ABNORMAL HIGH (ref 0–44)
AST: 59 U/L — ABNORMAL HIGH (ref 15–41)
Albumin: 3.5 g/dL (ref 3.5–5.0)
Alkaline Phosphatase: 70 U/L (ref 38–126)
Anion gap: 19 — ABNORMAL HIGH (ref 5–15)
BUN: 21 mg/dL — ABNORMAL HIGH (ref 6–20)
CO2: 19 mmol/L — ABNORMAL LOW (ref 22–32)
Calcium: 8.7 mg/dL — ABNORMAL LOW (ref 8.9–10.3)
Chloride: 95 mmol/L — ABNORMAL LOW (ref 98–111)
Creatinine, Ser: 1.24 mg/dL — ABNORMAL HIGH (ref 0.44–1.00)
GFR, Estimated: 60 mL/min — ABNORMAL LOW (ref >60)
Glucose, Bld: 160 mg/dL — ABNORMAL HIGH (ref 70–99)
Potassium: 4.7 mmol/L (ref 3.5–5.1)
Sodium: 133 mmol/L — ABNORMAL LOW (ref 135–145)
Total Bilirubin: 0.6 mg/dL (ref 0.3–1.2)
Total Protein: 6.9 g/dL (ref 6.5–8.1)

## 2021-03-27 LAB — I-STAT BETA HCG BLOOD, ED (MC, WL, AP ONLY): I-stat hCG, quantitative: <5 m[IU]/mL (ref <5)

## 2021-03-27 LAB — CBC
HCT: 53 % — ABNORMAL HIGH (ref 36.0–46.0)
Hemoglobin: 17.2 g/dL — ABNORMAL HIGH (ref 12.0–15.0)
MCH: 27.9 pg (ref 26.0–34.0)
MCHC: 32.5 g/dL (ref 30.0–36.0)
MCV: 85.9 fL (ref 80.0–100.0)
Platelets: 321 10*3/uL (ref 150–400)
RBC: 6.17 MIL/uL — ABNORMAL HIGH (ref 3.87–5.11)
RDW: 12.5 % (ref 11.5–15.5)
WBC: 16.7 10*3/uL — ABNORMAL HIGH (ref 4.0–10.5)
nRBC: 0 % (ref 0.0–0.2)

## 2021-03-27 LAB — MAGNESIUM: Magnesium: 3.2 mg/dL — ABNORMAL HIGH (ref 1.7–2.4)

## 2021-03-27 MED ORDER — SODIUM CHLORIDE 0.9 % IV BOLUS (SEPSIS)
1000.0000 mL | Freq: Once | INTRAVENOUS | Status: AC
  Administered 2021-03-27: 1000 mL via INTRAVENOUS

## 2021-03-27 MED ORDER — SODIUM CHLORIDE 0.9 % IV SOLN
1000.0000 mL | INTRAVENOUS | Status: DC
  Administered 2021-03-27 – 2021-03-28 (×2): 1000 mL via INTRAVENOUS

## 2021-03-27 MED ORDER — LEVETIRACETAM IN NACL 1000 MG/100ML IV SOLN
1000.0000 mg | Freq: Once | INTRAVENOUS | Status: AC
  Administered 2021-03-27: 1000 mg via INTRAVENOUS
  Filled 2021-03-27: qty 100

## 2021-03-27 NOTE — ED Provider Notes (Signed)
[facility]   CSN: [id number] Arrival date & time: 03/27/21  2126     History Chief complaint seizures  Alyssa Wong is a 30 y.o. female.  HPI   Patient presents to the ED for evaluation of multiple seizures.  Patient does have history of Crouzon syndrome as well as seizure disorder.  Patient started having seizures this evening.  She had multiple back-to-back and ongoing seizures for 1 hrs.  EMS was called.  They administered Versed with eventual resolution of the seizures after maximum doses.  Mom states patient has had some cough symptoms recently and was started on steroids but no known fevers.  No vomiting or diarrhea.  Past Medical History:  Diagnosis Date  . Arthrogryposis multiplex congenita   . Contractures involving both knees   . Crouzon syndrome   . Intellectual delay   . Seizures (HCC)   . 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 VP (ventriculoperitoneal) shunt status     Patient Active Problem List   Diagnosis Date Noted  . Metabolic encephalopathy 03/27/2020  . SIRS (systemic inflammatory response syndrome) (HCC) 03/26/2020  . Altered mental status 03/26/2020  . Hypertensive urgency 03/26/2020  . Status post insertion of percutaneous endoscopic gastrostomy (PEG) tube (HCC) 03/26/2020  . Hyperammonemia (HCC) 03/26/2020  . Lactic acidosis 03/26/2020  . Loss of weight 07/17/2018  . 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 Drooling 03/04/2017  . 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 Encounter for long-term (current) use of other medications 12/29/2013  . 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 Intellectual delay   . S/P VP shunt   . Arthrogryposis multiplex congenita   . Crouzon syndrome   . Contractures involving both knees     Past Surgical History:  Procedure Laterality Date  . ELBOW SURGERY Left    Release of contractures  . GASTROSTOMY  TUBE PLACEMENT    . HIP SURGERY       OB History   No obstetric history on file.     Family History  Problem Relation Age of Onset  . Kidney disease Maternal Grandfather        Died at 5129    Social History   Tobacco Use  . Smoking status: Never Smoker  . Smokeless tobacco: Never Used  Substance Use Topics  . Alcohol use: No  . Drug use: No    Home Medications Prior to Admission medications   Medication Sig Start Date End Date Taking? Authorizing Provider  glycopyrrolate (ROBINUL) 2 MG tablet Take 1.5 tablets (3 mg total) by mouth 2 (two) times daily. 03/05/21   Elveria RisingGoodpasture, Tina, NP  labetalol (NORMODYNE) 100 MG tablet Take 1 tablet (100 mg total) by mouth 2 (two) times daily. Patient not taking: Reported on 01/19/2021 04/04/20   Joycelyn DasPokhrel, Laxman, MD  lactulose (CHRONULAC) 10 GM/15ML solution Take 3.4 g by mouth 2 (two) times daily as needed for mild constipation or moderate constipation. Take 5mL by mouth twice daily as needed for constipation 05/13/16   [provider]  lamoTRIgine (LAMICTAL) 25 MG CHEW chewable tablet CHEW AND SWALLOW 3 TABLETS BY MOUTH TWICE DAILY, CHEW IF POSSIBLE, IF NOT USE PEG TUBE 03/05/21   Elveria RisingGoodpasture, Tina, NP  levETIRAcetam (KEPPRA) 100 MG/ML solution Place 10 mLs (1,000 mg total) into feeding tube 2 (two) times daily. 03/05/21   Elveria RisingGoodpasture, Tina, NP  levonorgestrel-ethinyl estradiol (SEASONALE) 0.15-0.03 MG tablet Take 1 tablet by mouth  daily. 01/04/20   [provider]  Naphazoline-Pheniramine (OPCON-A OP) Apply 1-2 drops to eye every 6 (six) hours as needed (eye redness). Patient not taking: Reported on 01/19/2021    [provider]    Allergies    Patient has no known allergies.  Review of Systems   Review of Systems  All other systems reviewed and are negative.   Physical Exam Updated Vital Signs BP 133/71 (BP Location: Right Arm)   Pulse (!) 153   Resp (!) 23   SpO2 99%   Physical Exam Vitals and nursing note  reviewed.  Constitutional:      Appearance: She is well-developed. She is not diaphoretic.  HENT:     Head: Normocephalic and atraumatic.     Right Ear: External ear normal.     Left Ear: External ear normal.  Eyes:     General: No scleral icterus.       Right eye: No discharge.        Left eye: No discharge.     Conjunctiva/sclera: Conjunctivae normal.  Neck:     Trachea: No tracheal deviation.  Cardiovascular:     Rate and Rhythm: Normal rate and regular rhythm.  Pulmonary:     Effort: Pulmonary effort is normal. No respiratory distress.     Breath sounds: Normal breath sounds. No stridor. No wheezing or rales.     Comments: Sonorous respirations Abdominal:     General: Bowel sounds are normal. There is no distension.     Palpations: Abdomen is soft.     Tenderness: There is no abdominal tenderness. There is no guarding or rebound.  Musculoskeletal:        General: No tenderness.     Cervical back: Neck supple.  Skin:    General: Skin is warm and dry.     Findings: No rash.  Neurological:     Mental Status: Mental status is at baseline. She is lethargic.     Cranial Nerves: No cranial nerve deficit (no facial droop, patient is noncommunicative).     Motor: Atrophy present. No seizure activity.     ED Results / Procedures / Treatments   Labs (all labs ordered are listed, but only abnormal results are displayed) Labs Reviewed  COMPREHENSIVE METABOLIC PANEL - Abnormal; Notable for the following components:      Result Value   Sodium 133 (*)    Chloride 95 (*)    CO2 19 (*)    Glucose, Bld 160 (*)    BUN 21 (*)    Creatinine, Ser 1.24 (*)    Calcium 8.7 (*)    AST 59 (*)    ALT 97 (*)    GFR, Estimated 60 (*)    Anion gap 19 (*)    All other components within normal limits  CBC - Abnormal; Notable for the following components:   WBC 16.7 (*)    RBC 6.17 (*)    Hemoglobin 17.2 (*)    HCT 53.0 (*)    All other components within normal limits  MAGNESIUM -  Abnormal; Notable for the following components:   Magnesium 3.2 (*)    All other components within normal limits  I-STAT VENOUS BLOOD GAS, ED - Abnormal; Notable for the following components:   pCO2, Ven 42.7 (*)    pO2, Ven 144.0 (*)    Acid-base deficit 5.0 (*)    Sodium 134 (*)    Calcium, Ion 1.05 (*)    HCT 55.0 (*)  Hemoglobin 18.7 (*)    All other components within normal limits  LEVETIRACETAM LEVEL  URINALYSIS, ROUTINE W REFLEX MICROSCOPIC  LAMOTRIGINE LEVEL  TSH  T4, FREE  AMMONIA  I-STAT BETA HCG BLOOD, ED ([facility], [facility], [facility] ONLY)  CBG MONITORING, ED    EKG EKG Interpretation  Date/Time:  Tuesday Mar 27 2021 21:40:41 EDT Ventricular Rate:  150 PR Interval:  135 QRS Duration: 68 QT Interval:  260 QTC Calculation: 411 R Axis:   118 Text Interpretation: Sinus tachycardia LAE, consider biatrial enlargement Left posterior fascicular block Since last tracing rate faster Confirmed by Linwood Dibbles [phone]) on 03/27/2021 9:49:31 PM   Radiology CT Head Wo Contrast  Addendum Date: 03/27/2021   ADDENDUM REPORT: 03/27/2021 22:47 ADDENDUM: Critical Value/emergent results were called by telephone at the time of physician contact on 03/27/2021 at 10:47 pm to provider Deyton Ellenbecker , who verbally acknowledged these results. Electronically Signed   By: Kreg Shropshire M.D.   On: 03/27/2021 22:47   Result Date: 03/27/2021 CLINICAL DATA:  Tonic colonic seizure for 1 hour in 20 minutes, intermittently approximately 45 seconds apart EXAM: CT HEAD WITHOUT CONTRAST TECHNIQUE: Contiguous axial images were obtained from the base of the skull through the vertex without intravenous contrast. COMPARISON:  CT head 03/26/2020 FINDINGS: Brain: Parietal approach shunt catheter tip terminates near the superior midline approximating the falx. There is a shifting ventricular configuration with slight dilatation of the right lateral ventricles and more effacement of the left lateral ventricle. Slightly increased caliber of  the third ventricle is well. Fourth ventricle is unchanged. Some increasingly conspicuous cortical hypoattenuation is seen across the right temporoparietal region as well as portion of the anterior mesial occipital lobe (4/26, 5/25). No hyperdense hemorrhage. No other mass effect or midline shift. Vascular: No hyperdense vessel or unexpected calcification. Skull: Evidence of multiple prior craniotomies. Overlying scalp thickening and scarring is unchanged from comparison. No acute fracture or conspicuous osseous lesions seen. Sinuses/Orbits: Marked exophthalmos. Mild soft tissue thickening across the left periorbital soft tissues. Thickening in the paranasal sinuses. No layering air-fluid levels or pneumatized secretions. Stable radiodensities noted in the frontal sinus outflow tract. Discontinuity of the posterior septum. Other: Impacted dentition in the maxilla. Reconstructive changes of the right sphenoid wing IMPRESSION: 1. Right area of right temporoparietal and occipital cortical hypoattenuation, also age findings could be seen in the setting of active seizure, some underlying ischemia may be present as well. 2. Right parietal approach shunt catheter. Shifting caliber of the ventricles including dilatation of the right lateral ventricle when compared to prior. Correlate for features of hydrocephalus. 3. Increasing left periorbital soft tissue swelling and inflammatory changes on a background of marked exophthalmos. 4. Numerous prior craniotomies and reconstructive changes along the right sphenoid wing. 5. Sclerotic changes of the maxilla likely related to chronically impacted dentition. Currently attempting to contact the ordering provider with a critical value result. Addendum will be submitted upon case discussion. Electronically Signed: By: Kreg Shropshire M.D. On: 03/27/2021 22:37   DG Chest Portable 1 View  Result Date: 03/27/2021 CLINICAL DATA:  Seizures EXAM: PORTABLE CHEST 1 VIEW COMPARISON:  None.  FINDINGS: Lungs are clear. No pneumothorax or pleural effusion. Cardiac size within normal limits. No acute bone abnormality on this rotated examination. Widening of the right paratracheal stripe likely relates to rotation. IMPRESSION: Widening of the right paratracheal stripe likely relates to rotation. This could be confirmed with repeat imaging with attention to patient positioning. No definite acute cardiopulmonary disease. Electronically Signed  By: Helyn Numbers MD   On: 03/27/2021 22:14    Procedures .Critical Care Performed by: Linwood Dibbles, MD Authorized by: Linwood Dibbles, MD   Critical care provider statement:    Critical care time (minutes):  45   Critical care was time spent personally by me on the following activities:  Discussions with consultants, evaluation of patient's response to treatment, examination of patient, ordering and performing treatments and interventions, ordering and review of laboratory studies, ordering and review of radiographic studies, pulse oximetry, re-evaluation of patient's condition, obtaining history from patient or surrogate and review of old charts     Medications Ordered in ED Medications  sodium chloride 0.9 % bolus 1,000 mL (0 mLs Intravenous Stopped 03/27/21 2257)    Followed by  0.9 %  sodium chloride infusion (has no administration in time range)  levETIRAcetam (KEPPRA) IVPB 1000 mg/100 mL premix (0 mg Intravenous Stopped 03/27/21 2239)    ED Course  I have reviewed the triage vital signs and the nursing notes.  Pertinent labs & imaging results that were available during my care of the patient were reviewed by me and considered in my medical decision making (see chart for details).  Clinical Course as of 03/27/21 2348  Tue Mar 27, 2021  2251 Patient still has not had any recurrent seizures.  Heart rate has improved down into the 120s at the bedside [JK]  2303 Discussed with Dr Wilford Corner.  Would not pursue CT findings further.  Continue to monitor  in ED.  Need for admission dependent on ed workup and pt's resoibse, [JK]  2305 Chest x-ray without acute findings [JK]    Clinical Course User Index [JK] Linwood Dibbles, MD   MDM Rules/Calculators/A&P                          Pt with prolonged seizure today, 1 hr.   Has remained seizure free in the ED.  Case discussed with Dr Wilford Corner. Labs pending.  Check for uti.  Consider obs admission for dc with close follow up pending workup and Mom's assessment of pt's return to baseline.   Care turned over to Dr Bebe Shaggy. Final Clinical Impression(s) / ED Diagnoses Final diagnoses:  Seizure disorder [facility])      Linwood Dibbles, MD 03/27/21 2348

## 2021-03-27 NOTE — ED Triage Notes (Signed)
EMS arrival from home per EMS tonic clonic seizures x1hr 20 mins on and off 45secs apart. Per EMS pt has hx of seizures, non verbal and contractures. EMS administered 10mg  IM versed last given at 2105, with no seizure activity noted since. 170s-180s ST, 150/100, cbg 149, 40RR

## 2021-03-27 NOTE — ED Notes (Signed)
Patient transported to CT 

## 2021-03-28 ENCOUNTER — Inpatient Hospital Stay (HOSPITAL_COMMUNITY): Payer: Medicaid Other

## 2021-03-28 DIAGNOSIS — G40419 Other generalized epilepsy and epileptic syndromes, intractable, without status epilepticus: Secondary | ICD-10-CM | POA: Diagnosis present

## 2021-03-28 DIAGNOSIS — T85618A Breakdown (mechanical) of other specified internal prosthetic devices, implants and grafts, initial encounter: Secondary | ICD-10-CM | POA: Diagnosis not present

## 2021-03-28 DIAGNOSIS — N179 Acute kidney failure, unspecified: Secondary | ICD-10-CM | POA: Diagnosis present

## 2021-03-28 DIAGNOSIS — Q743 Arthrogryposis multiplex congenita: Secondary | ICD-10-CM | POA: Diagnosis not present

## 2021-03-28 DIAGNOSIS — R569 Unspecified convulsions: Secondary | ICD-10-CM | POA: Diagnosis present

## 2021-03-28 DIAGNOSIS — G800 Spastic quadriplegic cerebral palsy: Secondary | ICD-10-CM | POA: Diagnosis present

## 2021-03-28 DIAGNOSIS — Q751 Craniofacial dysostosis: Secondary | ICD-10-CM | POA: Diagnosis not present

## 2021-03-28 DIAGNOSIS — Z982 Presence of cerebrospinal fluid drainage device: Secondary | ICD-10-CM | POA: Diagnosis not present

## 2021-03-28 DIAGNOSIS — Z931 Gastrostomy status: Secondary | ICD-10-CM

## 2021-03-28 DIAGNOSIS — Z79899 Other long term (current) drug therapy: Secondary | ICD-10-CM | POA: Diagnosis not present

## 2021-03-28 DIAGNOSIS — E7220 Disorder of urea cycle metabolism, unspecified: Secondary | ICD-10-CM

## 2021-03-28 DIAGNOSIS — Z20822 Contact with and (suspected) exposure to covid-19: Secondary | ICD-10-CM | POA: Diagnosis present

## 2021-03-28 DIAGNOSIS — G9341 Metabolic encephalopathy: Secondary | ICD-10-CM | POA: Diagnosis present

## 2021-03-28 DIAGNOSIS — E872 Acidosis: Secondary | ICD-10-CM | POA: Diagnosis present

## 2021-03-28 LAB — URINALYSIS, ROUTINE W REFLEX MICROSCOPIC
Bilirubin Urine: NEGATIVE
Glucose, UA: NEGATIVE mg/dL
Ketones, ur: NEGATIVE mg/dL
Nitrite: NEGATIVE
Protein, ur: NEGATIVE mg/dL
Specific Gravity, Urine: 1.009 (ref 1.005–1.030)
pH: 9 — ABNORMAL HIGH (ref 5.0–8.0)

## 2021-03-28 LAB — RESP PANEL BY RT-PCR (FLU A&B, COVID) ARPGX2
Influenza A by PCR: NEGATIVE
Influenza B by PCR: NEGATIVE
SARS Coronavirus 2 by RT PCR: NEGATIVE

## 2021-03-28 LAB — COMPREHENSIVE METABOLIC PANEL
ALT: 87 U/L — ABNORMAL HIGH (ref 0–44)
AST: 44 U/L — ABNORMAL HIGH (ref 15–41)
Albumin: 3.2 g/dL — ABNORMAL LOW (ref 3.5–5.0)
Alkaline Phosphatase: 56 U/L (ref 38–126)
Anion gap: 5 (ref 5–15)
BUN: 15 mg/dL (ref 6–20)
CO2: 27 mmol/L (ref 22–32)
Calcium: 8 mg/dL — ABNORMAL LOW (ref 8.9–10.3)
Chloride: 102 mmol/L (ref 98–111)
Creatinine, Ser: 0.58 mg/dL (ref 0.44–1.00)
GFR, Estimated: >60 mL/min (ref >60)
Glucose, Bld: 71 mg/dL (ref 70–99)
Potassium: 4 mmol/L (ref 3.5–5.1)
Sodium: 134 mmol/L — ABNORMAL LOW (ref 135–145)
Total Bilirubin: 0.6 mg/dL (ref 0.3–1.2)
Total Protein: 5.8 g/dL — ABNORMAL LOW (ref 6.5–8.1)

## 2021-03-28 LAB — CBC
HCT: 48.3 % — ABNORMAL HIGH (ref 36.0–46.0)
Hemoglobin: 15.6 g/dL — ABNORMAL HIGH (ref 12.0–15.0)
MCH: 27.4 pg (ref 26.0–34.0)
MCHC: 32.3 g/dL (ref 30.0–36.0)
MCV: 84.7 fL (ref 80.0–100.0)
Platelets: 243 10*3/uL (ref 150–400)
RBC: 5.7 MIL/uL — ABNORMAL HIGH (ref 3.87–5.11)
RDW: 12.5 % (ref 11.5–15.5)
WBC: 14.5 10*3/uL — ABNORMAL HIGH (ref 4.0–10.5)
nRBC: 0 % (ref 0.0–0.2)

## 2021-03-28 LAB — CBG MONITORING, ED: Glucose-Capillary: 93 mg/dL (ref 70–99)

## 2021-03-28 LAB — HIV ANTIBODY (ROUTINE TESTING W REFLEX): HIV Screen 4th Generation wRfx: NONREACTIVE

## 2021-03-28 LAB — LACTIC ACID, PLASMA: Lactic Acid, Venous: 1.6 mmol/L (ref 0.5–1.9)

## 2021-03-28 LAB — AMMONIA: Ammonia: 86 umol/L — ABNORMAL HIGH (ref 9–35)

## 2021-03-28 MED ORDER — ENOXAPARIN SODIUM 40 MG/0.4ML IJ SOSY
40.0000 mg | PREFILLED_SYRINGE | INTRAMUSCULAR | Status: DC
  Administered 2021-03-28: 40 mg via SUBCUTANEOUS
  Filled 2021-03-28: qty 0.4

## 2021-03-28 MED ORDER — ONDANSETRON HCL 4 MG PO TABS: 4.0000 mg | ORAL_TABLET | Freq: Four times a day (QID) | ORAL | Status: DC | PRN

## 2021-03-28 MED ORDER — SODIUM CHLORIDE 0.9 % IV BOLUS (SEPSIS)
1000.0000 mL | Freq: Once | INTRAVENOUS | Status: AC
  Administered 2021-03-28: 1000 mL via INTRAVENOUS

## 2021-03-28 MED ORDER — LABETALOL HCL 5 MG/ML IV SOLN
10.0000 mg | INTRAVENOUS | Status: DC | PRN
  Administered 2021-03-28: 10 mg via INTRAVENOUS
  Filled 2021-03-28: qty 4

## 2021-03-28 MED ORDER — SODIUM CHLORIDE 0.9 % IV SOLN: 1000.0000 mL | INTRAVENOUS | Status: DC

## 2021-03-28 MED ORDER — LEVETIRACETAM IN NACL 1000 MG/100ML IV SOLN
1000.0000 mg | Freq: Two times a day (BID) | INTRAVENOUS | Status: DC
  Administered 2021-03-28 – 2021-03-29 (×3): 1000 mg via INTRAVENOUS
  Filled 2021-03-28 (×3): qty 100

## 2021-03-28 MED ORDER — LACTULOSE 10 GM/15ML PO SOLN
10.0000 g | Freq: Two times a day (BID) | ORAL | Status: DC
  Administered 2021-03-28 – 2021-03-29 (×3): 10 g
  Filled 2021-03-28: qty 30
  Filled 2021-03-28: qty 15
  Filled 2021-03-28: qty 30
  Filled 2021-03-28 (×2): qty 15

## 2021-03-28 MED ORDER — ONDANSETRON HCL 4 MG/2ML IJ SOLN: 4.0000 mg | Freq: Four times a day (QID) | INTRAMUSCULAR | Status: DC | PRN

## 2021-03-28 MED ORDER — ACETAMINOPHEN 325 MG PO TABS: 650.0000 mg | ORAL_TABLET | Freq: Four times a day (QID) | ORAL | Status: DC | PRN

## 2021-03-28 MED ORDER — ACETAMINOPHEN 650 MG RE SUPP: 650.0000 mg | Freq: Four times a day (QID) | RECTAL | Status: DC | PRN

## 2021-03-28 MED ORDER — LABETALOL HCL 5 MG/ML IV SOLN
10.0000 mg | INTRAVENOUS | Status: DC | PRN
  Administered 2021-03-28 – 2021-03-29 (×3): 10 mg via INTRAVENOUS
  Filled 2021-03-28 (×3): qty 4

## 2021-03-28 MED ORDER — LAMOTRIGINE 25 MG PO TABS
75.0000 mg | ORAL_TABLET | Freq: Two times a day (BID) | ORAL | Status: DC
  Administered 2021-03-28 – 2021-03-29 (×3): 75 mg
  Filled 2021-03-28 (×4): qty 3

## 2021-03-28 NOTE — ED Provider Notes (Signed)
I assumed care of patient at signout. Patient has history of Crouzon syndrome and seizures. She came in with prolonged seizure. Patient is now sleeping, but tachycardic.  Plan is to follow-up on labs.  If any abnormalities or patient is not at baseline, would consider admission.  Discussed this with mother who was agreeable with plan   Alyssa Rhine, MD 03/28/21 [phone]

## 2021-03-28 NOTE — ED Notes (Signed)
EEG tech at bedside. 

## 2021-03-28 NOTE — ED Notes (Signed)
Unable to perform in and out catherization of pt, this RN and Landry Corporal has multiple attempts without success, pure wick in place at this time post peri care.

## 2021-03-28 NOTE — Progress Notes (Signed)
Pt remains somnolent.  Telemetry placed on patient; CCMD called and second verification completed.  Suction and seizure pads in place.  No family at bedside. Pt is moving minimally in bed.  BP 149/119; Dr. Denton Lank notified.  Bed in low position; bed alarm activated; call bell within reach.

## 2021-03-28 NOTE — Consult Note (Signed)
Neurology Consultation  Reason for Consult: Breakthrough seizures Referring Physician: Dr. Bebe Shaggy, EDP  CC: Breakthrough seizures  History is obtained from: Mother, chart  HPI: Alyssa Wong is a 30 y.o. female past medical history of Crouzon syndrome, intellectual delay, seizures, cerebral palsy with spastic quadriparesis, status post VP shunt, currently on Keppra and Lamictal, nonverbal at baseline, presented to the emergency room for increasing frequency of tonic-clonic seizures that she said that she had about 1 hour 20 minutes of on and off multiple seizures since this evening. She usually gets no more than 1-2 seizures a month but this was way out of the regular frequency for her seizures. She is followed by [name], NP, pediatric neurology. She was admitted last year with increasing lethargy and possible increasing frequency of seizures.  Found to have ammonia levels of 100-while she was on Depakote at that time which was discontinued. Mother reports that she had been on steroids for respiratory infection for the last 1 week. After seizures today- she was brought in by EMS-10 mg of IM Versed were administered on the way with abatement of seizure activity.  Her heart rate was in 170s to 180s which eventually came down to 130s for a while and now is in the 100-110 range.  ROS: Full ROS was performed and is negative except as noted in the HPI.   Past Medical History:  Diagnosis Date  . Arthrogryposis multiplex congenita   . Contractures involving both knees   . Crouzon syndrome   . Intellectual delay   . Seizures (HCC)   . 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 VP (ventriculoperitoneal) shunt status     Family History  Problem Relation Age of Onset  . Kidney disease Maternal Grandfather        Died at 57   Social History:   reports that she has never smoked. She has never used smokeless tobacco. She reports that she does not drink alcohol and  does not use drugs.  Medications  Current Facility-Administered Medications:  .  [COMPLETED] sodium chloride 0.9 % bolus 1,000 mL, 1,000 mL, Intravenous, Once, Stopped at 03/27/21 2257 **FOLLOWED BY** 0.9 %  sodium chloride infusion, 1,000 mL, Intravenous, Continuous, Linwood Dibbles, MD, Last Rate: 125 mL/hr at 03/27/21 2320, 1,000 mL at 03/27/21 2320 .  acetaminophen (TYLENOL) tablet 650 mg, 650 mg, Oral, Q6H PRN **OR** acetaminophen (TYLENOL) suppository 650 mg, 650 mg, Rectal, Q6H PRN, Hillary Bow, DO .  enoxaparin (LOVENOX) injection 40 mg, 40 mg, Subcutaneous, Q24H, Gardner, Jared M, DO .  lactulose (CHRONULAC) 10 GM/15ML solution 10 g, 10 g, Per Tube, BID, Julian Reil, Jared M, DO .  lamoTRIgine (LAMICTAL) tablet 75 mg, 75 mg, Per Tube, BID, Julian Reil, Jared M, DO .  levETIRAcetam (KEPPRA) IVPB 1000 mg/100 mL premix, 1,000 mg, Intravenous, Q12H, Gardner, Jared M, DO .  ondansetron (ZOFRAN) tablet 4 mg, 4 mg, Oral, Q6H PRN **OR** ondansetron (ZOFRAN) injection 4 mg, 4 mg, Intravenous, Q6H PRN, Julian Reil, Jared M, DO .  sodium chloride 0.9 % bolus 1,000 mL, 1,000 mL, Intravenous, Once, Zadie Rhine, MD, Last Rate: 2,000 mL/hr at 03/28/21 0240, 1,000 mL at 03/28/21 0240  Current Outpatient Medications:  .  glycopyrrolate (ROBINUL) 2 MG tablet, Take 1.5 tablets (3 mg total) by mouth 2 (two) times daily., Disp: 90 tablet, Rfl: 5 .  lactulose (CHRONULAC) 10 GM/15ML solution, Take 3.4 g by mouth 2 (two) times daily. Take 12mL by mouth twice daily as needed for constipation,  Disp: , Rfl:  .  lamoTRIgine (LAMICTAL) 25 MG CHEW chewable tablet, CHEW AND SWALLOW 3 TABLETS BY MOUTH TWICE DAILY, CHEW IF POSSIBLE, IF NOT USE PEG TUBE (Patient taking differently: Chew 75 mg by mouth in the morning and at bedtime.), Disp: 180 tablet, Rfl: 3 .  levETIRAcetam (KEPPRA) 100 MG/ML solution, Place 10 mLs (1,000 mg total) into feeding tube 2 (two) times daily., Disp: 680 mL, Rfl: 5 .  levonorgestrel-ethinyl estradiol  (SEASONALE) 0.15-0.03 MG tablet, Take 1 tablet by mouth daily., Disp: , Rfl:    Exam: Current vital signs: BP 127/90   Pulse (!) 130   Temp 99.4 F (37.4 C) (Rectal)   Resp 16   Ht 5\' 2"  (1.575 m)   Wt 50.2 kg   SpO2 100%   BMI 20.24 kg/m  Vital signs in last 24 hours: Temp:  [99.4 F (37.4 C)] 99.4 F (37.4 C) (05/04 0115) Pulse Rate:  [122-153] 130 (05/04 0115) Resp:  [16-23] 16 (05/04 0230) BP: (114-148)/(71-100) 127/90 (05/04 0230) SpO2:  [89 %-100 %] 100 % (05/04 0115) Weight:  [50.2 kg] 50.2 kg (05/04 0216) General: Extremely drowsy, on supplemental oxygen HEENT: Left exophthalmos and left conjunctival redness, frontal bossing, hypoplastic maxilla CVS: Mildly tachycardic Respiratory: Saturating normally on supplemental oxygen Abd: ND NT Ext: mild left knee swelling, no tenderness on calf palpation b/l Neurological exam Mental status: No spontaneous movements but opens eyes somewhat to noxious stimulation. Speech and language: Nonverbal, does not follow commands Cranial nerves: Pupils are equal round reactive to light, she has right exotropia and disconjugate gaze, with left exophthalmos, difficult assess facial symmetry but at rest appears symmetric but dysmorphic. Motor exam: Spastic quadriparesis with worse tone in the upper extremities.  Mild eye-opening and some grimace to noxious stimulation in upper extremities and attempt to localize when noxious stimulation is applied to lower extremities. Sensory exam: As above Difficult assess coordination   Labs I have reviewed labs in epic and the results pertinent to this consultation are:  CBC    Component Value Date/Time   WBC 16.7 (H) 03/27/2021 2150   RBC 6.17 (H) 03/27/2021 2150   HGB 18.7 (H) 03/27/2021 2219   HCT 55.0 (H) 03/27/2021 2219   PLT 321 03/27/2021 2150   MCV 85.9 03/27/2021 2150   MCH 27.9 03/27/2021 2150   MCHC 32.5 03/27/2021 2150   RDW 12.5 03/27/2021 2150   LYMPHSABS 1,771 04/11/2020 0954    MONOABS 1.5 (H) 03/27/2020 0543   EOSABS 112 04/11/2020 0954   BASOSABS 42 04/11/2020 0954    CMP     Component Value Date/Time   NA 134 (L) 03/27/2021 2219   K 4.4 03/27/2021 2219   CL 95 (L) 03/27/2021 2150   CO2 19 (L) 03/27/2021 2150   GLUCOSE 160 (H) 03/27/2021 2150   BUN 21 (H) 03/27/2021 2150   CREATININE 1.24 (H) 03/27/2021 2150   CREATININE 0.65 04/11/2020 0954   CALCIUM 8.7 (L) 03/27/2021 2150   PROT 6.9 03/27/2021 2150   ALBUMIN 3.5 03/27/2021 2150   AST 59 (H) 03/27/2021 2150   ALT 97 (H) 03/27/2021 2150   ALKPHOS 70 03/27/2021 2150   BILITOT 0.6 03/27/2021 2150   GFRNONAA 60 (L) 03/27/2021 2150   GFRAA >60 04/04/2020 0604   Ammonia 86  Imaging I have reviewed the images obtained: CT-scan of the brain- right temporoparietal and occipital cortical hypoattenuation question underlying ischemia versus seizure edema.  Right parietal approach shunt catheter-shifting caliber ventricles including dilation of the right  lateral ventricle when compared to prior.  Increasing left periorbital soft tissue swelling and inflammatory changes on a background of marked exophthalmos.  Numerous prior craniotomies and reconstructive changes along the right sphenoid wing.  Sclerotic changes of the maxilla likely related to chronically impacted dentition.  Assessment:  30 year old with past history of Crouzon syndrome, intellectual delay, seizures, cerebral palsy with spastic quadriparesis status post VP shunt presenting for prolonged breakthrough seizures. Needs evaluation for underlying infections. Labs show increased ammonia lebvels. Not on Depakote any more. Don't suspect current AEDs causing hyperammonemia. Imaging of the head in the form of CT scan concerning for some right temporoparietal and occipital cortical hypoattenuation but motion degraded scan.  Also concerning for increased ventricular size - question shunt malfunction.   Impression:  Breakthrough seizure-evaluate for  underlying causative factors  Hyperammonemia   Known seizure disorder, developmental delay, cerebral palsy with spastic quadriparesis, status post VP shunt  Evaluate for shunt malfunction  Recommendations:  Check shunt patency with shunt series x-ray  Continue home doses of Keppra and Lamictal  EEG  Correction of hyperammonemia per primary team with lactulose  MRI brain in the morning-might not be possible because of the age of the shunt but that can be checked with the MR technologist in the morning.  Urinalysis  Discussed with Drs. Bebe Shaggy and Geophysicist/field seismologist.  Plan discussed with mother-answered all questions.  -- Milon Dikes, MD Neurologist [facility] Pager: [phone]

## 2021-03-28 NOTE — ED Notes (Signed)
Bebe Shaggy, MD made aware of difficulty performing in and out

## 2021-03-28 NOTE — Progress Notes (Signed)
Pt admitted to 3W27 from ER at this time; post ictal; somnolent.  EEG technician at bedside performing EEG.  Will complete assessment after he completes test.

## 2021-03-28 NOTE — ED Provider Notes (Signed)
Patient is afebrile.  Nursing staff was unable to obtain a urine at this time.  It does note the patient has hyperammonemia. Per discussion with Dr. Wilford Corner with neurology, will admit to the hospital for continuous EEG.  We will also need to have her hyperammonemia treated with likely increased lactulose dosing Will continue IV fluids.  Discussed with Dr. Julian Reil for admission   Zadie Rhine, MD 03/28/21 0201

## 2021-03-28 NOTE — Progress Notes (Signed)
EEG complete - results pending 

## 2021-03-28 NOTE — H&P (Addendum)
History and Physical    Alyssa Wong TAE:[id number] DOB: Jan 30, 1991 DOA: 03/27/2021  PCP: Lin Landsman, MD  Patient coming from: Home  I have personally briefly reviewed patient's old medical records in [place]  Chief Complaint: Seizures  HPI: Alyssa Wong is a 30 y.o. female with medical history significant of Crouzon syndrome, cerebral palsy, intellectual delay, seizures, spastic quadriparesis, VP shunt placed in 1992 shortly after birth.  Pt at baseline is non-verbal, uses peg tube for all PO intake, can ambulate with 2 person assist.  Pt usually has no more than 1-2 seizures a month, but has had increasing frequency of seizures recently.  Today had 1h 5mn of off and on seizures.  This prompted mother to bring her in to ED.  Pt unable to contribute to history secondary to being post ictal.   ED Course: Ammonia 86  CT head: ? Hydrocephalus, also hypo attenuation findings ? Seizure.  CXR neg  Tachy to 110.  UA neg, WBC 16.7k.  Mag 3.2  Creat 1.2  AG 19.   Review of Systems: Unable to perform due to pt mental status.  Past Medical History:  Diagnosis Date  . Arthrogryposis multiplex congenita   . Contractures involving both knees   . Crouzon syndrome   . Intellectual delay   . [place]   . 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 VP (ventriculoperitoneal) shunt status     Past Surgical History:  Procedure Laterality Date  . ELBOW SURGERY Left    Release of contractures  . GASTROSTOMY TUBE PLACEMENT    . HIP SURGERY       reports that she has never smoked. She has never used smokeless tobacco. She reports that she does not drink alcohol and does not use drugs.  No Known Allergies  Family History  Problem Relation Age of Onset  . Kidney disease Maternal Grandfather        Died at 242    Prior to Admission medications   Medication Sig Start Date End Date Taking? Authorizing Provider  glycopyrrolate (ROBINUL) 2  MG tablet Take 1.5 tablets (3 mg total) by mouth 2 (two) times daily. 03/05/21  Yes GRockwell [place] NP  lactulose (CHRONULAC) 10 GM/15ML solution Take 3.4 g by mouth 2 (two) times daily. Take 586mby mouth twice daily as needed for constipation 05/13/16  Yes [provider]  lamoTRIgine (LAMICTAL) 25 MG CHEW chewable tablet CHEW AND SWALLOW 3 TABLETS BY MOUTH TWICE DAILY, CHEW IF POSSIBLE, IF NOT USE PEG TUBE Patient taking differently: Chew 75 mg by mouth in the morning and at bedtime. 03/05/21  Yes GoRockwell GermanyNP  levETIRAcetam (KEPPRA) 100 MG/ML solution Place 10 mLs (1,000 mg total) into feeding tube 2 (two) times daily. 03/05/21  Yes GoRockwell GermanyNP  levonorgestrel-ethinyl estradiol (SEASONALE) 0.15-0.03 MG tablet Take 1 tablet by mouth daily. 01/04/20  Yes [provider]    Physical Exam: Vitals:   03/28/21 0115 03/28/21 0215 03/28/21 0216 03/28/21 0230  BP: (!) 148/100 114/87  127/90  Pulse: (!) 130     Resp: (!) 21 16  16   Temp: 99.4 F (37.4 C)     TempSrc: Rectal     SpO2: 100%     Weight:   50.2 kg   Height:   5' 2"  (1.575 m)     Constitutional: Drowsy, post-ictal Eyes: PERRL, lids and conjunctivae normal ENMT: left exophthalmos, left conjunctival redness, hypoplastic maxilla Neck: normal, supple,  no masses, no thyromegaly Respiratory: clear to auscultation bilaterally, no wheezing, no crackles. Normal respiratory effort. No accessory muscle use.  Cardiovascular: Regular rate and rhythm, no murmurs / rubs / gallops. No extremity edema. 2+ pedal pulses. No carotid bruits.  Abdomen: no tenderness, no masses palpated. No hepatosplenomegaly. Bowel sounds positive.  Musculoskeletal: no clubbing / cyanosis. No joint deformity upper and lower extremities. Good ROM, no contractures. Normal muscle tone.  Skin: no rashes, lesions, ulcers. No induration Neurologic: CN 2-12 grossly intact. Sensation intact, DTR normal. Strength 5/5 in all 4.  Psychiatric:  Non-verbal   Labs on Admission: I have personally reviewed following labs and imaging studies  CBC: Recent Labs  Lab 03/27/21 2150 03/27/21 2219  WBC 16.7*  --   HGB 17.2* 18.7*  HCT 53.0* 55.0*  MCV 85.9  --   PLT 321  --    Basic Metabolic Panel: Recent Labs  Lab 03/27/21 2150 03/27/21 2219  NA 133* 134*  K 4.7 4.4  CL 95*  --   CO2 19*  --   GLUCOSE 160*  --   BUN 21*  --   CREATININE 1.24*  --   CALCIUM 8.7*  --   MG 3.2*  --    GFR: Estimated Creatinine Clearance: 52 mL/min (A) (by C-G formula based on SCr of 1.24 mg/dL (H)). Liver Function Tests: Recent Labs  Lab 03/27/21 2150  AST 59*  ALT 97*  ALKPHOS 70  BILITOT 0.6  PROT 6.9  ALBUMIN 3.5   No results for input(s): LIPASE, AMYLASE in the last 168 hours. Recent Labs  Lab 03/27/21 2304  AMMONIA 86*   Coagulation Profile: No results for input(s): INR, PROTIME in the last 168 hours. Cardiac Enzymes: No results for input(s): CKTOTAL, CKMB, CKMBINDEX, TROPONINI in the last 168 hours. BNP (last 3 results) No results for input(s): PROBNP in the last 8760 hours. HbA1C: No results for input(s): HGBA1C in the last 72 hours. CBG: Recent Labs  Lab 03/28/21 0314  GLUCAP 93   Lipid Profile: No results for input(s): CHOL, HDL, LDLCALC, TRIG, CHOLHDL, LDLDIRECT in the last 72 hours. Thyroid Function Tests: No results for input(s): TSH, T4TOTAL, FREET4, T3FREE, THYROIDAB in the last 72 hours. Anemia Panel: No results for input(s): VITAMINB12, FOLATE, FERRITIN, TIBC, IRON, RETICCTPCT in the last 72 hours. Urine analysis:    Component Value Date/Time   COLORURINE YELLOW 03/26/2020 1609   APPEARANCEUR CLOUDY (A) 03/26/2020 1609   LABSPEC 1.023 03/26/2020 1609   PHURINE 8.0 03/26/2020 1609   GLUCOSEU 50 (A) 03/26/2020 1609   HGBUR NEGATIVE 03/26/2020 1609   BILIRUBINUR NEGATIVE 03/26/2020 1609   KETONESUR 5 (A) 03/26/2020 1609   PROTEINUR 100 (A) 03/26/2020 1609   NITRITE NEGATIVE 03/26/2020 1609    LEUKOCYTESUR NEGATIVE 03/26/2020 1609    Radiological Exams on Admission: CT Head Wo Contrast  Addendum Date: 03/27/2021   ADDENDUM REPORT: 03/27/2021 22:47 ADDENDUM: Critical Value/emergent results were called by telephone at the time of physician contact on 03/27/2021 at 10:47 pm to provider JON KNAPP , who verbally acknowledged these results. Electronically Signed   By: Lovena Le M.D.   On: 03/27/2021 22:47   Result Date: 03/27/2021 CLINICAL DATA:  Tonic colonic seizure for 1 hour in 20 minutes, intermittently approximately 45 seconds apart EXAM: CT HEAD WITHOUT CONTRAST TECHNIQUE: Contiguous axial images were obtained from the base of the skull through the vertex without intravenous contrast. COMPARISON:  CT head 03/26/2020 FINDINGS: Brain: Parietal approach shunt catheter tip terminates near  the superior midline approximating the falx. There is a shifting ventricular configuration with slight dilatation of the right lateral ventricles and more effacement of the left lateral ventricle. Slightly increased caliber of the third ventricle is well. Fourth ventricle is unchanged. Some increasingly conspicuous cortical hypoattenuation is seen across the right temporoparietal region as well as portion of the anterior mesial occipital lobe (4/26, 5/25). No hyperdense hemorrhage. No other mass effect or midline shift. Vascular: No hyperdense vessel or unexpected calcification. Skull: Evidence of multiple prior craniotomies. Overlying scalp thickening and scarring is unchanged from comparison. No acute fracture or conspicuous osseous lesions seen. Sinuses/Orbits: Marked exophthalmos. Mild soft tissue thickening across the left periorbital soft tissues. Thickening in the paranasal sinuses. No layering air-fluid levels or pneumatized secretions. Stable radiodensities noted in the frontal sinus outflow tract. Discontinuity of the posterior septum. Other: Impacted dentition in the maxilla. Reconstructive changes of  the right sphenoid wing IMPRESSION: 1. Right area of right temporoparietal and occipital cortical hypoattenuation, also age findings could be seen in the setting of active seizure, some underlying ischemia may be present as well. 2. Right parietal approach shunt catheter. Shifting caliber of the ventricles including dilatation of the right lateral ventricle when compared to prior. Correlate for features of hydrocephalus. 3. Increasing left periorbital soft tissue swelling and inflammatory changes on a background of marked exophthalmos. 4. Numerous prior craniotomies and reconstructive changes along the right sphenoid wing. 5. Sclerotic changes of the maxilla likely related to chronically impacted dentition. Currently attempting to contact the ordering provider with a critical value result. Addendum will be submitted upon case discussion. Electronically Signed: By: Lovena Le M.D. On: 03/27/2021 22:37   DG Chest Portable 1 View  Result Date: 03/27/2021 CLINICAL DATA:  Seizures EXAM: PORTABLE CHEST 1 VIEW COMPARISON:  None. FINDINGS: Lungs are clear. No pneumothorax or pleural effusion. Cardiac size within normal limits. No acute bone abnormality on this rotated examination. Widening of the right paratracheal stripe likely relates to rotation. IMPRESSION: Widening of the right paratracheal stripe likely relates to rotation. This could be confirmed with repeat imaging with attention to patient positioning. No definite acute cardiopulmonary disease. Electronically Signed   By: Fidela Salisbury MD   On: 03/27/2021 22:14    EKG: Independently reviewed.  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 Seizure - 1. Neuro consult 2. Currently pt is post-ictal 3. Continue current  home seizure meds 1. Keppra 1g BID (will make IV for the moment) 2. Lamictal 75 BID per PEG 4. EEG in AM 5. Seizure precautions 6. Tele monitor 7. Check UA 8. Check shunt series X ray to r/o shunt malfunction 1. Update: Shunt has fracture at level of clavicle! 2. Neurology thinks this is why the increased ventricle size on CT scan, and possibly the reason for increased frequency of seizures 3. Consult neuro surgery in AM. 9. Per neurology update: no need to get MRI at this time. 2. Hyperammonemia - 1. Present in 2021 as well 2. At that time believed to be due to depakote which was stopped and pt put on lamictal instead 3. Despite this pt with hyperammonemia again a year later 4. ? Occult liver dz with the mild LFT elevations? 5. Will get RUQ [place] 6. Lactulose 10g BID for the moment  3. Mild AKI - 1. IVF 2. Repeat CMP in AM 4. AG met acidosis - 1. Repeat CMP 2. Suspect this was lactic acidosis secondary to 1h seizure on presentation 3. Will check lactate now (but hopefully cleared by this point). 5. Crouzon syndrome, CP with quadriparesis - 1. Chronic and baseline 6. PEG tube dependent - 1. Tube feeds per nutrition consult  DVT prophylaxis: Lovenox Code Status: Full Family Communication: Mother at bedside Disposition Plan: Home after admission, mental status improvement hopefully Consults called: Dr. Rory Percy Admission status: Admit to inpatient  Severity of Illness: The appropriate patient status for this patient is INPATIENT. Inpatient status is judged to be reasonable and necessary in order to provide the required intensity of service to ensure the patient's safety. The patient's presenting symptoms, physical exam findings, and initial radiographic and laboratory data in the context of their chronic comorbidities is felt to place them at high risk for further clinical deterioration. Furthermore, it is not anticipated that the patient will be medically stable for discharge from the  hospital within 2 midnights of admission. The following factors support the patient status of inpatient.   IP status due to acute metabolic encephalopathy related to post-ictal state.  Need for work up of hyperammonemia.  * I certify that at the point of admission it is my clinical judgment that the patient will require inpatient hospital care spanning beyond 2 midnights from the point of admission due to high intensity of service, high risk for further deterioration and high frequency of surveillance required.*    Alyssa Wong M. DO [facility]  How to contact the [facility] Attending or Consulting provider [place] or covering provider during after hours [place], for this patient?  1. Check the care team in [facility] and look for a) attending/consulting [facility] provider listed and b) the [facility] team listed 2. Log into [email or web address]  Amion Physician Scheduling and messaging for groups and whole hospitals  On call and physician scheduling software for group practices, residents, hospitalists and other medical providers for call, clinic, rotation and shift schedules. On[employer] is a hospital-wide system for scheduling doctors and paging doctors on call. EasyPlot is for scientific plotting and data analysis.  [email or web address]  and use [place]'s universal password to access. If you do not have the password, please contact the hospital operator.  3. Locate the [facility] provider you are looking for under [facility] and page to a number that you can be directly reached. 4. If you still have difficulty reaching the provider, please page the [facility] (Director on Call) for the Hospitalists listed on amion for assistance.  03/28/2021, 3:36 AM

## 2021-03-28 NOTE — Procedures (Signed)
Patient Name: Alyssa Wong  MRN: [id number]  Epilepsy Attending: Charlsie Quest  Referring Physician/Provider: Dr Esaw Grandchild Date: 03/28/2021 Duration: 28.02 mins  Patient history: 30yo F with epilepsy presented with breakthrough seizure. EEG to evaluate for seizure.  Level of alertness: lethargic  AEDs during EEG study: LTG, LEV  Technical aspects: This EEG study was done with scalp electrodes positioned according to the 10-20 International system of electrode placement. Electrical activity was acquired at a sampling rate of 500Hz  and reviewed with a high frequency filter of 70Hz  and a low frequency filter of 1Hz . EEG data were recorded continuously and digitally stored.   Description: EEG showed continuous generalized 3 to 6 Hz theta-delta slowing with overriding 15-18hz  generalized beta activity.  Hyperventilation and photic stimulation were not performed.     ABNORMALITY - Continuous slow, generalized - Excessive beta, generalized  IMPRESSION: This study is suggestive of moderate diffuse encephalopathy, nonspecific etiology. No seizures or epileptiform discharges were seen throughout the recording.  Kenley Rettinger 

## 2021-03-28 NOTE — Progress Notes (Addendum)
  PROGRESS NOTE    Alyssa Wong  DDU:[id number] DOB: 1991-03-05 DOA: 03/27/2021  PCP: [name], MD    LOS - 0    Patient admitted overnight with persistent breakthrough seizures.  Interval subjective: Pt seen in ED while holding for a bed.  Mother at bedside reports no further seizure activity since here.   Mother reports pt used to be on labetaolol for BP but not recently as BP had been controlled without it.  Exam: Pt non-verbal, does not respond, sleeping comfortably.  Limbs contracted.  No peripheral edema. 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 Julian Reil in detail, and I agree with the assessment and plan as outlined therein. In addition:  --EEG ordered per neuro recs --PRN labetaolol for uncontrolled BP, high diastolics --lower IV fluid rate to 75 cc/hr given high BP's, keep on maintenance for now until taking PO   Case discussed with  Neurosugery, given CT finding with concern for VP shunt 'fracture'.  No need for further evaluation or intervention.  They reviewed CT and compared with prior imaging.  No fluid collection seen in area of reported shunt damage, and R enlargement of ventricle is seen on and off in prior images as well.  Neurosurgery does not feel shunt malfunction has contributed to patient's presentation with breakthrough seizures.   No Charge    Pennie Banter, DO [facility]   If 7PM-7AM, please contact night-coverage [email or web address] 03/28/2021, 12:56 PM

## 2021-03-29 DIAGNOSIS — R569 Unspecified convulsions: Secondary | ICD-10-CM | POA: Diagnosis not present

## 2021-03-29 LAB — CBC
HCT: 45.9 % (ref 36.0–46.0)
Hemoglobin: 15 g/dL (ref 12.0–15.0)
MCH: 27.7 pg (ref 26.0–34.0)
MCHC: 32.7 g/dL (ref 30.0–36.0)
MCV: 84.8 fL (ref 80.0–100.0)
Platelets: 213 10*3/uL (ref 150–400)
RBC: 5.41 MIL/uL — ABNORMAL HIGH (ref 3.87–5.11)
RDW: 12.6 % (ref 11.5–15.5)
WBC: 9.6 10*3/uL (ref 4.0–10.5)
nRBC: 0 % (ref 0.0–0.2)

## 2021-03-29 LAB — COMPREHENSIVE METABOLIC PANEL
ALT: 85 U/L — ABNORMAL HIGH (ref 0–44)
AST: 45 U/L — ABNORMAL HIGH (ref 15–41)
Albumin: 3.1 g/dL — ABNORMAL LOW (ref 3.5–5.0)
Alkaline Phosphatase: 42 U/L (ref 38–126)
Anion gap: 11 (ref 5–15)
BUN: 14 mg/dL (ref 6–20)
CO2: 22 mmol/L (ref 22–32)
Calcium: 8.1 mg/dL — ABNORMAL LOW (ref 8.9–10.3)
Chloride: 101 mmol/L (ref 98–111)
Creatinine, Ser: 0.69 mg/dL (ref 0.44–1.00)
GFR, Estimated: >60 mL/min (ref >60)
Glucose, Bld: 62 mg/dL — ABNORMAL LOW (ref 70–99)
Potassium: 4.2 mmol/L (ref 3.5–5.1)
Sodium: 134 mmol/L — ABNORMAL LOW (ref 135–145)
Total Bilirubin: 1.2 mg/dL (ref 0.3–1.2)
Total Protein: 5.9 g/dL — ABNORMAL LOW (ref 6.5–8.1)

## 2021-03-29 LAB — AMMONIA: Ammonia: 28 umol/L (ref 9–35)

## 2021-03-29 LAB — URINE CULTURE

## 2021-03-29 LAB — GLUCOSE, CAPILLARY
Glucose-Capillary: 64 mg/dL — ABNORMAL LOW (ref 70–99)
Glucose-Capillary: 75 mg/dL (ref 70–99)

## 2021-03-29 MED ORDER — LABETALOL HCL 100 MG PO TABS: 100.0000 mg | ORAL_TABLET | Freq: Two times a day (BID) | ORAL | 1 refills | Status: DC

## 2021-03-29 MED ORDER — LABETALOL HCL 100 MG PO TABS: 100.0000 mg | ORAL_TABLET | Freq: Two times a day (BID) | ORAL | Status: DC

## 2021-03-29 MED ORDER — KETOTIFEN FUMARATE 0.025 % OP SOLN: 1.0000 [drp] | Freq: Two times a day (BID) | OPHTHALMIC | 0 refills | Status: DC

## 2021-03-29 MED ORDER — OSMOLITE 1.2 CAL PO LIQD: 355.0000 mL | Freq: Four times a day (QID) | ORAL | Status: DC

## 2021-03-29 MED ORDER — LAMOTRIGINE 25 MG PO TABS: 75.0000 mg | ORAL_TABLET | Freq: Two times a day (BID) | ORAL | 0 refills | Status: DC

## 2021-03-29 MED ORDER — DEXTROSE 50 % IV SOLN: 1.0000 | INTRAVENOUS | Status: DC | PRN

## 2021-03-29 MED ORDER — KETOTIFEN FUMARATE 0.025 % OP SOLN
1.0000 [drp] | Freq: Two times a day (BID) | OPHTHALMIC | Status: DC
  Filled 2021-03-29: qty 5

## 2021-03-29 NOTE — Progress Notes (Signed)
Initial Nutrition Assessment  DOCUMENTATION CODES:   Not applicable  INTERVENTION:  Provide bolus TF via PEG: -Bolus 1.5 cartons ( ) Osmolite 1.2 QID, flush with 81ml free water before and after each bolus  TF provides 1710 kcals, 79g protein, free water ( total free water with flushes) Meets 100% estimated kcals/protein needs  Note free water flushes may need to be increased if pt is taken off of IVF (currently receiving normal saline @ 14ml/hr)   NUTRITION DIAGNOSIS:   Inadequate oral intake related to inability to eat as evidenced by NPO status,other (comment) (dependent on tube feeding via PEG).  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 At baseline, pt is non-verbal, requires TF via PEG, and is ambulatory with 2 person assist.  Per Neurology, it is possible that the increased seizures are due to shunt malfunction; Neurosurgery has been consulted to assess pt.    RN in room at time of RD visit.   Confirmed home TF regimen with pt's mother -- Osmolite 1.2 x 6 cans per day. TF provides 1710 kcals, 79g protein, free water; meets 100% estimated kcals/protein needs. Pt has had no issues with tolerance. Pt's mother flushes with 72ml free water after each bolus at home. Pt's mother states that pt tolerated 1.5 can QID administration during last hospitalization in May and is agreeable to this being ordered while pt is admitted. Please note it is  documented in LDAs that pt has a PEG-J, but confirmed with pt's mother and with exam that pt has a PEG, no J port. Corrected this in LDAs.   Reviewed weight history. Pt with gradual weight gain over the last year. Pt's mother confirmed gradual weight gain.   UOP: documented x24 hours I/O: 2,52ml since admit  Medications: lactulose IVF: NS @ 25ml/hr Labs: Na 134 (L) CBGs 64-93  NUTRITION - FOCUSED PHYSICAL EXAM:  Flowsheet Row Most Recent Value  Orbital Region No depletion  Upper Arm Region No depletion  Thoracic and Lumbar Region No depletion  Buccal Region No depletion  Temple Region No depletion  Clavicle Bone Region No depletion  Clavicle and Acromion Bone Region No depletion  Scapular Bone Region No depletion  Dorsal Hand No depletion  Patellar Region Mild depletion  Anterior Thigh Region Mild depletion  Posterior Calf Region Mild depletion  Edema (RD Assessment) None  Hair Reviewed  Eyes Reviewed  Mouth Reviewed  Skin Reviewed  Nails Reviewed       Diet Order:   Diet Order            Diet NPO time specified  Diet effective now                 EDUCATION NEEDS:   Not appropriate for education at this time  Skin:  Skin Assessment: Reviewed RN Assessment  Last BM:  5/3  Height:   Ht Readings from Last 1 Encounters:  03/28/21 5\' 2"  (1.575 m)    Weight:   Wt Readings from Last 1 Encounters:  03/28/21 50.2  kg    BMI:  Body mass index is 20.24 kg/m.  Estimated Nutritional Needs:   Kcal:  1500-1750  Protein:  75-90 grams  Fluid:  >1.5 L/d    Eugene Gavia, MS, RD, LDN RD pager number and weekend/on-call pager number located in Amion.

## 2021-03-29 NOTE — Progress Notes (Signed)
Subjective: Mother at bedside. Denies any seizures overnight. Reports redness in left eye.   ROS: Unable to obtain as patient non verbal  Examination  Vital signs in last 24 hours: Temp:  [97.3 F (36.3 C)-98.4 F (36.9 C)] 98.4 F (36.9 C) (05/05 0713) Pulse Rate:  [100-124] 100 (05/04 2056) Resp:  [13-34] 19 (05/05 0713) BP: (119-175)/(82-130) 165/112 (05/05 0713) SpO2:  [99 %-100 %] 99 % (05/05 0713)  General: lying in bed, NAD CVS: pulse-normal rate and rhythm RS: breathing comfortably Extremities: normal  Neuro: awake, doesn't make eye contact with examiner, doesn't follow commands, moving extremities spontaneously in bed, at baseline per mother   Basic Metabolic Panel: Recent Labs  Lab 03/27/21 2150 03/27/21 2219 03/28/21 0657 03/29/21 0447  NA 133* 134* 134* 134*  K 4.7 4.4 4.0 4.2  CL 95*  --  102 101  CO2 19*  --  27 22  GLUCOSE 160*  --  71 62*  BUN 21*  --  15 14  CREATININE 1.24*  --  0.58 0.69  CALCIUM 8.7*  --  8.0* 8.1*  MG 3.2*  --   --   --     CBC: Recent Labs  Lab 03/27/21 2150 03/27/21 2219 03/28/21 0657 03/29/21 0447  WBC 16.7*  --  14.5* 9.6  HGB 17.2* 18.7* 15.6* 15.0  HCT 53.0* 55.0* 48.3* 45.9  MCV 85.9  --  84.7 84.8  PLT 321  --  243 213     Coagulation Studies: No results for input(s): LABPROT, INR in the last 72 hours.  Imaging [facility] 03/27/2021: Right area of right temporoparietal and occipital cortical hypoattenuation, also age findings could be seen in the setting of active seizure, some underlying ischemia may be present as well. Right parietal approach shunt catheter. Shifting caliber of the ventricles including dilatation of the right lateral ventricle when compared to prior. Correlate for features of hydrocephalus. Increasing left periorbital soft tissue swelling and inflammatory changes on a background of marked exophthalmos. Numerous prior craniotomies and reconstructive changes along the right sphenoid wing. Sclerotic  changes of the maxilla likely related to chronically impacted dentition.    DG skull 03/28/2021: Heavy dystrophic calcification of the shunt catheter in the right neck with catheter discontinuity.   ASSESSMENT AND PLAN: 30 year old female with Crouzon syndrome, intractable epilepsy who presented with increased frequency of breakthrough seizures.  Epilepsy with breakthrough seizures Hyperammonemia -No evidence of infection, patient mom states patient was taking her medications, no significant metabolic abnormalities except ammonia elevated at 86 -Routine EEG did not show any ictal-interictal abnormality  Recommendations -Continue Keppra and lamotrigine.  Levels ordered and pending -Neurosurgery team reviewed the imaging and compared them to previous images.  Per neurosurgery, the right side ventricular enlargement has been seen on and off on prior images. No acute issues with the shunt and no plans for evaluation or intervention. -On lactulose for hyperammonemia, defer further work-up to primary team. -Continue seizure precautions -Continue follow-up with pediatric neurology  Thank you for allowing [place] to participate in the care of this patient. Neuro will sign off. Please call us for any questions.   I have spent a total of 25 minutes with the patient reviewing hospital notes,  test results, labs and examining the patient as well as establishing an assessment and plan. > 50% of time was spent in direct patient care.    Lindie Spruce Epilepsy [facility] For questions after 5pm please refer to AMION to reach the Neurologist on call

## 2021-03-29 NOTE — TOC Transition Note (Signed)
Transition of Care [facility]) - CM/SW Discharge Note   Patient Details  Name: Alyssa Wong MRN: [id number] Date of Birth: [birth year]  [facility]) CM/SW Contact:  Kermit Balo, RN Phone Number: 03/29/2021, 1:20 PM   Clinical Narrative:    Patient is discharging home with self care per her mom. Mom states she has no needs at home. She will arrange transport home.   Final next level of care: Home/Self Care Barriers to Discharge: No Barriers Identified   Patient Goals and CMS Choice        Discharge Placement                       Discharge Plan and Services                                     Social Determinants of Health (SDOH) Interventions     Readmission Risk Interventions No flowsheet data found.

## 2021-03-29 NOTE — Discharge Summary (Signed)
Physician Discharge Summary  Alyssa Wong UJW:[id number] DOB: 1991-01-25 DOA: 03/27/2021  PCP: [name], MD  Admit date: 03/27/2021 Discharge date: 04/11/2021  Admitted From: home Disposition:  home  Recommendations for Outpatient Follow-up:  1. Follow up with PCP in 1-2 weeks 2. Please obtain BMP/CBC in one week 3. Please follow up on patient's left eye (had some redness / irritation treated with antihistamine eye drops.  4. Follow up with neurologist as scheduled. Call if need to be seen sooner.  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 female past medical history of Crouzon syndrome, intellectual delay, seizures, cerebral palsy with spastic quadriparesis, status post VP shunt, currently on Keppra and Lamictal, nonverbal at baseline, presented to the emergency room for increasing frequency of tonic-clonic seizures that she said that she had about 1 hour 20 minutes of on and off multiple seizures.    Pt at baseline is non-verbal, uses peg tube for all PO intake, can ambulate with 2 person assist.  Typically has no more than 1-2 seizures a month, but has had increasing frequency of seizures recently.  Today had 1h of off and on seizures.  This prompted mother to bring her in to ED.  Patient admitted for further evaluation and management of breakthrough epileptic seizures and hyperammonemia.    EEG showed only moderate diffuse encephalopathy, nonspecific.  No seizure activity was seen.  Neurology was consulted,  Recommendations: -Continue Keppra and lamotrigine.   -Levels ordered and pending and will be followed up -Lactulose for hyperammonemia -Continue seizure precautions -Continue follow-up with pediatric neurology  Regarding patient's VP shunt: Neurosurgeon reviewed imaging in detail and compared to patient's prior imaging.  The VP shunt is felt functioning satisfactorily at this time, with some of the changes noted including Right lateral ventricle enlargement being present at times previously as well.  No intervention required.     Discharge Instructions   Discharge Instructions    Call MD for:   Complete by: As directed    Recurrent seizures   Call MD for:  extreme fatigue   Complete by: As directed    Call MD for:  persistant nausea and vomiting   Complete by: As directed    Call MD for:  severe uncontrolled pain   Complete by: As directed    Call MD for:  temperature >100.4   Complete by: As directed    Diet - low sodium heart healthy   Complete by: As directed    Discharge instructions   Complete by: As directed    We started Avis on labetalol for her blood pressure since it was running high in the hospital.    I sent prescription for antihistamine eye drops.  You may also use over the counter drops, like Visine-A (for allergies). If the eye becomes more watery, or has thicker pus-looking discharge from it, or if it is not getting better in next couple of days, please call primary care doctor to be seen or have an antibiotic eye drop called in.  Right now, it does not appear to be infected, only irritated.     Neurology did not make any changes to seizure prevention medications (Keppra and Lamictal).   When the levels return from the lab, they will be in contact with you if they were abnormal, in which case doses of medications might need to be changed.   Increase activity slowly   Complete by: As directed      Allergies as of 03/29/2021   No Known Allergies      Medication List    STOP taking these medications   lamoTRIgine 25 MG Chew chewable tablet Commonly known as: LAMICTAL Replaced by: lamoTRIgine 25 MG tablet     TAKE these medications   glycopyrrolate 2 MG tablet Commonly known as: ROBINUL Take 1.5 tablets (3 mg total) by mouth 2 (two) times daily.   ketotifen 0.025 % ophthalmic solution Commonly known as: ZADITOR Place 1 drop into the left eye 2 (two) times daily.   labetalol 100 MG tablet Commonly known as: NORMODYNE Place 1 tablet (100 mg total) into feeding tube 2 (two) times daily.   lactulose 10 GM/15ML solution Commonly known as: CHRONULAC Take 3.4 g by mouth 2 (two) times daily. Take 61mL by mouth twice daily as needed for constipation   lamoTRIgine 25 MG tablet Commonly known as: LAMICTAL Place 3 tablets (75 mg total) into feeding tube 2 (two) times daily. Replaces: lamoTRIgine 25 MG Chew chewable tablet   levETIRAcetam 100 MG/ML solution Commonly known as: KEPPRA Place 10 mLs (1,000 mg total) into feeding tube 2 (two) times daily.   levonorgestrel-ethinyl estradiol 0.15-0.03 MG tablet Commonly known as: SEASONALE Take 1 tablet by mouth daily.       No Known Allergies   If you experience worsening of your admission symptoms, develop shortness of breath, life threatening emergency, suicidal or homicidal thoughts you must seek medical attention immediately by calling 911 or calling your MD immediately  if symptoms less severe.    Please note   You were cared for by a hospitalist during your hospital stay. If you have any questions about your discharge medications or the care you received while you were in the hospital after you are discharged, you can call the unit and asked to speak with the hospitalist on call if the hospitalist that took care of you is not available. Once you are discharged, your primary care physician will handle any further medical issues. Please note that NO REFILLS for any discharge  medications will be authorized once you are discharged, as it is imperative that you return to your primary care physician (or establish a relationship with a primary care physician if you do not have one) for your aftercare needs so that they can reassess your need for medications and monitor your lab values.   Consultations:  Neurology   Procedures/Studies: DG Skull 1-3 Views  Result Date: 03/28/2021 CLINICAL DATA:  Shunt malfunction EXAM: SKULL - 1-3 VIEW COMPARISON:  Head CT from yesterday FINDINGS: Right parietal approach shunt catheter which barely crosses the midline and presumably communicates with the ventricular system via porencephalic cyst when correlated with priors. Heavily calcified shunt tubing in the right neck with redemonstrated fracture just above the level of the clavicle. No acute osseous finding. IMPRESSION: Heavy dystrophic calcification of the shunt catheter in the right neck with catheter discontinuity. Electronically Signed   By: Marnee Spring M.D.   On: 03/28/2021 04:10   DG Chest 1 View  Result Date: 03/28/2021 CLINICAL  DATA:  Shunt malfunction EXAM: CHEST  1 VIEW COMPARISON:  Yesterday FINDINGS: Limited by artifact from EKG leads. Single shunt catheter traversing the right chest showing heavy dystrophic calcification with fracture at the level of the clavicle. Normal heart size and stable symmetric lung aeration. No visible effusion or pneumothorax. IMPRESSION: Shunt catheter traversing the right chest with heavy calcification and fracture at the level of the clavicle. Electronically Signed   By: Marnee Spring M.D.   On: 03/28/2021 04:05   DG Abd 1 View  Result Date: 03/28/2021 CLINICAL DATA:  Shunt malfunction EXAM: ABDOMEN - 1 VIEW COMPARISON:  07/06/2006 FINDINGS: Shunt catheter loops through the abdomen with tip likely in the pelvis. No visible catheter discontinuity or intra-abdominal mass effect Limited by underpenetration. Prominent rectal stool distention. No  generalized obstructive pattern. Postoperative hips. IMPRESSION: Limited study due to underpenetration. No detected abnormality associated with the intra-abdominal shunt. Electronically Signed   By: Marnee Spring M.D.   On: 03/28/2021 04:07   CT Head Wo Contrast  Addendum Date: 03/27/2021   ADDENDUM REPORT: 03/27/2021 22:47 ADDENDUM: Critical Value/emergent results were called by telephone at the time of physician contact on 03/27/2021 at 10:47 pm to provider JON KNAPP , who verbally acknowledged these results. Electronically Signed   By: Kreg Shropshire M.D.   On: 03/27/2021 22:47   Result Date: 03/27/2021 CLINICAL DATA:  Tonic colonic seizure for 1 hour in 20 minutes, intermittently approximately 45 seconds apart EXAM: CT HEAD WITHOUT CONTRAST TECHNIQUE: Contiguous axial images were obtained from the base of the skull through the vertex without intravenous contrast. COMPARISON:  CT head 03/26/2020 FINDINGS: Brain: Parietal approach shunt catheter tip terminates near the superior midline approximating the falx. There is a shifting ventricular configuration with slight dilatation of the right lateral ventricles and more effacement of the left lateral ventricle. Slightly increased caliber of the third ventricle is well. Fourth ventricle is unchanged. Some increasingly conspicuous cortical hypoattenuation is seen across the right temporoparietal region as well as portion of the anterior mesial occipital lobe (4/26, 5/25). No hyperdense hemorrhage. No other mass effect or midline shift. Vascular: No hyperdense vessel or unexpected calcification. Skull: Evidence of multiple prior craniotomies. Overlying scalp thickening and scarring is unchanged from comparison. No acute fracture or conspicuous osseous lesions seen. Sinuses/Orbits: Marked exophthalmos. Mild soft tissue thickening across the left periorbital soft tissues. Thickening in the paranasal sinuses. No layering air-fluid levels or pneumatized secretions. Stable  radiodensities noted in the frontal sinus outflow tract. Discontinuity of the posterior septum. Other: Impacted dentition in the maxilla. Reconstructive changes of the right sphenoid wing IMPRESSION: 1. Right area of right temporoparietal and occipital cortical hypoattenuation, also age findings could be seen in the setting of active seizure, some underlying ischemia may be present as well. 2. Right parietal approach shunt catheter. Shifting caliber of the ventricles including dilatation of the right lateral ventricle when compared to prior. Correlate for features of hydrocephalus. 3. Increasing left periorbital soft tissue swelling and inflammatory changes on a background of marked exophthalmos. 4. Numerous prior craniotomies and reconstructive changes along the right sphenoid wing. 5. Sclerotic changes of the maxilla likely related to chronically impacted dentition. Currently attempting to contact the ordering provider with a critical value result. Addendum will be submitted upon case discussion. Electronically Signed: By: Kreg Shropshire M.D. On: 03/27/2021 22:37   DG Chest Portable 1 View  Result Date: 03/27/2021 CLINICAL DATA:  Seizures EXAM: PORTABLE CHEST 1 VIEW COMPARISON:  None. FINDINGS: Lungs are clear. No  pneumothorax or pleural effusion. Cardiac size within normal limits. No acute bone abnormality on this rotated examination. Widening of the right paratracheal stripe likely relates to rotation. IMPRESSION: Widening of the right paratracheal stripe likely relates to rotation. This could be confirmed with repeat imaging with attention to patient positioning. No definite acute cardiopulmonary disease. Electronically Signed   By: Helyn NumbersAshesh  Parikh MD   On: 03/27/2021 22:14   EEG adult  Result Date: 03/28/2021 Charlsie QuestYadav, Priyanka O, MD     03/28/2021  5:50 PM Patient Name: Alyssa Wong MRN: [id number] Epilepsy Attending: Charlsie QuestPriyanka O Yadav Referring Physician/Provider: Dr Esaw GrandchildKelly Lamere Lightner Date: 03/28/2021 Duration:  28.02 mins Patient history: 30yo F with epilepsy presented with breakthrough seizure. EEG to evaluate for seizure. Level of alertness: lethargic AEDs during EEG study: LTG, LEV Technical aspects: This EEG study was done with scalp electrodes positioned according to the 10-20 International system of electrode placement. Electrical activity was acquired at a sampling rate of 500Hz  and reviewed with a high frequency filter of 70Hz  and a low frequency filter of 1Hz . EEG data were recorded continuously and digitally stored. Description: EEG showed continuous generalized 3 to 6 Hz theta-delta slowing with overriding 15-18hz  generalized beta activity.  Hyperventilation and photic stimulation were not performed.   ABNORMALITY - Continuous slow, generalized - Excessive beta, generalized IMPRESSION: This study is suggestive of moderate diffuse encephalopathy, nonspecific etiology. No seizures or epileptiform discharges were seen throughout the recording. Priyanka Annabelle Harman Yadav   US Abdomen Limited RUQ (LIVER/GB)  Result Date: 03/28/2021 CLINICAL DATA:  Hyperammonemia EXAM: ULTRASOUND ABDOMEN LIMITED RIGHT UPPER QUADRANT COMPARISON:  None. FINDINGS: Gallbladder: No gallstones or wall thickening visualized. No sonographic Murphy sign noted by sonographer. Common bile duct: Diameter: 3 mm Liver: No focal lesion identified. Within normal limits in parenchymal echogenicity. Portal vein is patent on color Doppler imaging with normal direction of blood flow towards the liver. Other: None. IMPRESSION: Negative right upper quadrant ultrasound. Electronically Signed   By: Marnee SpringJonathon  Watts M.D.   On: 03/28/2021 04:33       Subjective: Pt seen with mother at bedside.  No further seizure activity or other issues.  Pt non-verbal, does not provide history.  Appears in no distress.  No acute events reported by staff.    Discharge Exam: Vitals:   03/29/21 0926 03/29/21 1122  BP: (!) 134/96 (!) 142/107  Pulse:    Resp: 20 20  Temp:   98.5 F (36.9 C)  SpO2:  100%   Vitals:   03/29/21 0426 03/29/21 0713 03/29/21 0926 03/29/21 1122  BP: 119/83 (!) 165/112 (!) 134/96 (!) 142/107  Pulse:      Resp: 19 19 20 20   Temp: 98.2 F (36.8 C) 98.4 F (36.9 C)  98.5 F (36.9 C)  TempSrc: Axillary Axillary  Axillary  SpO2: 100% 99%  100%  Weight:      Height:        General: Pt is alert, awake, not in acute distress Cardiovascular: RRR, S1/S2 +, no rubs, no gallops Respiratory: CTA bilaterally, no wheezing, no rhonchi Abdominal: Soft, NT, ND, bowel sounds + Extremities: contracted extremities appear stable, no cyanosis    The results of significant diagnostics from this hospitalization (including imaging, microbiology, ancillary and laboratory) are listed below for reference.     Microbiology: No results found for this or any previous visit (from the past 240 hour(s)).   Labs: BNP (last 3 results) No results for input(s): BNP in the last 8760 hours. Basic Metabolic Panel:  No results for input(s): NA, K, CL, CO2, GLUCOSE, BUN, CREATININE, CALCIUM, MG, PHOS in the last 168 hours. Liver Function Tests: No results for input(s): AST, ALT, ALKPHOS, BILITOT, PROT, ALBUMIN in the last 168 hours. No results for input(s): LIPASE, AMYLASE in the last 168 hours. No results for input(s): AMMONIA in the last 168 hours. CBC: No results for input(s): WBC, NEUTROABS, HGB, HCT, MCV, PLT in the last 168 hours. Cardiac Enzymes: No results for input(s): CKTOTAL, CKMB, CKMBINDEX, TROPONINI in the last 168 hours. BNP: Invalid input(s): POCBNP CBG: No results for input(s): GLUCAP in the last 168 hours. D-Dimer No results for input(s): DDIMER in the last 72 hours. Hgb A1c No results for input(s): HGBA1C in the last 72 hours. Lipid Profile No results for input(s): CHOL, HDL, LDLCALC, TRIG, CHOLHDL, LDLDIRECT in the last 72 hours. Thyroid function studies No results for input(s): TSH, T4TOTAL, T3FREE, THYROIDAB in the last 72  hours.  Invalid input(s): FREET3 Anemia work up No results for input(s): VITAMINB12, FOLATE, FERRITIN, TIBC, IRON, RETICCTPCT in the last 72 hours. Urinalysis    Component Value Date/Time   COLORURINE YELLOW 03/28/2021 0259   APPEARANCEUR CLOUDY (A) 03/28/2021 0259   LABSPEC 1.009 03/28/2021 0259   PHURINE 9.0 (H) 03/28/2021 0259   GLUCOSEU NEGATIVE 03/28/2021 0259   HGBUR MODERATE (A) 03/28/2021 0259   BILIRUBINUR NEGATIVE 03/28/2021 0259   KETONESUR NEGATIVE 03/28/2021 0259   PROTEINUR NEGATIVE 03/28/2021 0259   NITRITE NEGATIVE 03/28/2021 0259   LEUKOCYTESUR TRACE (A) 03/28/2021 0259   Sepsis Labs Invalid input(s): PROCALCITONIN,  WBC,  LACTICIDVEN Microbiology No results found for this or any previous visit (from the past 240 hour(s)).   Time coordinating discharge: Over 30 minutes  SIGNED:   Pennie Banter, DO [facility] 04/11/2021, 8:08 PM   If 7PM-7AM, please contact night-coverage [email or web address]

## 2021-03-30 LAB — LEVETIRACETAM LEVEL: Levetiracetam Lvl: 16.6 ug/mL (ref 10.0–40.0)

## 2021-04-01 ENCOUNTER — Encounter (INDEPENDENT_AMBULATORY_CARE_PROVIDER_SITE_OTHER): Payer: Self-pay

## 2021-04-11 DIAGNOSIS — N179 Acute kidney failure, unspecified: Secondary | ICD-10-CM | POA: Diagnosis present

## 2021-04-16 ENCOUNTER — Encounter (INDEPENDENT_AMBULATORY_CARE_PROVIDER_SITE_OTHER): Payer: Self-pay | Admitting: Family

## 2021-04-16 ENCOUNTER — Ambulatory Visit (INDEPENDENT_AMBULATORY_CARE_PROVIDER_SITE_OTHER): Payer: Medicaid Other | Admitting: Family

## 2021-04-16 ENCOUNTER — Other Ambulatory Visit: Payer: Self-pay

## 2021-04-16 VITALS — BP 122/80 | HR 104 | Wt 109.8 lb

## 2021-04-16 DIAGNOSIS — G800 Spastic quadriplegic cerebral palsy: Secondary | ICD-10-CM | POA: Diagnosis not present

## 2021-04-16 DIAGNOSIS — Q751 Craniofacial dysostosis: Secondary | ICD-10-CM

## 2021-04-16 DIAGNOSIS — Z931 Gastrostomy status: Secondary | ICD-10-CM

## 2021-04-16 DIAGNOSIS — M24562 Contracture, left knee: Secondary | ICD-10-CM

## 2021-04-16 DIAGNOSIS — F819 Developmental disorder of scholastic skills, unspecified: Secondary | ICD-10-CM

## 2021-04-16 DIAGNOSIS — Z79899 Other long term (current) drug therapy: Secondary | ICD-10-CM

## 2021-04-16 DIAGNOSIS — Z982 Presence of cerebrospinal fluid drainage device: Secondary | ICD-10-CM

## 2021-04-16 DIAGNOSIS — E7220 Disorder of urea cycle metabolism, unspecified: Secondary | ICD-10-CM

## 2021-04-16 DIAGNOSIS — M24561 Contracture, right knee: Secondary | ICD-10-CM

## 2021-04-16 DIAGNOSIS — G40309 Generalized idiopathic epilepsy and epileptic syndromes, not intractable, without status epilepticus: Secondary | ICD-10-CM

## 2021-04-16 DIAGNOSIS — Q743 Arthrogryposis multiplex congenita: Secondary | ICD-10-CM

## 2021-04-16 DIAGNOSIS — K117 Disturbances of salivary secretion: Secondary | ICD-10-CM

## 2021-04-16 DIAGNOSIS — K148 Other diseases of tongue: Secondary | ICD-10-CM

## 2021-04-16 MED ORDER — LAMOTRIGINE 25 MG PO TABS: 75.0000 mg | ORAL_TABLET | Freq: Two times a day (BID) | ORAL | 5 refills | Status: DC

## 2021-04-16 NOTE — Progress Notes (Signed)
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 She is taking and tolerating Lamotrigine andLevetiracetamfor seziures. She has dysphagia and requires feedings by g-tube, with Osmolyte as her formula.  Today's concerns: May is seen today because of hospitalization earlier this month for prolonged seizure lasting 1 hour 20 minutes. She does not have abortive medication as her seizures tend to be brief when they occur. Upon admission she was also noted to have elevated ammonia levels. She had a similar event 1 year ago.   Mom reports in that in the week prior to the seizure, Alyssa Wong had respiratory congestion and was given a 5 day course of Prednisone by her PCP. She tolerated that well and the seizure occurred the day after the Prednisone ended. During hospitalization, Alyssa Wong was evaluated by neurosurgery and her shunt was found to be functioning normally. The seizures stopped after admission to ER and did not require further intervention. When her ammonia level normalized she was discharged with plans to follow up at this office.   Mom reports that Alyssa Wong has remained seizure free since discharge from the hospital. She has been tolerating her feedings and sleeping well. Mom has noted that over time Alyssa Wong's tongue has been protruding more, and she is concerned about her airway. Montel Clock used to see Pediatric ENT at [facility] and had extension craniofacial surgeries as a young child. Mom is interested in finding an adult ENT provider  for [name].   [name] is compliant with medications and typically sleeps well at night. She has been otherwise generally healthy since she was last seen. Mom has no other health concerns for her today other than previously mentioned.  Review of systems: Please see HPI for neurologic and other pertinent review of systems. Otherwise all other systems were reviewed and were negative.  Problem List: Patient Active Problem List   Diagnosis Date Noted  . AKI (acute kidney injury) (HCC) 04/11/2021  . Seizure (HCC) 03/28/2021  . Metabolic encephalopathy 03/27/2020  . SIRS (systemic inflammatory response syndrome) (HCC) 03/26/2020  . Altered mental status 03/26/2020  . Hypertensive urgency 03/26/2020  . Status post insertion of percutaneous endoscopic gastrostomy (PEG) tube (HCC) 03/26/2020  . Hyperammonemia (HCC) 03/26/2020  . Lactic acidosis 03/26/2020  . Loss of weight 07/17/2018  . 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 Drooling 03/04/2017  . 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 Encounter for long-term (current) use of other medications 12/29/2013  . 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 Intellectual delay   . S/P VP shunt   . Arthrogryposis multiplex congenita   . Crouzon syndrome   . Contractures involving both knees      Past Medical History:  Diagnosis Date  . Arthrogryposis multiplex congenita   . Contractures involving both knees   . Crouzon syndrome   . Intellectual delay   . Seizures (HCC)   . 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 ELBOW SURGERY Left    Release of contractures  . GASTROSTOMY TUBE PLACEMENT    . HIP SURGERY       Family history: family history includes Kidney disease in her maternal grandfather.   Social history: Social History   Socioeconomic History  . Marital status: Single    Spouse name: Not on file  . Number of children: Not on file  . Years of education: Not on file  . Highest education level: Not on file  Occupational History  . Not on file  Tobacco Use  . Smoking status: Never Smoker  . Smokeless tobacco: Never Used  Substance and Sexual Activity  . Alcohol use: No  . Drug use: No  . Sexual activity: Never  Other Topics Concern  . Not on file  Social History Narrative   [name] is a young woman who graduated from [employer] with a certificate in 2013. She is enrolled in the day programs [employer] [facility] [employer]. She lives with her mother and she has 3 siblings,  37 yo brother at home   Social Determinants of Corporate investment banker Strain: Not on file  Food Insecurity: Not on file  Transportation Needs: Not on file  Physical Activity: Not on file  Stress: Not on file  Social Connections: Not on file  Intimate Partner Violence: Not on file      Past/failed meds: Copied from previous record: Depakote - elevated ammonia and altered mental status  Allergies: No Known Allergies   Immunizations:  There is no immunization history on file for this patient.    Diagnostics/Screenings: Copied from previous record: 03/26/2020 - CT head wo contrast -1. Unchanged position of right parietal approach shunt catheter with unchanged size and configuration of the ventricles. 2. No acute intracranial abnormality.  07/05/2006 - CT head wo contrast -Study technically limited to patient positioning and inability to straighten head. Right ventricular drain is in place without hydrocephalus. No definiteacute intracranial  abnormality  03/28/2020 - rEEG -This studyshowed evidence of epileptogenicityas well as cortical dysfunction in the right temporoparietal region consistent with underlying craniotomy. Additionally, there is evidence of mild to moderate diffuse encephalopathy, nonspecific etiology but likely related to patient's history of cerebral palsy and intellectual delay.No seizures were seen throughout the recording.  Physical Exam: BP 122/80   Pulse (!) 104   Wt 109 lb 12.8 oz (49.8 kg)   BMI 20.08 kg/m   General: small statured but well developed, well nourished woman, seated in wheelchair, in no evident distress; black hair, brown eyes, right handed Head: doliocephalic and atraumatic. Oropharynx is difficult to examine. She has exophthalmos, ocular hypertelorism, and midface hyperplasia. Her tongue protrudes Neck: neck is hyperextended Cardiovascular: regular rate and rhythm, no murmurs. Respiratory: clear to auscultation bilaterally Abdomen: bowel sounds present all four quadrants, abdomen soft, non-tender, non-distended. No hepatosplenomegaly or masses palpated.Gastrostomy tube in place clean and dry Musculoskeletal: spastic quadriparesis with arthrogryposis in the right upper extremity. Her back is arched and doesn't touch the back of the wheelchair. Skin: no rashes or neurocutaneous lesions  Neurologic Exam Mental Status: awake and fully alert. Has no language. Does not smile responsively. Resistant to invasions into her space. Cranial Nerves: fundoscopic exam - red reflex present.  Unable to fully visualize fundus.  Pupils  equal briskly reactive to light. Sometimes turns to localize faces, objects and sounds in the periphery. Facial movements are asymmetric, has lower facial weakness with drooling. Motor: spastic quadriparesis with arthrogryposis in the right upper extremity. Very poor fine motor movements. Sensory: withdrawal x 4 Coordination: unable to adequately assess due to patient's  inability to participate in examination. Does not reach for objects. Gait and Station: unable to stand and bear weight. 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) - Plan: Ammonia, Comprehensive metabolic panel, Lamotrigine level, Lamotrigine level, Comprehensive metabolic panel, Ammonia, Ambulatory referral to ENT  Contractures involving both knees - Plan: Ammonia, Comprehensive metabolic panel, Lamotrigine level, Lamotrigine level, Comprehensive metabolic panel, Ammonia, Ambulatory referral to ENT  Gastrostomy tube dependent (HCC) - Plan: Ammonia, Comprehensive metabolic panel, Lamotrigine level, Lamotrigine level, Comprehensive metabolic panel, Ammonia, Ambulatory referral to ENT  Intellectual delay - Plan: Ammonia, Comprehensive metabolic panel, Lamotrigine level, Lamotrigine level, Comprehensive metabolic panel, Ammonia, Ambulatory referral to ENT  S/P VP shunt - Plan: Ammonia, Comprehensive metabolic panel, Lamotrigine level, Lamotrigine level, Comprehensive metabolic panel, Ammonia, Ambulatory referral to ENT  Arthrogryposis multiplex congenita - Plan: Ammonia, Comprehensive metabolic panel, Lamotrigine level, Lamotrigine level, Comprehensive metabolic panel, Ammonia, Ambulatory referral to ENT  Encounter for long-term (current) use of high-risk medication - Plan: Ammonia, Comprehensive metabolic panel, Lamotrigine level, Lamotrigine level, Comprehensive metabolic panel,  Ammonia, Ambulatory referral to ENT  Drooling - Plan: Ammonia, Comprehensive metabolic panel, Lamotrigine level, Lamotrigine level, Comprehensive metabolic panel, Ammonia, Ambulatory referral to ENT  Hyperammonemia (HCC) - Plan: Ammonia, Comprehensive metabolic panel, Lamotrigine level, Lamotrigine level, Comprehensive metabolic panel, Ammonia, Ambulatory referral to ENT  Excessive protrusion of tongue - Plan: Ambulatory referral to ENT   Recommendations for plan of care: The patient's previous [facility] records were reviewed. Lota has neither had nor required imaging or lab studies since her recent hospitalization. Her mother is aware of all results. She is a 30 year old woman with history of Crouzon syndrome, generalized convulsive epilepsy, spastic quadriparesis, contractures involving multiple joints, arthrogryposis multiplex congenita, significant intellectual delay, and VP shunt. She is taking and tolerating Lamotrigine and Levetiracetam for her seizure disorder. She was hospitalized earlier this month for a prolonged seizure and for elevated ammonia level. She has recovered well from these problems. I talked with Mom and told her that I will refer her to adult ENT provider for her concerns about the increased protrusion of her tongue. I consulted with Dr Sharene Skeans and will consult genetics about the episodes of hyperammonemia. I will see Wilfred back in follow up in 1 month and will repeat lab studies at that time. Mom agreed with the plans made today.   The medication list was reviewed and reconciled. No changes were made in the prescribed medications today. A complete medication list was provided to the patient.  Orders Placed This Encounter  Procedures  . Ammonia    Standing Status:   Future    Number of Occurrences:   1    Standing Expiration Date:   07/20/2021  . Comprehensive metabolic panel    Standing Status:   Future    Number of Occurrences:  1    Standing Expiration Date:    07/20/2021    Order Specific Question:   Has the patient fasted?    Answer:   No  . Lamotrigine level    Random level    Standing Status:   Future    Number of Occurrences:   1    Standing Expiration Date:   07/20/2021  . Ambulatory referral to ENT    Referral Priority:   Routine    Referral Type:   Consultation    Referral Reason:   Specialty Services Required    Requested Specialty:   Otolaryngology    Number of Visits Requested:   1    Return in about 1 month (around 05/17/2021).   Allergies as of 04/16/2021   No Known Allergies     Medication List       Accurate as of Apr 16, 2021 11:59 PM. If you have any questions, ask your nurse or doctor.        glycopyrrolate 2 MG tablet Commonly known as: ROBINUL Take 1.5 tablets (3 mg total) by mouth 2 (two) times daily.   ketotifen 0.025 % ophthalmic solution Commonly known as: ZADITOR Place 1 drop into the left eye 2 (two) times daily.   labetalol 100 MG tablet Commonly known as: NORMODYNE Place 1 tablet (100 mg total) into feeding tube 2 (two) times daily.   lactulose 10 GM/15ML solution Commonly known as: CHRONULAC Take 3.4 g by mouth 2 (two) times daily. Take 5mL by mouth twice daily as needed for constipation   lamoTRIgine 25 MG tablet Commonly known as: LAMICTAL Place 3 tablets (75 mg total) into feeding tube 2 (two) times daily.   levETIRAcetam 100 MG/ML solution Commonly known as: KEPPRA Place 10 mLs (1,000 mg total) into feeding tube 2 (two) times daily.   levonorgestrel-ethinyl estradiol 0.15-0.03 MG tablet Commonly known as: SEASONALE Take 1 tablet by mouth daily.       I consulted with Dr Sharene SkeansHickling regarding this patient.  Total time spent with the patient was 30 minutes, of which 50% or more was spent in counseling and coordination of care.  Elveria Risingina Alvester Eads NP-C [facility] Ph. [phone] Fax [phone]

## 2021-04-19 ENCOUNTER — Encounter (INDEPENDENT_AMBULATORY_CARE_PROVIDER_SITE_OTHER): Payer: Self-pay | Admitting: Family

## 2021-04-19 DIAGNOSIS — K148 Other diseases of tongue: Secondary | ICD-10-CM | POA: Insufficient documentation

## 2021-04-19 NOTE — Patient Instructions (Addendum)
Thank you for coming in today.   Instructions for you until your next appointment are as follows: 1. Continue Clyda's medications as prescribed. Let me know if her seizures become more frequent or more severe.  2. I will refer Kearia to adult ENT provider.  3. I will talk with Dr Sharene Skeans about her episodes of elevated ammonia levels and see if he has any recommendations 4. Please plan to return in about a month. We will recheck the ammonia level at that time.  5. Please sign up for MyChart if you have not done so.  At [facility] we are committed to providing exceptional care. You will receive a patient satisfaction survey through text or email regarding your visit today. Your opinion is important to me. Comments are appreciated.

## 2021-04-26 ENCOUNTER — Ambulatory Visit (INDEPENDENT_AMBULATORY_CARE_PROVIDER_SITE_OTHER): Payer: Medicaid Other | Admitting: Family

## 2021-05-24 ENCOUNTER — Other Ambulatory Visit: Payer: Self-pay

## 2021-05-24 ENCOUNTER — Ambulatory Visit (INDEPENDENT_AMBULATORY_CARE_PROVIDER_SITE_OTHER): Payer: Medicaid Other | Admitting: Family

## 2021-05-24 ENCOUNTER — Encounter (INDEPENDENT_AMBULATORY_CARE_PROVIDER_SITE_OTHER): Payer: Self-pay | Admitting: Family

## 2021-05-24 VITALS — BP 117/64 | HR 64 | Wt 112.0 lb

## 2021-05-24 DIAGNOSIS — M24562 Contracture, left knee: Secondary | ICD-10-CM

## 2021-05-24 DIAGNOSIS — E7220 Disorder of urea cycle metabolism, unspecified: Secondary | ICD-10-CM

## 2021-05-24 DIAGNOSIS — G800 Spastic quadriplegic cerebral palsy: Secondary | ICD-10-CM | POA: Diagnosis not present

## 2021-05-24 DIAGNOSIS — Q751 Craniofacial dysostosis: Secondary | ICD-10-CM

## 2021-05-24 DIAGNOSIS — Z982 Presence of cerebrospinal fluid drainage device: Secondary | ICD-10-CM

## 2021-05-24 DIAGNOSIS — Z931 Gastrostomy status: Secondary | ICD-10-CM

## 2021-05-24 DIAGNOSIS — K148 Other diseases of tongue: Secondary | ICD-10-CM

## 2021-05-24 DIAGNOSIS — G40309 Generalized idiopathic epilepsy and epileptic syndromes, not intractable, without status epilepticus: Secondary | ICD-10-CM

## 2021-05-24 DIAGNOSIS — Q743 Arthrogryposis multiplex congenita: Secondary | ICD-10-CM

## 2021-05-24 DIAGNOSIS — F819 Developmental disorder of scholastic skills, unspecified: Secondary | ICD-10-CM

## 2021-05-24 DIAGNOSIS — M24561 Contracture, right knee: Secondary | ICD-10-CM

## 2021-05-24 NOTE — Progress Notes (Signed)
Alyssa Wong   MRN:  [id number]  [birth year]   Provider: [name] NP-C Location of Care: 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 She is taking and tolerating Lamotrigine and Levetiracetam for seziures. She has dysphagia and requires feedings by g-tube, with Osmolyte as her formula.  Today's concerns: [name] is seen today in follow up for hospitalization for prolonged seizure and hyperammonemia in May. She has had previous episodes of hyperammonemia. It was originally thought to be related to Depakote but that medication was stopped in May 2021.   Mom is also concerned because [name]'s tongue protrudes more than in the past and edema around her neck. [name] used to be able to take some tastes of food for pleasure but now is no longer able to manipulate the food in her mouth. A referral was made for ENT locally but was denied due to type of insurance coverage.   Mom reports today that Charlea had a brief seizure last week, lasting less then 2 minutes. She has been otherwise generally healthy since she was last seen. Mom has no other health concerns for [name] today other than previously mentioned.  Review of systems: Please see HPI for neurologic and other pertinent review of systems. Otherwise all other systems were reviewed and were negative.  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   Drooling 03/04/2017   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   Encounter for long-term (current) use of high-risk medication 12/29/2013   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)    Intellectual delay    S/P VP shunt    Arthrogryposis multiplex congenita    Crouzon syndrome    Contractures involving both knees      Past Medical History:  Diagnosis Date   Arthrogryposis multiplex congenita    Contractures involving both knees    Crouzon syndrome    Intellectual delay    Seizures (HCC)    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   Social history: Social History   Socioeconomic History   Marital status: Single    Spouse name: Not on file   Number  of children: Not on file   Years of education: Not on file   Highest education level: Not on file  Occupational History   Not on file  Tobacco Use   Smoking status: Never   Smokeless tobacco: Never  Substance and Sexual Activity   Alcohol use: No   Drug use: No   Sexual activity: Never  Other Topics  Concern   Not on file  Social History Narrative   Alyssa Wong is a young woman who graduated from [employer] with a certificate in 2013. She is enrolled in the day programs [employer] [facility] [employer]. She lives with her mother and she has 3 siblings,  64 yo brother at home   Social Determinants of Corporate investment banker Strain: Not on file  Food Insecurity: Not on file  Transportation Needs: Not on file  Physical Activity: Not on file  Stress: Not on file  Social Connections: Not on file  Intimate Partner Violence: Not on file    Past/failed meds: Copied from previous record: Depakote - elevated ammonia and altered mental status   Allergies: No Known Allergies   Immunizations:  There is no immunization history on file for this patient.   Diagnostics/Screenings: Copied from previous record: 03/26/2020 - CT head wo contrast - 1. Unchanged position of right parietal approach shunt catheter with unchanged size and configuration of the ventricles. 2. No acute intracranial abnormality.   07/05/2006 - CT head wo contrast - Study technically limited to patient positioning and inability to straighten head. Right ventricular drain is in place without hydrocephalus. No definite acute intracranial abnormality   03/28/2020 - rEEG - This study showed evidence of epileptogenicity as well as cortical dysfunction in the right temporoparietal region consistent with underlying craniotomy.  Additionally, there is evidence of mild to moderate diffuse encephalopathy, nonspecific etiology but likely related to patient's history of cerebral palsy and intellectual delay. No seizures were seen throughout the recording.  Physical Exam: BP 117/64   Pulse 64   Wt 112 lb (50.8 kg)   BMI 20.49 kg/m   General: small statured but well developed, well nourished woman, seated in wheelchair, in no evident distress; black hair, brown eyes, right handed Head: doliocephalic and atraumatic.  Oropharynx is difficult to examine. She has exophthalmos, ocular hypertelorism and midface hyperplasia. Her tongue protrudes.  Neck: tends to hold in hyperextended position. She has edema in her neck under her cheeks and chin. Cardiovascular: regular rate and rhythm, no murmurs. Respiratory: clear to auscultation bilaterally Abdomen: bowel sounds present all four quadrants, abdomen soft, non-tender, non-distended. No hepatosplenomegaly or masses palpated.Gastrostomy tube in place clean and dry Musculoskeletal: spastic quadriparesis with arthrogryposis in the right upper extremity. Her back is arched.  Skin: no rashes or neurocutaneous lesions  Neurologic Exam Mental Status: awake and fully alert. Has no language. Does not smile responsively. Resistant to invasions in to her space Cranial Nerves: fundoscopic exam - red reflex present.  Unable to fully visualize fundus.  Pupils equal briskly reactive to light. Does not consistently turn to localize faces, objects and sounds in the periphery.Facial movements are asymmetric, has lower facial weakness with drooling.  Motor: spastic quadriparesis with arthrogryposis in the right upper extremity. Very poor fine motor movements.  Sensory: withdrawal x 4 Coordination: unable to adequately assess due to patient's inability to participate in examination. Does not reach for objects. Gait and Station: unable to stand and bear weight.   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 Faige has neither had nor  required imaging or lab studies since the last visit. She is a 30 year old woman with history of Crouzon syndrome, seizures, spastic quadriparesis, intellectual delay, arthrogryposis multiplex congenita, VP shunt, dysphagia requiring gastrostomy tube, protrusion of her tongue and episodes of hyperammonemia. We will recheck lab studies today to see if the ammonia level has normalized since her admission to the hospital in May 2022. I will call Mom when the results are available to me. I will also refer her to [facility] as I am concerned about a metabolic disorder because of the episodes of hyperammonemia. I will refer her to ENT at [facility] because of her tongue protrusion and edema around her neck, and concerns about her airway. I asked Mom to let me know if Kashish has more seizures or if she has any other concerns. I will otherwise see her back in follow up in 3 months or sooner if needed.   The medication list was reviewed and reconciled. No changes were made in the prescribed medications today. A complete medication list was provided to the patient.  Orders Placed This Encounter  Procedures   Ambulatory referral to Genetics    Referral Priority:   Routine    Referral Type:   Consultation    Referral Reason:   Specialty Services Required    Number of Visits Requested:   1    Return in about 3 months (around 08/24/2021).   Allergies as of 05/24/2021   No Known Allergies      Medication List        Accurate as of May 24, 2021 11:59 PM. If you have any questions, ask your nurse or doctor.          glycopyrrolate 2 MG tablet Commonly known as: ROBINUL Take 1.5 tablets (3 mg total) by mouth 2 (two) times daily.   ketotifen 0.025 % ophthalmic solution Commonly known as: ZADITOR Place 1 drop into the left eye 2 (two) times daily.   labetalol 100 MG tablet Commonly known as: NORMODYNE Place 1 tablet (100 mg total) into feeding tube 2 (two) times daily.   lactulose 10 GM/15ML  solution Commonly known as: CHRONULAC Take 3.4 g by mouth 2 (two) times daily. Take 67mL by mouth twice daily as needed for constipation   lamoTRIgine 25 MG tablet Commonly known as: LAMICTAL Place 3 tablets (75 mg total) into feeding tube 2 (two) times daily.   levETIRAcetam 100 MG/ML solution Commonly known as: KEPPRA Place 10 mLs (1,000 mg total) into feeding tube 2 (two) times daily.   levonorgestrel-ethinyl estradiol 0.15-0.03 MG tablet Commonly known as: SEASONALE Take 1 tablet by mouth daily.       Total time spent with the patient was 25 minutes, of which 50% or more was spent in counseling and coordination of care.  [name] NP-C [facility] Ph. [phone] Fax [phone]

## 2021-05-25 ENCOUNTER — Encounter (INDEPENDENT_AMBULATORY_CARE_PROVIDER_SITE_OTHER): Payer: Self-pay | Admitting: Family

## 2021-05-25 LAB — AMMONIA: Ammonia: 75 umol/L — ABNORMAL HIGH (ref <=72)

## 2021-05-25 NOTE — Patient Instructions (Signed)
Thank you for coming in today.   Instructions for you until your next appointment are as follows: We will draw blood today to recheck the ammonia level, as well as check her liver and kidneys. We will check the Lamotrigine level to see if we can adjust the dose. I will call you when I receive the results.  I will refer Alyssa Wong to [facility] for an evaluation regarding the bouts of elevated ammonia levels that she has had recently I will refer Alyssa Wong to an ENT (ear nose and throat) doctor at [facility] in [place] since I have been unable to find an ENT locally to see her.  Let me know if Alyssa Wong has seizures or if you have any concerns.  Please sign up for MyChart if you have not done so. Please plan to return for follow up in 3 months or sooner if needed.  At [facility], we are committed to providing exceptional care. You will receive a patient satisfaction survey through text or email regarding your visit today. Your opinion is important to me. Comments are appreciated.

## 2021-05-26 LAB — COMPREHENSIVE METABOLIC PANEL
AG Ratio: 1.6 (calc) (ref 1.0–2.5)
ALT: 39 U/L — ABNORMAL HIGH (ref 6–29)
AST: 29 U/L (ref 10–30)
Albumin: 4.1 g/dL (ref 3.6–5.1)
Alkaline phosphatase (APISO): 46 U/L (ref 31–125)
BUN: 11 mg/dL (ref 7–25)
CO2: 23 mmol/L (ref 20–32)
Calcium: 9.6 mg/dL (ref 8.6–10.2)
Chloride: 103 mmol/L (ref 98–110)
Creat: 0.56 mg/dL (ref 0.50–1.10)
Globulin: 2.6 g/dL (calc) (ref 1.9–3.7)
Glucose, Bld: 65 mg/dL (ref 65–139)
Potassium: 4.6 mmol/L (ref 3.5–5.3)
Sodium: 136 mmol/L (ref 135–146)
Total Bilirubin: 0.3 mg/dL (ref 0.2–1.2)
Total Protein: 6.7 g/dL (ref 6.1–8.1)

## 2021-05-26 LAB — LAMOTRIGINE LEVEL: Lamotrigine Lvl: 9.7 ug/mL (ref 4.0–18.0)

## 2021-05-28 ENCOUNTER — Other Ambulatory Visit (INDEPENDENT_AMBULATORY_CARE_PROVIDER_SITE_OTHER): Payer: Self-pay | Admitting: Family

## 2021-05-28 DIAGNOSIS — G40309 Generalized idiopathic epilepsy and epileptic syndromes, not intractable, without status epilepticus: Secondary | ICD-10-CM

## 2021-05-29 ENCOUNTER — Encounter (INDEPENDENT_AMBULATORY_CARE_PROVIDER_SITE_OTHER): Payer: Self-pay | Admitting: Family

## 2021-06-14 ENCOUNTER — Telehealth (INDEPENDENT_AMBULATORY_CARE_PROVIDER_SITE_OTHER): Payer: Self-pay | Admitting: Family

## 2021-06-14 NOTE — Telephone Encounter (Signed)
  Who's calling (name and relationship to patient) : Britta Mccreedy at [facility]  Best contact number:  Provider they see: [name]  Reason for call: Britta Mccreedy states that since patient is over the age of 60 she will need to be referred to [facility]. Their phone number is [phone] and their fax number is [phone].    PRESCRIPTION REFILL ONLY  Name of prescription:  Pharmacy:

## 2021-06-20 ENCOUNTER — Other Ambulatory Visit (INDEPENDENT_AMBULATORY_CARE_PROVIDER_SITE_OTHER): Payer: Self-pay | Admitting: Family

## 2021-06-20 DIAGNOSIS — K117 Disturbances of salivary secretion: Secondary | ICD-10-CM

## 2021-06-20 DIAGNOSIS — G800 Spastic quadriplegic cerebral palsy: Secondary | ICD-10-CM

## 2021-07-20 ENCOUNTER — Ambulatory Visit (INDEPENDENT_AMBULATORY_CARE_PROVIDER_SITE_OTHER): Payer: Medicaid Other | Admitting: Family

## 2021-07-23 IMAGING — CT CT HEAD W/O CM
2 of 3 series · 12 of 37 positions shown, 15 images · non-contrast
Comparison: CT head 03/26/2020
COMPARISON: CT head 03/26/2020

Addendum:
CLINICAL DATA: Tonic colonic seizure for 1 hour in 20 minutes,
intermittently approximately 45 seconds apart

EXAM:
CT HEAD WITHOUT CONTRAST
TECHNIQUE: Contiguous axial images were obtained from the base of the skull
through the vertex without intravenous contrast.

[Series 4: head 3.0 mpr cor · sagittal · 0.34mm/px · 3 of 59 slices shown]
[im 24/59  brain]
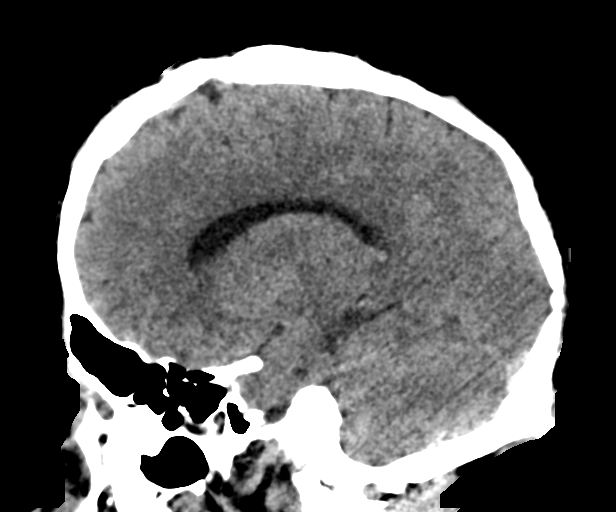
[im 30/59  brain]
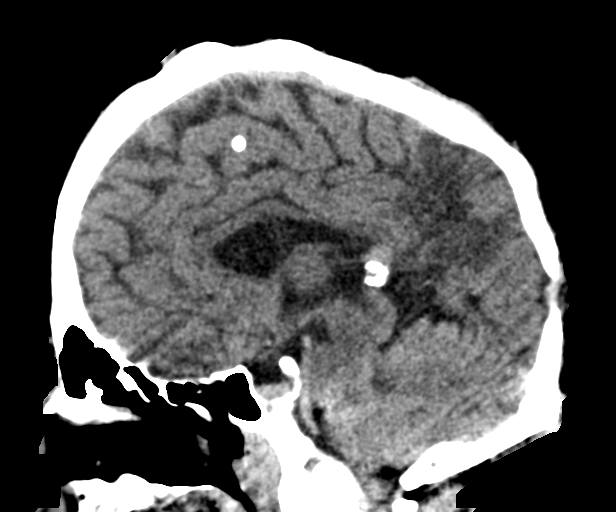
[im 35/59  brain]
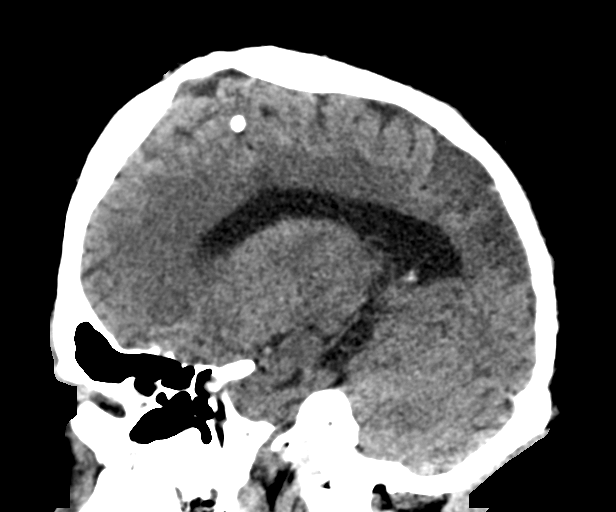

[Series 8: head 5.0 h30s · axial · 0.44mm/px · z∈[-308,-158]mm · 9 of 38 slices shown, 12 images]
[im 4/38  brain]
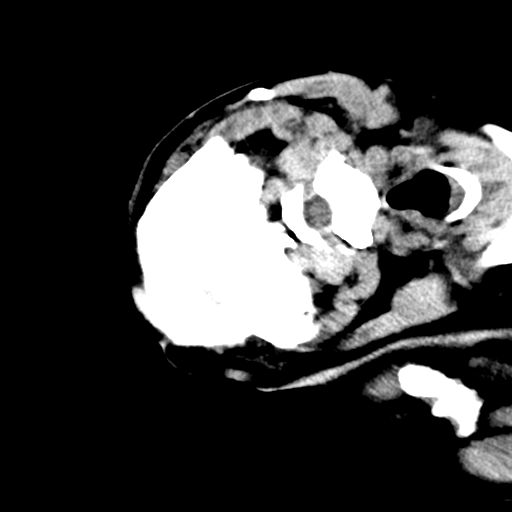
[im 4/38  bone]
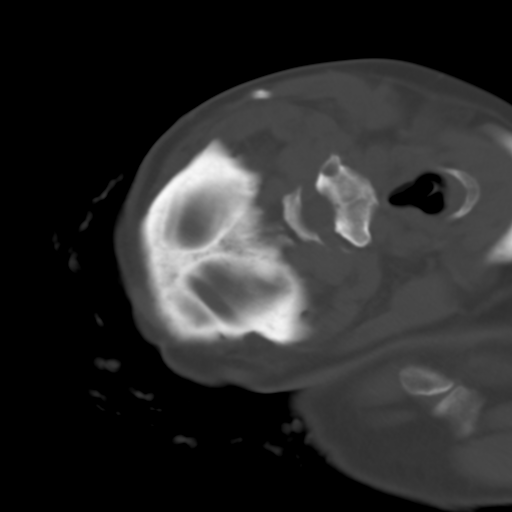
[im 8/38  brain]
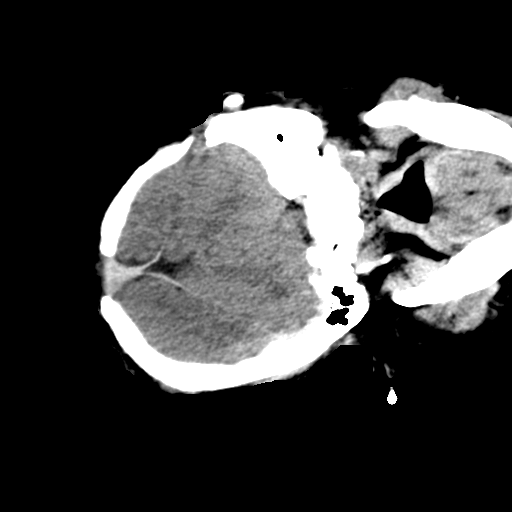
[im 12/38  brain]
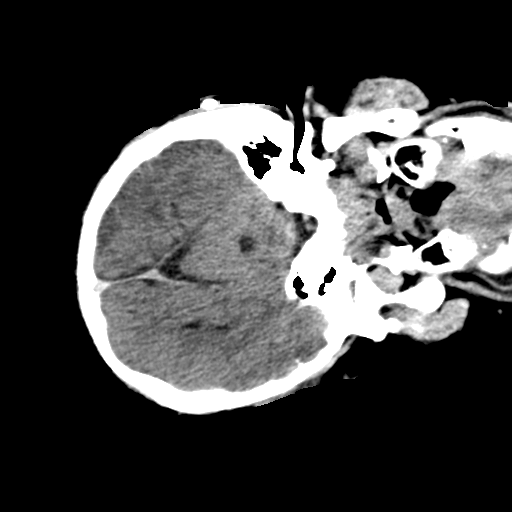
[im 15/38  brain]
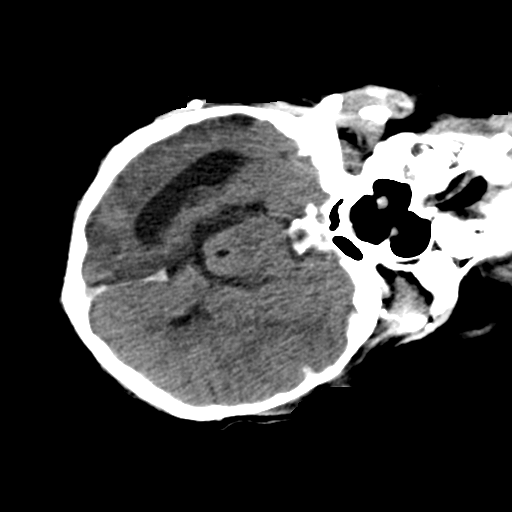
[im 19/38  brain]
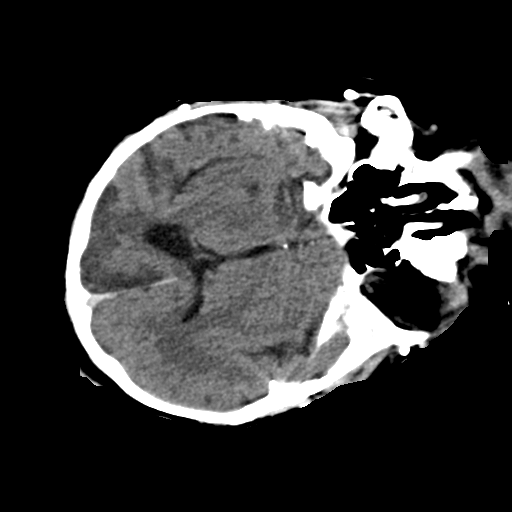
[im 19/38  bone]
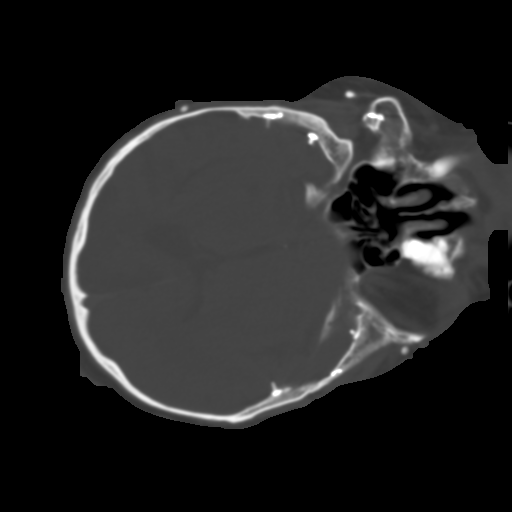
[im 23/38  brain]
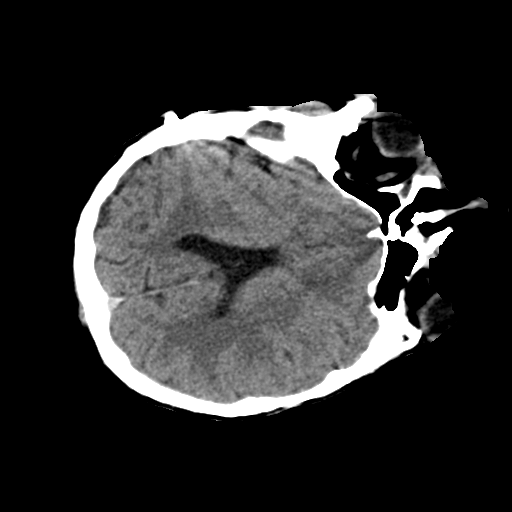
[im 26/38  brain]
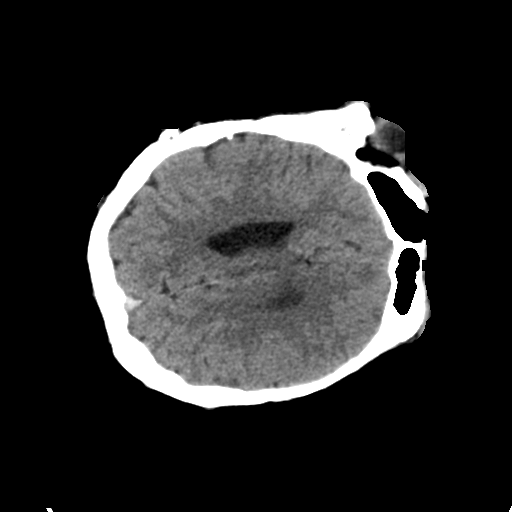
[im 30/38  brain]
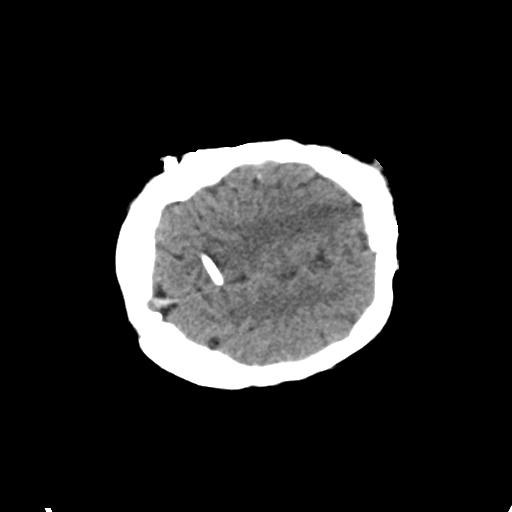
[im 34/38  brain]
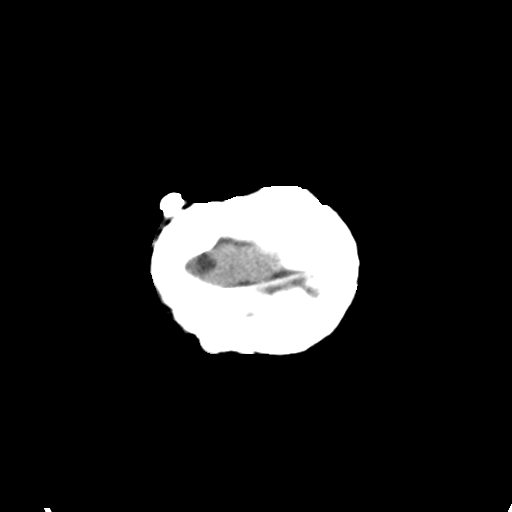
[im 34/38  bone]
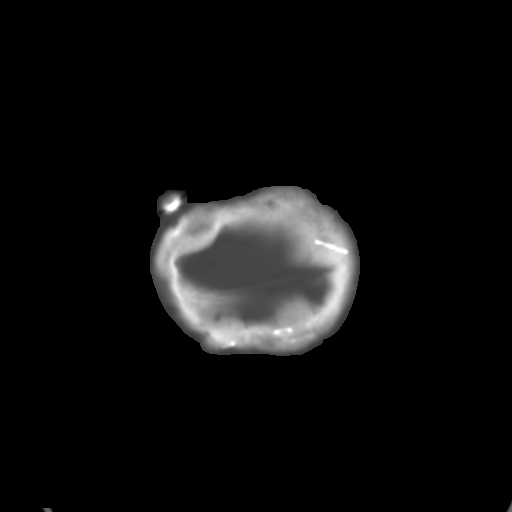

[12 of 37 positions shown; findings below may reference images not displayed]

FINDINGS: Brain: Parietal approach shunt catheter tip terminates near the
superior midline approximating the falx. There is a shifting
ventricular configuration with slight dilatation of the right
lateral ventricles and more effacement of the left lateral
ventricle. Slightly increased caliber of the third ventricle is
well. Fourth ventricle is unchanged. Some increasingly conspicuous
cortical hypoattenuation is seen across the right temporoparietal
region as well as portion of the anterior mesial occipital lobe
([DATE], [DATE]). No hyperdense hemorrhage. No other mass effect or
midline shift.

Vascular: No hyperdense vessel or unexpected calcification.

Skull: Evidence of multiple prior craniotomies. Overlying scalp
thickening and scarring is unchanged from comparison. No acute
fracture or conspicuous osseous lesions seen.

Sinuses/Orbits: Marked exophthalmos. Mild soft tissue thickening
across the left periorbital soft tissues. Thickening in the
paranasal sinuses. No layering air-fluid levels or pneumatized
secretions. Stable radiodensities noted in the frontal sinus outflow
tract. Discontinuity of the posterior septum.

Other: Impacted dentition in the maxilla. Reconstructive changes of
the right sphenoid wing
IMPRESSION: 1. Right area of right temporoparietal and occipital cortical
hypoattenuation, also age findings could be seen in the setting of
active seizure, some underlying ischemia may be present as well.
2. Right parietal approach shunt catheter. Shifting caliber of the
ventricles including dilatation of the right lateral ventricle when
compared to prior. Correlate for features of hydrocephalus.
3. Increasing left periorbital soft tissue swelling and inflammatory
changes on a background of marked exophthalmos.
4. Numerous prior craniotomies and reconstructive changes along the
right sphenoid wing.
5. Sclerotic changes of the maxilla likely related to chronically
impacted dentition.

Currently attempting to contact the ordering provider with a
critical value result. Addendum will be submitted upon case
discussion.

ADDENDUM:
Critical Value/emergent results were called by telephone at the time
of physician contact on 03/27/2021 at [DATE] to provider MOGHTADA UBBINK ,
who verbally acknowledged these results.

*** End of Addendum ***
FINDINGS: Brain: Parietal approach shunt catheter tip terminates near the
superior midline approximating the falx. There is a shifting
ventricular configuration with slight dilatation of the right
lateral ventricles and more effacement of the left lateral
ventricle. Slightly increased caliber of the third ventricle is
well. Fourth ventricle is unchanged. Some increasingly conspicuous
cortical hypoattenuation is seen across the right temporoparietal
region as well as portion of the anterior mesial occipital lobe
([DATE], [DATE]). No hyperdense hemorrhage. No other mass effect or
midline shift.

Vascular: No hyperdense vessel or unexpected calcification.

Skull: Evidence of multiple prior craniotomies. Overlying scalp
thickening and scarring is unchanged from comparison. No acute
fracture or conspicuous osseous lesions seen.

Sinuses/Orbits: Marked exophthalmos. Mild soft tissue thickening
across the left periorbital soft tissues. Thickening in the
paranasal sinuses. No layering air-fluid levels or pneumatized
secretions. Stable radiodensities noted in the frontal sinus outflow
tract. Discontinuity of the posterior septum.

Other: Impacted dentition in the maxilla. Reconstructive changes of
the right sphenoid wing
IMPRESSION: 1. Right area of right temporoparietal and occipital cortical
hypoattenuation, also age findings could be seen in the setting of
active seizure, some underlying ischemia may be present as well.
2. Right parietal approach shunt catheter. Shifting caliber of the
ventricles including dilatation of the right lateral ventricle when
compared to prior. Correlate for features of hydrocephalus.
3. Increasing left periorbital soft tissue swelling and inflammatory
changes on a background of marked exophthalmos.
4. Numerous prior craniotomies and reconstructive changes along the
right sphenoid wing.
5. Sclerotic changes of the maxilla likely related to chronically
impacted dentition.

Currently attempting to contact the ordering provider with a
critical value result. Addendum will be submitted upon case
discussion.

## 2021-07-24 IMAGING — DX DG CHEST 1V
1 series · 1 of 1 positions shown · non-contrast
Comparison: Yesterday

CLINICAL DATA: Shunt malfunction

EXAM:
CHEST  1 VIEW

[chest]
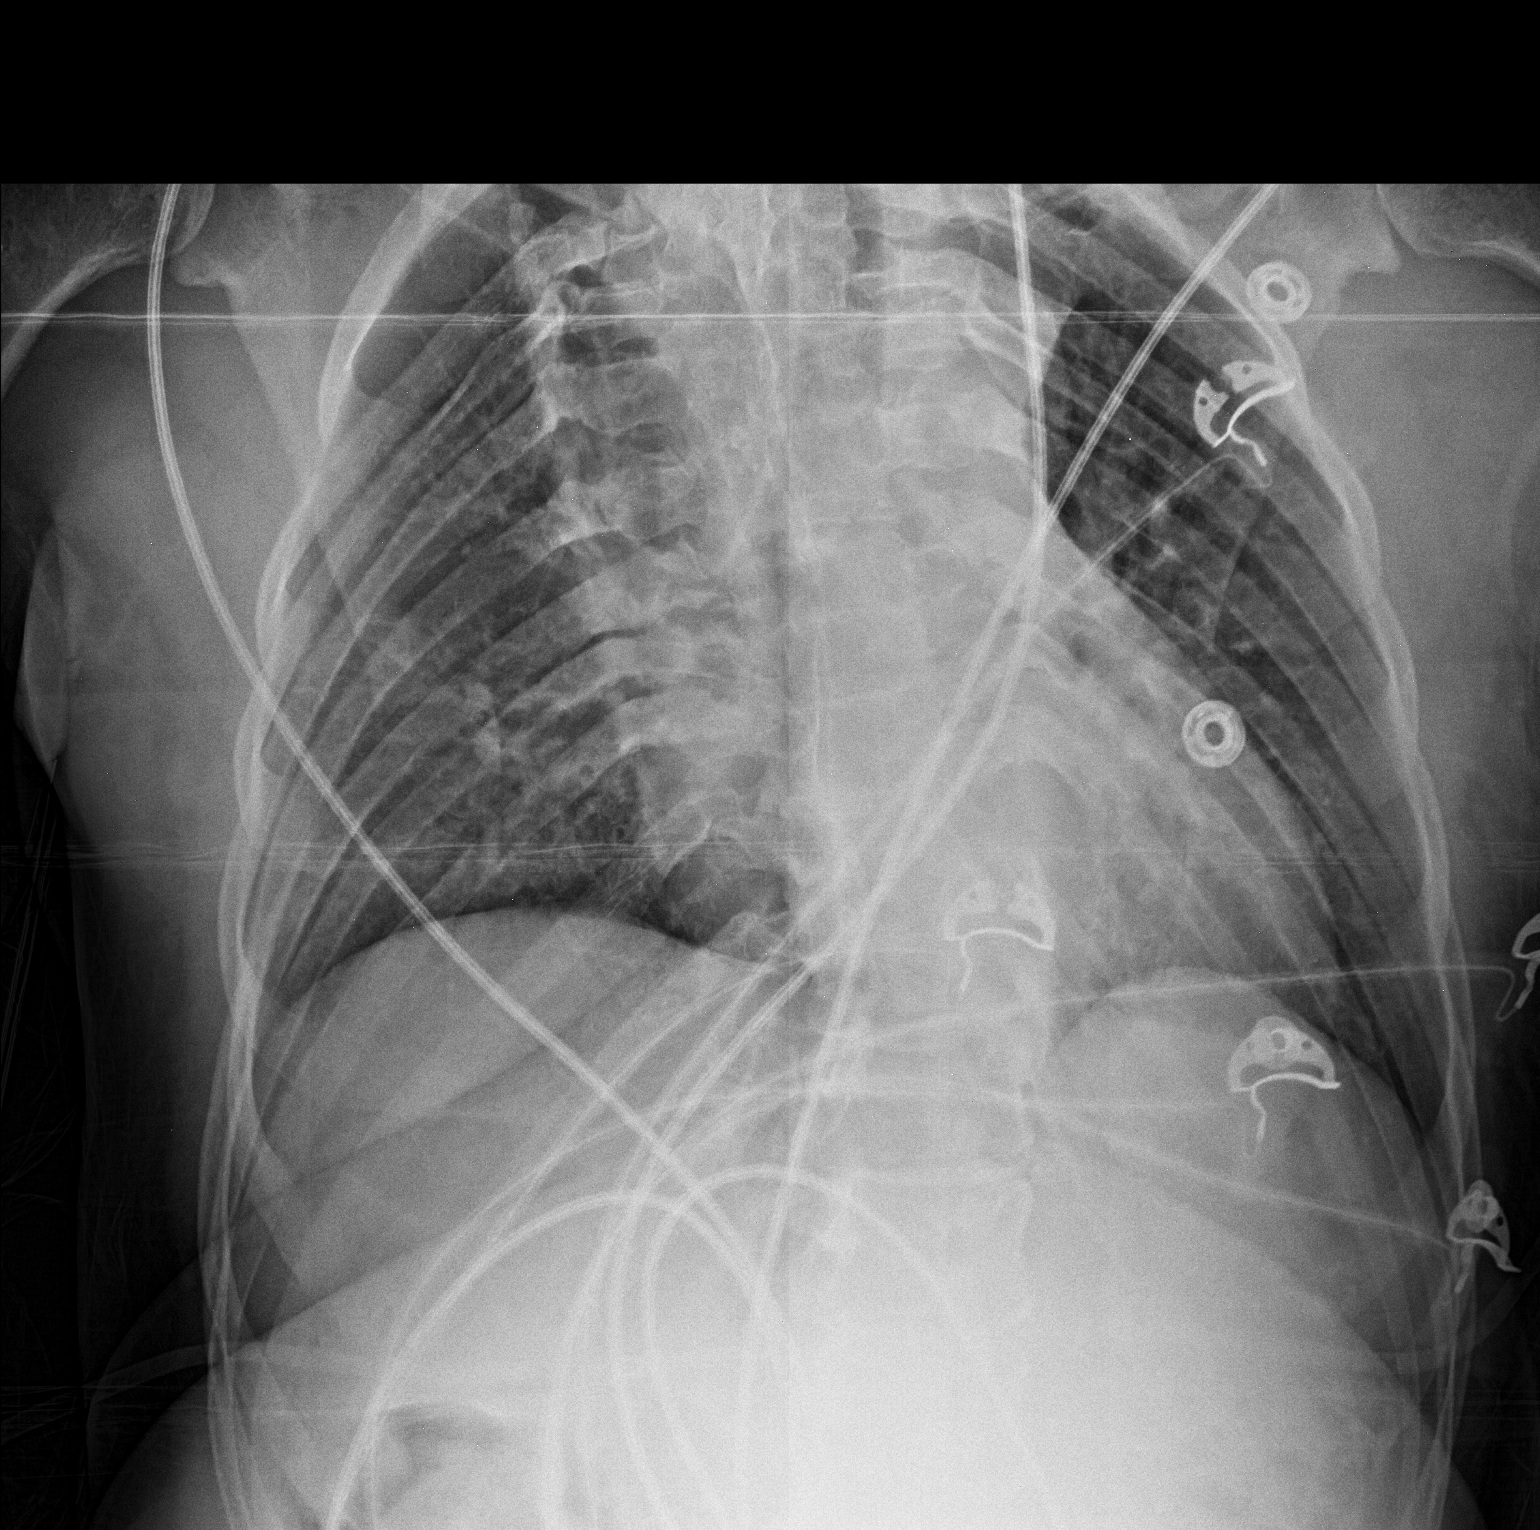

[1 of 1 positions shown; findings below may reference images not displayed]

FINDINGS: Limited by artifact from EKG leads.

Single shunt catheter traversing the right chest showing heavy
dystrophic calcification with fracture at the level of the clavicle.

Normal heart size and stable symmetric lung aeration. No visible
effusion or pneumothorax.
IMPRESSION: Shunt catheter traversing the right chest with heavy calcification
and fracture at the level of the clavicle.

## 2021-08-02 ENCOUNTER — Other Ambulatory Visit: Payer: Self-pay

## 2021-08-02 ENCOUNTER — Ambulatory Visit (INDEPENDENT_AMBULATORY_CARE_PROVIDER_SITE_OTHER): Payer: Medicaid Other | Admitting: Family

## 2021-08-02 ENCOUNTER — Encounter (INDEPENDENT_AMBULATORY_CARE_PROVIDER_SITE_OTHER): Payer: Self-pay | Admitting: Family

## 2021-08-02 VITALS — BP 114/68 | Wt 112.0 lb

## 2021-08-02 DIAGNOSIS — G9341 Metabolic encephalopathy: Secondary | ICD-10-CM | POA: Diagnosis not present

## 2021-08-02 DIAGNOSIS — Z931 Gastrostomy status: Secondary | ICD-10-CM

## 2021-08-02 DIAGNOSIS — E7220 Disorder of urea cycle metabolism, unspecified: Secondary | ICD-10-CM

## 2021-08-02 DIAGNOSIS — Z982 Presence of cerebrospinal fluid drainage device: Secondary | ICD-10-CM

## 2021-08-02 DIAGNOSIS — Q743 Arthrogryposis multiplex congenita: Secondary | ICD-10-CM

## 2021-08-02 DIAGNOSIS — Q751 Craniofacial dysostosis: Secondary | ICD-10-CM

## 2021-08-02 DIAGNOSIS — G800 Spastic quadriplegic cerebral palsy: Secondary | ICD-10-CM

## 2021-08-02 DIAGNOSIS — G40309 Generalized idiopathic epilepsy and epileptic syndromes, not intractable, without status epilepticus: Secondary | ICD-10-CM

## 2021-08-02 DIAGNOSIS — F819 Developmental disorder of scholastic skills, unspecified: Secondary | ICD-10-CM

## 2021-08-03 ENCOUNTER — Telehealth (INDEPENDENT_AMBULATORY_CARE_PROVIDER_SITE_OTHER): Payer: Self-pay | Admitting: Family

## 2021-08-03 ENCOUNTER — Encounter (INDEPENDENT_AMBULATORY_CARE_PROVIDER_SITE_OTHER): Payer: Self-pay | Admitting: Family

## 2021-08-03 DIAGNOSIS — Z931 Gastrostomy status: Secondary | ICD-10-CM

## 2021-08-03 DIAGNOSIS — Q751 Craniofacial dysostosis: Secondary | ICD-10-CM

## 2021-08-03 DIAGNOSIS — R748 Abnormal levels of other serum enzymes: Secondary | ICD-10-CM

## 2021-08-03 DIAGNOSIS — E7220 Disorder of urea cycle metabolism, unspecified: Secondary | ICD-10-CM

## 2021-08-03 DIAGNOSIS — G9341 Metabolic encephalopathy: Secondary | ICD-10-CM

## 2021-08-03 LAB — COMPREHENSIVE METABOLIC PANEL
AG Ratio: 2.1 (calc) (ref 1.0–2.5)
ALT: 43 U/L — ABNORMAL HIGH (ref 6–29)
AST: 27 U/L (ref 10–30)
Albumin: 4.6 g/dL (ref 3.6–5.1)
Alkaline phosphatase (APISO): 50 U/L (ref 31–125)
BUN: 13 mg/dL (ref 7–25)
CO2: 20 mmol/L (ref 20–32)
Calcium: 9.8 mg/dL (ref 8.6–10.2)
Chloride: 104 mmol/L (ref 98–110)
Creat: 0.59 mg/dL (ref 0.50–0.97)
Globulin: 2.2 g/dL (calc) (ref 1.9–3.7)
Glucose, Bld: 78 mg/dL (ref 65–139)
Potassium: 4.5 mmol/L (ref 3.5–5.3)
Sodium: 136 mmol/L (ref 135–146)
Total Bilirubin: 0.2 mg/dL (ref 0.2–1.2)
Total Protein: 6.8 g/dL (ref 6.1–8.1)

## 2021-08-03 LAB — AMMONIA: Ammonia: 98 umol/L — ABNORMAL HIGH (ref <=72)

## 2021-08-03 MED ORDER — LEVOCARNITINE 1 GM/10ML PO SOLN: 500.0000 mg | Freq: Two times a day (BID) | ORAL | 5 refills | Status: DC

## 2021-08-03 MED ORDER — LACTULOSE 10 GM/15ML PO SOLN: ORAL | 5 refills | Status: DC

## 2021-08-03 NOTE — Telephone Encounter (Signed)
This encounter was created in error - please disregard.

## 2021-08-03 NOTE — Telephone Encounter (Signed)
I called Mom and reviewed the ammonia and ALT levels from yesterday. I explained to Mom that we need to continue to work on finding a cause and treatment for this problem. I recommended the following: Lactulose 5-10 ml 3 times per today to produce 2 or 3 soft stools per day Carnitor - 41ml twice per day for liver protection Repeat Ammonia and ALT levels next week Referral to Gastroenterology to evaluate liver and possible metabolic process Genetic testing to assess for metabolic disorders related to elevated ammonia levels. I will email Mom a link to register for a free genetic test by [facility].   I explained to Mom that Alyssa Wong may have to be treated in the hospital if she develops lethargy or seizures before we see if the Lactulose helps to reduce the ammonia level next week.   Mom agreed with these plans.  TG

## 2021-08-05 ENCOUNTER — Encounter (INDEPENDENT_AMBULATORY_CARE_PROVIDER_SITE_OTHER): Payer: Self-pay | Admitting: Family

## 2021-08-05 NOTE — Progress Notes (Signed)
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 She is taking and tolerating Lamotrigine and Levetiracetam for seziures. She has dysphagia and requires feedings by g-tube, with Osmolyte as her formula. She has had recurrent episodes of elevated ammonia levels requiring hospitalization. The initial increase in ammonia was thought to be related to Depakote but she has had further elevations in ammonia after the Depakote was discontinued in early May 2021.  Today's concerns: Avangeline has remained seizure free since her last visit. She has been going to her day program and doing well. When she was last seen, I planned to refer her to an adult genetics provider at [facility] to evaluate the episodes of elevated ammonia. Unfortunately due to staffing challenges they have no openings for over a year. [name] is seen today to recheck the ammonia level.   [name] has been otherwise generally healthy since she was last seen. Mom other health concerns for Alyssa Wong today other than previously mentioned.  Review of systems: Please see HPI for neurologic and other pertinent review of systems. Otherwise all other systems were reviewed and were negative.  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 [facility])     Priority: High   Crouzon syndrome     Priority: High   Excessive protrusion of tongue 04/19/2021   AKI  (acute kidney injury) (HCC) 04/11/2021   Seizure (HCC) 03/28/2021   Metabolic encephalopathy 03/27/2020   SIRS (systemic inflammatory response syndrome) (HCC) 03/26/2020   Altered mental status 03/26/2020   Hypertensive urgency 03/26/2020   Status post insertion of percutaneous endoscopic gastrostomy (PEG) tube (HCC) 03/26/2020   Hyperammonemia (HCC) 03/26/2020   Lactic acidosis 03/26/2020   Loss of weight 07/17/2018   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   Drooling 03/04/2017   Encounter for long-term (current) use of high-risk medication 12/29/2013   Intellectual delay    S/P VP shunt    Arthrogryposis multiplex congenita    Contractures involving both knees      Past Medical History:  Diagnosis Date   Arthrogryposis multiplex congenita    Contractures involving both knees    Crouzon syndrome    Intellectual delay    Seizures (HCC)    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   Social history: Social History   Socioeconomic History   Marital status: Single    Spouse name: Not on file  Number of children: Not on file   Years of education: Not on file   Highest education level: Not on file  Occupational History   Not on file  Tobacco Use   Smoking status: Never   Smokeless tobacco: Never  Substance and Sexual Activity   Alcohol use: No   Drug use: No   Sexual  activity: Never  Other Topics Concern   Not on file  Social History Narrative   Alyssa Wong is a young woman who graduated from [employer] with a certificate in 2013. She is enrolled in the day programs [facility] [employer] [facility] [employer]. She lives with her mother and she has 3 siblings,  6 yo brother at home   Social Determinants of Health   Financial Resource Strain: Not on file  Food Insecurity: Not on file  Transportation Needs: Not on file  Physical Activity: Not on file  Stress: Not on file  Social Connections: Not on file  Intimate Partner Violence: Not on file    Past/failed meds: Copied from previous record: Depakote - elevated ammonia and altered mental status (d/c'd in May 2021)   Allergies: No Known Allergies   Immunizations:  There is no immunization history on file for this patient.   Diagnostics/Screenings: Copied from previous record: 03/26/2020 - CT head wo contrast - 1. Unchanged position of right parietal approach shunt catheter with unchanged size and configuration of the ventricles. 2. No acute intracranial abnormality.   07/05/2006 - CT head wo contrast - Study technically limited to patient positioning and inability to straighten head. Right ventricular drain is in place without hydrocephalus. No definite acute intracranial abnormality   03/28/2020 - rEEG - This study showed evidence of epileptogenicity as well as cortical dysfunction in the right temporoparietal region consistent with underlying craniotomy.  Additionally, there is evidence of mild to moderate diffuse encephalopathy, nonspecific etiology but likely related to patient's history of cerebral palsy and intellectual delay. No seizures were seen throughout the recording.  Physical Exam: BP 114/68   Wt 112 lb (50.8 kg)   BMI 20.49 kg/m   General: small statured but well developed, well nourished woman, seated in wheelchair, in no evident distress; black hair, brown eyes, right  handed Head: doliocephalic and atraumatic. Oropharynx difficult to examine but appears benign. She has exophthalmos, ocular hypertelorism, and midface hyperplasia. Her tongue protrudes.  Neck: tends to hold her head and neck in hyperextended position Cardiovascular: regular rate and rhythm, no murmurs. Respiratory: clear to auscultation bilaterally Abdomen: bowel sounds present all four quadrants, abdomen soft, non-tender, non-distended. No hepatosplenomegaly or masses palpated.Gastrostomy tube in place clean and dry Musculoskeletal: spastic quadriparesis with arthrogryposis in the right upper extremity. Her back is arched Skin: no rashes or neurocutaneous lesions  Neurologic Exam Mental Status: awake and fully alert. Has no language. Does not smile responsively. Resistant to invasions into her space Cranial Nerves: fundoscopic exam - red reflex present.  Unable to fully visualize fundus.  Pupils equal briskly reactive to light. Does not consistently turn to localize faces, objects or sounds in the periphery. Facial movements are asymmetric, has lower facial weakness with drooling.  Motor: spastic quadriparesis with arthrogryposis in the right upper extremity. Very poor fine motor movements.  Sensory: withdrawal x 4 Coordination: unable to adequately assess due to patient's inability to participate in examination. Does not reach for objects. Gait and Station: unable to stand and bear weight.   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 Feige has neither had not required imaging studies since the last visit. She has had lab studies and Mom is aware of those results. Maranatha has had recent episodes of elevated ammonia level for reasons unclear to me. She will have blood drawn today and I will call Mom with results and a treatment plan. I also talked with Mom about performing genetic testing and I will let her know when I have that information. Mom agreed with the plans made today.   The medication list was reviewed and reconciled. No changes were made in the prescribed medications today. A complete medication list was provided to the patient.  Orders Placed This Encounter  Procedures   Ammonia    Standing Status:   Future    Number of Occurrences:   1    Standing Expiration Date:   11/01/2021   Comprehensive metabolic panel    Standing Status:   Future    Number of Occurrences:   1    Standing Expiration Date:   11/01/2021    Order Specific Question:   Has the patient fasted?    Answer:   Yes    Return in about 3 months (around 11/01/2021).   Allergies as of 08/02/2021   No Known Allergies      Medication List        Accurate as of August 02, 2021 11:59 PM. If you have any questions, ask your nurse or doctor.          clobetasol cream 0.05 % Commonly known as: TEMOVATE SMARTSIG:Sparingly Topical Twice Daily   glycopyrrolate 2 MG tablet Commonly known as: ROBINUL TAKE 1 AND 1/2 TABLETS(3 MG) BY MOUTH TWICE DAILY   ketotifen 0.025 % ophthalmic solution Commonly known as: ZADITOR Place 1 drop into the left eye 2 (two) times daily.   labetalol 100 MG tablet Commonly known as: NORMODYNE Place 1 tablet (100 mg total) into feeding tube 2 (two) times daily.   lactulose 10 GM/15ML solution Commonly known as: CHRONULAC Take 3.4 g by mouth 2 (two) times daily. Take 61mL by  mouth twice daily as needed for constipation   lamoTRIgine 25 MG tablet Commonly known as: LAMICTAL Place 3 tablets (75 mg total) into feeding tube 2 (two) times daily.   levETIRAcetam 100 MG/ML solution Commonly known as: KEPPRA Place 10 mLs (1,000 mg total) into feeding tube 2 (two) times daily.   levonorgestrel-ethinyl estradiol 0.15-0.03 MG tablet Commonly known as: SEASONALE Take 1 tablet by mouth daily.        Total time spent with the patient was 20 minutes, of which 50% or more was spent in counseling and coordination of care.  [name] NP-C [facility] Ph. [phone] Fax [phone]

## 2021-08-05 NOTE — Patient Instructions (Signed)
Thank you for coming in today.   Instructions for you until your next appointment are as follows: We will draw blood today to check the ammonia level, liver and kidneys. I will call you when I receive the results.  I will call you when I have worked out the details of doing genetic testing for the elevated ammonia levels.  Continue Alyssa Wong's medications as prescribed for now.  Please sign up for MyChart if you have not done so. Please plan to return for follow up in 3 months or sooner if needed.  At [facility], we are committed to providing exceptional care. You will receive a patient satisfaction survey through text or email regarding your visit today. Your opinion is important to me. Comments are appreciated.

## 2021-08-10 LAB — AMMONIA: Ammonia: 56 umol/L (ref <=72)

## 2021-08-10 LAB — ALT: ALT: 54 U/L — ABNORMAL HIGH (ref 6–29)

## 2021-09-18 ENCOUNTER — Encounter: Payer: Self-pay | Admitting: Gastroenterology

## 2021-09-18 ENCOUNTER — Ambulatory Visit (INDEPENDENT_AMBULATORY_CARE_PROVIDER_SITE_OTHER): Payer: Medicaid Other | Admitting: Gastroenterology

## 2021-09-18 ENCOUNTER — Other Ambulatory Visit: Payer: Self-pay

## 2021-09-18 ENCOUNTER — Other Ambulatory Visit: Payer: Self-pay | Admitting: Gastroenterology

## 2021-09-18 VITALS — BP 124/90 | HR 91

## 2021-09-18 DIAGNOSIS — R7989 Other specified abnormal findings of blood chemistry: Secondary | ICD-10-CM | POA: Diagnosis not present

## 2021-09-18 DIAGNOSIS — K30 Functional dyspepsia: Secondary | ICD-10-CM

## 2021-09-18 NOTE — Addendum Note (Signed)
Addended by: Adela Ports on: 09/18/2021 03:34 PM   Modules accepted: Orders

## 2021-09-18 NOTE — Progress Notes (Signed)
Alyssa Mood MD, MRCP(U.K) [address]  [address]  [place], [place] [address]  Main: [phone]  Fax: [phone]   Gastroenterology Consultation  Referring Provider:     [name], NP Primary Care Physician:  [name], MD Primary Gastroenterologist:  Dr. Wyline Wong  Reason for Consultation:     Abnormal LFTs  HPI:   Alyssa Wong is a 30 y.o. y/o female referred for consultation & management  by Dr. [name], MD.    She has been referred for elevated liver enzymes and metabolic encephalopathy  08/02/2021 ALT 43 and AST 27.  Liver enzymes have been mildly elevated for over 5 months.  They have been elevated in fact for over a year.  When I looked back at her liver exam from May 2021 her ALT was 145 and AST was 90. 03/29/2021 hemoglobin 15 g MCV of 84.  Platelet count of 213. 03/28/2021 right upper quadrant ultrasound shows no abnormalities.  Patient is here with her mother.  Patient cannot provide any information.  He has a history of spastic quadriparesis, status post VP shunt, cerebral palsy, history of seizures.  Nonverbal at baseline.  All information provided by the patient's mother.  I believe that a few years back she was admitted in the hospital and at that point of time was found to have an elevated ammonia level which was attributed to her confusion and at that time she was told that the ammonia level was elevated due to antiseizure medications.  At this point in time her mental status is at her baseline.  She is having diarrhea after taking lactulose 3 times a day.  No known liver disease.  Not having any alcohol consumption.  She is having some issues with her G-tube feeding.  She is on bolus feeding and when she goes to the daycare center each morning they find out that her residuals are more than what it should be and hence they are having to delay the feeds.  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 Authorizing Provider  clobetasol cream (TEMOVATE) 0.05 % SMARTSIG:Sparingly Topical Twice Daily 06/20/21   [provider]  glycopyrrolate (ROBINUL) 2 MG tablet TAKE 1 AND 1/2 TABLETS(3 MG) BY MOUTH TWICE DAILY 06/20/21   [name], NP  ketotifen (ZADITOR) 0.025 % ophthalmic solution Place 1 drop into the left eye 2 (two) times daily. Patient not taking: No sig reported 03/29/21   Esaw Grandchild A, DO  labetalol (NORMODYNE) 100 MG tablet Place 1 tablet (100 mg total) into feeding tube 2 (two) times daily. 03/29/21   Pennie Banter, DO  lactulose (CHRONULAC) 10 GM/15ML solution Take 33ml by tube 3 times per day to produce 2 or 3 soft stools per day 08/03/21   [name], NP  lamoTRIgine (LAMICTAL) 25 MG tablet Place 3 tablets (75 mg total) into feeding tube 2 (two) times daily. 04/16/21   [name], NP  levETIRAcetam (  KEPPRA) 100 MG/ML solution Place 10 mLs (1,000 mg total) into feeding tube 2 (two) times daily. 03/05/21   [name], NP  levOCARNitine (CARNITOR) 1 GM/10ML solution Place 5 mLs (500 mg total) into feeding tube 2 (two) times daily. 08/03/21   [name], NP  levonorgestrel-ethinyl estradiol (SEASONALE) 0.15-0.03 MG tablet Take 1 tablet by mouth daily. 01/04/20   [provider]    Family History  Problem Relation Age of Onset   Kidney disease Maternal Grandfather        Died at 2     Social History   Tobacco Use   Smoking status: Never   Smokeless tobacco: Never  Substance Use Topics   Alcohol use: No   Drug use:  No    Allergies as of 09/18/2021   (No Known Allergies)    Review of Systems:    Unable to provide ROS   Physical Exam:  BP 124/90   Pulse 91  No LMP recorded. (Menstrual status: Oral contraceptives). Psych: Awake and appears comfortable in her wheelchair.  Not able to communicate. General:   Alert,  Well-developed, well-nourished, pleasant and cooperative in NAD Head:  Normocephalic and atraumatic. Eyes:  Sclera clear, no icterus.   Conjunctiva pink. Abdomen:  Normal bowel sounds.  No bruits.  Soft, non-tender and non-distended without masses, hepatosplenomegaly or hernias noted.  No guarding or rebound tenderness.   G-tube position noted in the left head of the abdomen.  Surrounding skin appears normal. Neurologic:  Alert and oriented x0;   Skin:  Intact without significant lesions or rashes. No jaundice. Psych:  Alert and appears comfortable  Imaging Studies: No results found.  Assessment and Plan:   Alyssa Wong is a 30 y.o. y/o female has been referred for abnormal LFTs.  Wide differential diagnosis will need to rule out viral hepatitis and autoimmune liver disease.  Could also be related to use of Keppra or lamotrigine.  At this point of time her LFTs are not significantly elevated compared to baseline.  She is on G-tube feedings.  Having issues with bolus feeding as there are residuals at the daycare center which preventing her from receiving a feeding at time.  I believe based on the history that the elevated ammonia level of your back or to when the patient was admitted could have been related to medications.  Plan 1.  Full autoimmune and viral hepatitis work-up.  If negative does not require any further evaluation would recommend close monitoring of LFTs with primary care provider.  If there is significant jump then would need to consider holding off the antiseizure medications as often it can cause abnormal LFTs.  And choosing an alternative.  2.  G-tube feeds.  She  received 6 bolus feeds a day.  First feed begins at 7 AM and a second feed at 10 AM.  I suggested the mom to start the first feed earlier may be at 5 AM so that the stomach has additional amount of time to empty out and less likelihood of having residuals at 10 AM when she reaches the daycare and is ready for the next bolus feed.  If there is still significant residuals at that point of time then we would need to make sure there is no pyloric stenosis and this can be done by interventional radiology where they can injected a diet through the G-tube to ensure there is no obstruction to the flow of the feeds and delaying gastric emptying.   3.  Elevated ammonia levels have a wide differential diagnosis.  Anytime there is excess muscle activity such as a seizure it can lead to an elevated ammonia level.  In addition medication such as valproic acid, barbiturates can lead to elevated ammonia levels.  In terms of liver disease and ammonia levels 1 needs to have impaired liver function to lead to hepatic encephalopathy. This  patient does not have liver disease as evidenced by normal serum albumin and INR indicating good synthetic function.  Hence this patient is unlikely to have hepatic encephalopathy.  In addition if ammonia has to be checked ,needs to be done from an arterial sample and placed on ice and immediately rushed to the lab.  Anytime a blood sample is been  left outside and not being processed for a period of time will lead to elevation in ammonia levels within the tube.  A diagnosis of hepatic encephalopathy is a diagnosis of exclusion and not based on ammonia sample.  It has a very poor sensitivity and specificity.  There are other causes of elevated ammonia levels such as genetic disorders but my guess is in this patient ammonia levels will be elevated anytime the patient had a seizure.  We should treat the patient based on clinical features rather than the ammonia level.  I would not give the patient  excess lactulose as it can lead to diarrhea subsequently , dehydration and low electrolytes and further and worsen metabolic encephalopathy.  I have suggested the mother to titrated to 2 soft bowel movements per day.     Follow up in 4 to 6 weeks  Dr Alyssa Mood MD,MRCP(U.K)

## 2021-09-18 NOTE — Addendum Note (Signed)
Addended by: Adela Ports on: 09/18/2021 03:40 PM   Modules accepted: Orders

## 2021-09-19 LAB — HEPATITIS B E ANTIGEN: Hep B E Ag: NEGATIVE

## 2021-09-19 LAB — HEPATITIS B E ANTIBODY: Hep B E Ab: NEGATIVE

## 2021-09-19 LAB — HEPATITIS B CORE ANTIBODY, TOTAL: Hep B Core Total Ab: NEGATIVE

## 2021-09-19 LAB — HEPATITIS B SURFACE ANTIBODY,QUALITATIVE: Hep B Surface Ab, Qual: NONREACTIVE

## 2021-09-19 LAB — HEPATITIS C ANTIBODY: Hep C Virus Ab: <0.1 s/co ratio (ref 0.0–0.9)

## 2021-09-19 LAB — HEPATITIS B SURFACE ANTIGEN: Hepatitis B Surface Ag: NEGATIVE

## 2021-09-19 LAB — HEPATITIS A ANTIBODY, TOTAL: hep A Total Ab: NEGATIVE

## 2021-09-25 ENCOUNTER — Telehealth: Payer: Self-pay

## 2021-09-25 NOTE — Telephone Encounter (Signed)
Called patient mother-Alyssa Wong but left her a voicemail in reference to her daughter's lab results. Dr. Tobi Bastos wanted me to let her know that her daughter is in need of the Hepatitis A and B vaccine so to call us to schedule a nurse visit with [place] or she could also get it with her daughter's PCP.

## 2021-09-25 NOTE — Telephone Encounter (Signed)
-----   Message from Wyline Mood, MD sent at 09/25/2021  1:42 PM EDT ----- Not immune to hepatitis a and B suggest vaccination

## 2021-09-26 LAB — IMMUNOGLOBULINS A/E/G/M, SERUM
IgA/Immunoglobulin A, Serum: 78 mg/dL — ABNORMAL LOW (ref 87–352)
IgE (Immunoglobulin E), Serum: 45 IU/mL (ref 6–495)
IgG (Immunoglobin G), Serum: 973 mg/dL (ref 586–1602)
IgM (Immunoglobulin M), Srm: 28 mg/dL (ref 26–217)

## 2021-09-26 LAB — IRON,TIBC AND FERRITIN PANEL
Ferritin: 65 ng/mL (ref 15–150)
Iron Saturation: 23 % (ref 15–55)
Iron: 82 ug/dL (ref 27–159)
Total Iron Binding Capacity: 355 ug/dL (ref 250–450)
UIBC: 273 ug/dL (ref 131–425)

## 2021-09-26 LAB — CK: Total CK: 217 U/L — ABNORMAL HIGH (ref 32–182)

## 2021-09-26 LAB — ANA: ANA Titer 1: NEGATIVE

## 2021-09-26 LAB — ANTI-MICROSOMAL ANTIBODY LIVER / KIDNEY: LKM1 Ab: 1 Units (ref 0.0–20.0)

## 2021-09-26 LAB — CERULOPLASMIN: Ceruloplasmin: 49.1 mg/dL — ABNORMAL HIGH (ref 19.0–39.0)

## 2021-09-26 LAB — MITOCHONDRIAL/SMOOTH MUSCLE AB PNL
Mitochondrial Ab: <20 Units (ref 0.0–20.0)
Smooth Muscle Ab: 4 Units (ref 0–19)

## 2021-09-26 LAB — ALPHA-1-ANTITRYPSIN: A-1 Antitrypsin: 203 mg/dL — ABNORMAL HIGH (ref 100–188)

## 2021-09-26 LAB — HIV ANTIBODY (ROUTINE TESTING W REFLEX): HIV Screen 4th Generation wRfx: NONREACTIVE

## 2021-09-26 LAB — CELIAC DISEASE AB SCREEN W/RFX
Antigliadin Abs, IgA: 3 units (ref 0–19)
Transglutaminase IgA: <2 U/mL (ref 0–3)

## 2021-09-26 LAB — GAMMA GT: GGT: 70 IU/L — ABNORMAL HIGH (ref 0–60)

## 2021-10-31 ENCOUNTER — Encounter: Payer: Self-pay | Admitting: Gastroenterology

## 2021-10-31 ENCOUNTER — Ambulatory Visit (INDEPENDENT_AMBULATORY_CARE_PROVIDER_SITE_OTHER): Payer: Medicaid Other | Admitting: Gastroenterology

## 2021-10-31 VITALS — BP 122/82 | Temp 89.0°F

## 2021-10-31 DIAGNOSIS — R7989 Other specified abnormal findings of blood chemistry: Secondary | ICD-10-CM

## 2021-10-31 DIAGNOSIS — Z23 Encounter for immunization: Secondary | ICD-10-CM

## 2021-10-31 NOTE — Progress Notes (Signed)
Wyline MoodKiran Swain Acree MD, MRCP(U.K) [address]  [address]  [place], [place] [address]  Main: [phone]  Fax: [phone]   Primary Care Physician: Leilani Ableeese, Betti, MD  Primary Gastroenterologist:  Dr. Wyline MoodKiran Lovett Coffin   Chief complaint: Follow-up for abnormal LFTs.   HPI: Alyssa Wong is a 30 y.o. female    Summary of history : Referred to see me on 09/18/2021 for elevated LFTs.  08/02/2021 ALT 43 and AST 27.  Liver enzymes have been mildly elevated for over 5 months.  They have been elevated in fact for over a year.  When I looked back at her liver exam from May 2021 her ALT was 145 and AST was 90. 03/29/2021 hemoglobin 15 g MCV of 84.  Platelet count of 213. 03/28/2021 right upper quadrant ultrasound shows no abnormalities.   Patient is here with her mother.     Patient cannot provide any information.  He has a history of spastic quadriparesis, status post VP shunt, cerebral palsy, history of seizures.  Nonverbal at baseline.  All information provided by the patient's mother.  I believe that a few years back she was admitted in the hospital and at that point of time was found to have an elevated ammonia level which was attributed to her confusion and at that time she was told that the ammonia level was elevated due to antiseizure medications.  At her initial visit on 09/18/2021 her mental status was her baseline .  She was having diarrhea after taking lactulose 3 times a day.  No known liver disease.  Not having any alcohol consumption.  She is having some issues with her G-tube feeding.  She is on bolus feeding and when she goes to the daycare center each morning they find out that her residuals are more than what it should be and hence they are having to delay the feeds   Interval history 09/18/2021-10/31/2021 09/21/2021: CK mildly elevated along with GGT.  Autoimmune and viral hepatitis screen negative.  No new complaints.  She wanted clarification about hepatitis a and B vaccine.   Explained rationale.  The school has tried to change the feeding regimen by spacing it more apart which seems to have helped a bit.  The patient is here today with her mother.  Current Outpatient Medications  Medication Sig Dispense Refill   clobetasol cream (TEMOVATE) 0.05 % SMARTSIG:Sparingly Topical Twice Daily     glycopyrrolate (ROBINUL) 2 MG tablet TAKE 1 AND 1/2 TABLETS(3 MG) BY MOUTH TWICE DAILY 90 tablet 5   ketotifen (ZADITOR) 0.025 % ophthalmic solution Place 1 drop into the left eye 2 (two) times daily. 5 mL 0   labetalol (NORMODYNE) 100 MG tablet Place 1 tablet (100 mg total) into feeding tube 2 (two) times daily. 60 tablet 1   lactulose (CHRONULAC) 10 GM/15ML solution Take 10ml by tube 3 times per day to produce 2 or 3 soft stools per day 946 mL 5   lamoTRIgine (LAMICTAL) 25 MG tablet Place 3 tablets (75 mg total) into feeding tube 2 (two) times daily. 180 tablet 5   levETIRAcetam (KEPPRA) 100 MG/ML solution Place 10 mLs (1,000 mg total) into feeding tube 2 (two) times daily. 680 mL 5   levOCARNitine (CARNITOR) 1 GM/10ML solution Place 5 mLs (500 mg total) into feeding tube 2 (two) times daily. 310 mL 5   levonorgestrel-ethinyl estradiol (SEASONALE) 0.15-0.03 MG tablet Take 1 tablet by mouth daily.     No current facility-administered medications for this visit.  Allergies as of 10/31/2021   (No Known Allergies)    ROS:  General: Negative for anorexia, weight loss, fever, chills, fatigue, weakness. ENT: Negative for hoarseness, difficulty swallowing , nasal congestion. CV: Negative for chest pain, angina, palpitations, dyspnea on exertion, peripheral edema.  Respiratory: Negative for dyspnea at rest, dyspnea on exertion, cough, sputum, wheezing.  GI: See history of present illness. GU:  Negative for dysuria, hematuria, urinary incontinence, urinary frequency, nocturnal urination.  Endo: Negative for unusual weight change.    Physical Examination:   BP 122/82   Temp  (!) 89 F (31.7 C)   General: Well-nourished, well-developed in no acute distress.  Eyes: No icterus. Conjunctivae pink. Skin: Warm and dry, no jaundice.   Psych: Alert    Imaging Studies: No results found.  Assessment and Plan:   Alyssa Wong is a 30 y.o. y/o female here to follow-up for abnormal LFTs.  Autoimmune work-up was negative could also be related to use of Keppra or lamotrigine.  .   Plan 1.  Full autoimmune and viral hepatitis work-up has been negative. Recommend close monitoring of LFTs with primary care provider.  If there is significant jump then would need to consider holding off the antiseizure medications as often it can cause abnormal LFTs.  And choosing an alternative.  Advised hepatitis A and B vaccine    Dr Wyline Mood  MD,MRCP [facility]) Follow up in as needed

## 2021-11-01 ENCOUNTER — Encounter (INDEPENDENT_AMBULATORY_CARE_PROVIDER_SITE_OTHER): Payer: Self-pay | Admitting: Family

## 2021-11-01 ENCOUNTER — Ambulatory Visit (INDEPENDENT_AMBULATORY_CARE_PROVIDER_SITE_OTHER): Payer: Medicaid Other | Admitting: Family

## 2021-11-01 ENCOUNTER — Other Ambulatory Visit: Payer: Self-pay

## 2021-11-01 VITALS — BP 108/70 | HR 84 | Wt 109.0 lb

## 2021-11-01 DIAGNOSIS — Q751 Craniofacial dysostosis: Secondary | ICD-10-CM

## 2021-11-01 DIAGNOSIS — G40309 Generalized idiopathic epilepsy and epileptic syndromes, not intractable, without status epilepticus: Secondary | ICD-10-CM | POA: Diagnosis not present

## 2021-11-01 DIAGNOSIS — Q743 Arthrogryposis multiplex congenita: Secondary | ICD-10-CM | POA: Diagnosis not present

## 2021-11-01 DIAGNOSIS — E7220 Disorder of urea cycle metabolism, unspecified: Secondary | ICD-10-CM

## 2021-11-01 DIAGNOSIS — G800 Spastic quadriplegic cerebral palsy: Secondary | ICD-10-CM

## 2021-11-01 DIAGNOSIS — R748 Abnormal levels of other serum enzymes: Secondary | ICD-10-CM

## 2021-11-01 DIAGNOSIS — M24561 Contracture, right knee: Secondary | ICD-10-CM

## 2021-11-01 DIAGNOSIS — F819 Developmental disorder of scholastic skills, unspecified: Secondary | ICD-10-CM

## 2021-11-01 DIAGNOSIS — Z931 Gastrostomy status: Secondary | ICD-10-CM

## 2021-11-01 DIAGNOSIS — M24562 Contracture, left knee: Secondary | ICD-10-CM

## 2021-11-01 DIAGNOSIS — G9341 Metabolic encephalopathy: Secondary | ICD-10-CM

## 2021-11-01 NOTE — Progress Notes (Signed)
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 She is taking and tolerating Lamotrigine and Levetiracetam for seziures. She has dysphagia and requires feedings by g-tube, with Osmolyte as her formula. She has had recurrent episodes of elevated ammonia levels requiring hospitalization. The initial increase in ammonia was thought to be related to Depakote but she has had further elevations in ammonia after the Depakote was discontinued in early May 2021.  Today's concerns: Alyssa Wong reports that since [name] was evaluated by GI and no interventions were recommended for her problems with increased ammonia level. She has been seizure free and doing well since the last visit.   Alyssa Wong has been otherwise generally healthy since she was last seen. Alyssa Wong has no other health concerns for Alyssa Wong today other than previously mentioned.  Review of systems: Please see HPI for neurologic and other pertinent review of systems. Otherwise all other systems were reviewed and were negative.  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   Drooling 03/04/2017   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   Encounter for long-term (current) use of high-risk medication 12/29/2013   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)    Intellectual delay    S/P VP shunt    Arthrogryposis multiplex congenita    Crouzon syndrome    Contractures involving both knees    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   Intellectual disability 05/25/2012     Past Medical History:  Diagnosis Date   Arthrogryposis multiplex congenita    Contractures involving both knees    Crouzon syndrome    Intellectual delay    Seizures (HCC)    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   Social history: Social History   Socioeconomic History   Marital status: Single    Spouse name: Not on file   Number of children: Not on file   Years of education: Not on file   Highest education level: Not on file  Occupational History   Not on file  Tobacco Use   Smoking status:  Never   Smokeless tobacco: Never  Substance and Sexual Activity   Alcohol use: No   Drug use: No   Sexual activity: Never  Other Topics Concern   Not on file  Social History Narrative   Alyssa Wong is a young woman who graduated from [employer] with a certificate in  2013. She is enrolled in the day programs [employer] [facility] [employer] [facility] [employer]. She lives with her mother and she has 3 siblings,  50 yo brother at home   Social Determinants of Health   Financial Resource Strain: Not on file  Food Insecurity: Not on file  Transportation Needs: Not on file  Physical Activity: Not on file  Stress: Not on file  Social Connections: Not on file  Intimate Partner Violence: Not on file    Past/failed meds: Copied from previous record: Depakote - elevated ammonia and altered mental status (d/c'd in May 2021)   Allergies: No Known Allergies   Immunizations:  There is no immunization history on file for this patient.   Diagnostics/Screenings: Copied from previous record: 03/26/2020 - CT head wo contrast - 1. Unchanged position of right parietal approach shunt catheter with unchanged size and configuration of the ventricles. 2. No acute intracranial abnormality.   07/05/2006 - CT head wo contrast - Study technically limited to patient positioning and inability to straighten head. Right ventricular drain is in place without hydrocephalus. No definite acute intracranial abnormality   03/28/2020 - rEEG - This study showed evidence of epileptogenicity as well as cortical dysfunction in the right temporoparietal region consistent with underlying craniotomy.  Additionally, there is evidence of mild to moderate diffuse encephalopathy, nonspecific etiology but likely related to patient's history of cerebral palsy and intellectual delay. No seizures were seen throughout the recording.  Physical Exam: BP 108/70   Pulse 84   Wt 109 lb (49.4 kg)   BMI 19.94 kg/m   General: small statured but well developed, well nourished woman, seated in wheelchair, in no evident distress Head: doliocephalic and atraumatic. Oropharynx difficult to examine but appears benign. She has exopthalmos, ocular hypertelorism and midface hypoplasia. Her tongue protrudes. Neck:  holds her head and neck in an hyperextended position Cardiovascular: regular rate and rhythm, no murmurs. Respiratory: clear to auscultation bilaterally Abdomen: bowel sounds present all four quadrants, abdomen soft, non-tender, non-distended. No hepatosplenomegaly or masses palpated.Gastrostomy tube in place clean and dry Musculoskeletal: spastic quadriparesis with arthrogryposis in the right upper extremity. Her back is arched.  Skin: no rashes or neurocutaneous lesions  Neurologic Exam Mental Status: awake and fully alert. Has no language. Does not smile responsively. Resistant to invasions in to her space Cranial Nerves: fundoscopic exam - red reflex present.  Unable to fully visualize fundus.  Pupils equal briskly reactive to light. Does not consistently turn to localize faces, objects or sounds in the periphery. Facial movements are asymmetric, has lower facial weakness with drooling.   Motor: spastic quadriparesis with arthrogryposis in the right upper extremity. Poor fine motor movements. Sensory: withdrawal x 4 Coordination: unable to adequately assess due to patient's inability to participate in examination. Does not reach for objects. Gait and Station: unable to stand and bear weight.   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 [facility] has neither had nor required imaging or lab  studies since the last visit other than what was ordered by other providers. Alyssa Wong is aware of those results. Lesia is a 30 year old woman with history of Crouzon syndrome, spastic quadriparesis, intellectual delay and epilepsy. She has remained seizure free on Lamotrigine and Levetiracetam. I talked with Alyssa Wong and asked her to let me know if  Jahnae has any seizures. I will otherwise see her in follow up in 4 months or sooner if needed. Alyssa Wong agreed with the plans made today.   The medication list was reviewed and reconciled. No changes were made in the prescribed medications today. A complete medication list was provided to the patient.  Return in about 4 months (around 03/02/2022).   Allergies as of 11/01/2021   No Known Allergies      Medication List        Accurate as of November 01, 2021 11:59 PM. If you have any questions, ask your nurse or doctor.          clobetasol cream 0.05 % Commonly known as: TEMOVATE SMARTSIG:Sparingly Topical Twice Daily   glycopyrrolate 2 MG tablet Commonly known as: ROBINUL TAKE 1 AND 1/2 TABLETS(3 MG) BY MOUTH TWICE DAILY   ketotifen 0.025 % ophthalmic solution Commonly known as: ZADITOR Place 1 drop into the left eye 2 (two) times daily.   labetalol 100 MG tablet Commonly known as: NORMODYNE Place 1 tablet (100 mg total) into feeding tube 2 (two) times daily.   lactulose 10 GM/15ML solution Commonly known as: CHRONULAC Take 23ml by tube 3 times per day to produce 2 or 3 soft stools per day   lamoTRIgine 25 MG tablet Commonly known as: LAMICTAL Place 3 tablets (75 mg total) into feeding tube 2 (two) times daily.   levETIRAcetam 100 MG/ML solution Commonly known as: KEPPRA Place 10 mLs (1,000 mg total) into feeding tube 2 (two) times daily.   levOCARNitine 1 GM/10ML solution Commonly known as: CARNITOR Place 5 mLs (500 mg total) into feeding tube 2 (two) times daily.   levonorgestrel-ethinyl estradiol 0.15-0.03 MG tablet Commonly known as: SEASONALE Take 1 tablet by mouth daily.      Total time spent with the patient was 20 minutes, of which 50% or more was spent in counseling and coordination of care.  [name] NP-C [facility] Ph. [phone] Fax [phone]

## 2021-11-09 ENCOUNTER — Encounter (INDEPENDENT_AMBULATORY_CARE_PROVIDER_SITE_OTHER): Payer: Self-pay | Admitting: Family

## 2021-11-09 MED ORDER — LEVETIRACETAM 100 MG/ML PO SOLN: 1000.0000 mg | Freq: Two times a day (BID) | ORAL | 5 refills | Status: DC

## 2021-11-09 MED ORDER — LEVOCARNITINE 1 GM/10ML PO SOLN: 500.0000 mg | Freq: Two times a day (BID) | ORAL | 5 refills | Status: DC

## 2021-11-09 MED ORDER — LAMOTRIGINE 25 MG PO TABS: 75.0000 mg | ORAL_TABLET | Freq: Two times a day (BID) | ORAL | 5 refills | Status: DC

## 2021-11-09 NOTE — Patient Instructions (Signed)
Thank you for coming in today.   Instructions for you until your next appointment are as follows: Continue Tobin's medications as prescribed Let me know if she has any seizures or if you have any concerns Please sign up for MyChart if you have not done so. Please plan to return for follow up in 4 months or sooner if needed.  At [facility], we are committed to providing exceptional care. You will receive a patient satisfaction survey through text or email regarding your visit today. Your opinion is important to me. Comments are appreciated.

## 2021-12-03 ENCOUNTER — Ambulatory Visit (INDEPENDENT_AMBULATORY_CARE_PROVIDER_SITE_OTHER): Payer: Medicaid Other | Admitting: Gastroenterology

## 2021-12-03 ENCOUNTER — Other Ambulatory Visit: Payer: Self-pay

## 2021-12-03 DIAGNOSIS — Z23 Encounter for immunization: Secondary | ICD-10-CM

## 2021-12-10 NOTE — Progress Notes (Signed)
Pt here for Twinrix vaccine #2

## 2022-02-01 ENCOUNTER — Telehealth (INDEPENDENT_AMBULATORY_CARE_PROVIDER_SITE_OTHER): Payer: Self-pay | Admitting: Family

## 2022-02-01 NOTE — Telephone Encounter (Signed)
I called the pharmacy. There was a Retail banker regarding the Lamotrigine but they will get that ready for Annalaura today. The Levetiracetam was filled 01/19/22 so is too early. I called Mom and gave her that information. She asked if I could refill the birth control and I declined. TG ?

## 2022-02-01 NOTE — Telephone Encounter (Signed)
?  Who's calling (name and relationship to patient) :Lattie Haw  ? ?Best contact number: ?[phone] ?Provider they see: ?[facility]  ?Reason for call: ?Please send in PA for lamotrigine and keppra ? ?Also please see about authorizing birth control medication  ? ?PATIENT IS COMPLETELY OUT OF SEIZURE MEDICATION  ? ? ? ?PRESCRIPTION REFILL ONLY ? ?Name of prescription: ?Lamotrigine, keppra ?Pharmacy: ? ?[facility] [phone] ?

## 2022-03-04 ENCOUNTER — Ambulatory Visit (INDEPENDENT_AMBULATORY_CARE_PROVIDER_SITE_OTHER): Payer: Medicaid Other | Admitting: Family

## 2022-03-04 ENCOUNTER — Encounter (INDEPENDENT_AMBULATORY_CARE_PROVIDER_SITE_OTHER): Payer: Self-pay | Admitting: Family

## 2022-03-04 VITALS — BP 106/70 | HR 86 | Wt 106.2 lb

## 2022-03-04 DIAGNOSIS — G800 Spastic quadriplegic cerebral palsy: Secondary | ICD-10-CM

## 2022-03-04 DIAGNOSIS — Q743 Arthrogryposis multiplex congenita: Secondary | ICD-10-CM

## 2022-03-04 DIAGNOSIS — Z982 Presence of cerebrospinal fluid drainage device: Secondary | ICD-10-CM

## 2022-03-04 DIAGNOSIS — Q751 Craniofacial dysostosis: Secondary | ICD-10-CM | POA: Diagnosis not present

## 2022-03-04 DIAGNOSIS — H109 Unspecified conjunctivitis: Secondary | ICD-10-CM | POA: Diagnosis not present

## 2022-03-04 DIAGNOSIS — J302 Other seasonal allergic rhinitis: Secondary | ICD-10-CM | POA: Diagnosis not present

## 2022-03-04 DIAGNOSIS — Z931 Gastrostomy status: Secondary | ICD-10-CM

## 2022-03-04 DIAGNOSIS — G40309 Generalized idiopathic epilepsy and epileptic syndromes, not intractable, without status epilepticus: Secondary | ICD-10-CM

## 2022-03-04 DIAGNOSIS — F819 Developmental disorder of scholastic skills, unspecified: Secondary | ICD-10-CM

## 2022-03-04 MED ORDER — CETIRIZINE HCL 5 MG/5ML PO SOLN: 5.0000 mg | Freq: Every day | ORAL | 3 refills | Status: DC

## 2022-03-04 MED ORDER — OLOPATADINE HCL 0.2 % OP SOLN: OPHTHALMIC | 3 refills | Status: DC

## 2022-03-04 NOTE — Patient Instructions (Signed)
It was a pleasure to see you today! ? ?Instructions for you until your next appointment are as follows: ?I sent in a prescription for eye drops for Brynnleigh's reddened eye - give 1 drop per day into the left eye ?I also sent in prescription for generic Zytrec - give 33ml by tube at bedtime ?Continue Fairy's other medications as prescribed ?Let me know if you need anything for the [employer] Waiver. ?Please sign up for MyChart if you have not done so. ?Please plan to return for follow up in 4 months or sooner if needed. ?  ?Feel free to contact our office during normal business hours at [phone] with questions or concerns. If there is no answer or the call is outside business hours, please leave a message and our clinic staff will call you back within the next business day.  If you have an urgent concern, please stay on the line for our after-hours answering service and ask for the on-call neurologist.   ?  ?I also encourage you to use MyChart to communicate with me more directly. If you have not yet signed up for MyChart within [facility], the front desk staff can help you. However, please note that this inbox is NOT monitored on nights or weekends, and response can take up to 2 business days.  Urgent matters should be discussed with the on-call pediatric neurologist.  ? ?At [facility], we are committed to providing exceptional care. You will receive a patient satisfaction survey through text or email regarding your visit today. Your opinion is important to me. Comments are appreciated.   ?

## 2022-03-04 NOTE — Progress Notes (Signed)
? ?Alyssa Wong   ?MRN:  [id number]  ?10-06-1991  ? ?Provider: Rockwell Germany NP-C ?Location of Care: [facility] ? ?Visit type: Return visit ? ?Last visit: 11/01/2021 ? ?Referral source: Lin Landsman, MD ?History from: Epic chart, patient's mother ? 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 She is taking and tolerating Lamotrigine and Levetiracetam for seziures. She has dysphagia and requires feedings by g-tube, with Osmolyte as her formula. She has had recurrent episodes of elevated ammonia levels requiring hospitalization. The initial increase in ammonia was thought to be related to Depakote but she has had further elevations in ammonia after the Depakote was discontinued in early May 2021. ? ?Today's concerns: ?Mom reports today that Alyssa Wong has been doing well since her last visit other than seasonal allergies. She also has considerable redness and tears from the left eye today.  ? ?Deandre had difficulty in the past with tolerating feedings but that has resolved and she is doing well with Osmolyte at this time.  ? ?Mom reports that Alyssa Wong has been approved for [employer] and that she is working on getting that set up for her.  ? ?Alyssa Wong has been seizure free and has been otherwise generally healthy since she was last seen. Mom has no other health concerns for Alyssa Wong today other than previously mentioned. ? ?Review of systems: ?Please see HPI for neurologic and other pertinent review of systems. Otherwise all other systems were reviewed and were negative. ? ?Problem List: ?Patient Active Problem List  ? Diagnosis Date Noted  ? Excessive protrusion of tongue 04/19/2021  ? AKI (acute kidney injury) ([place]) 04/11/2021  ? Seizure ([place]) 03/28/2021  ? Metabolic encephalopathy  [id number]  ? SIRS (systemic inflammatory response syndrome) ([place]) 03/26/2020  ? Altered mental status 03/26/2020  ? Hypertensive urgency 03/26/2020  ? Status post insertion of percutaneous endoscopic gastrostomy (PEG) tube ([place]) 03/26/2020  ? Hyperammonemia ([place]) 03/26/2020  ? Lactic acidosis 03/26/2020  ? Loss of weight 07/17/2018  ? 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 Drooling 03/04/2017  ? 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 Encounter for long-term (current) use of high-risk medication 12/29/2013  ? 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 Intellectual delay   ? S/P VP shunt   ? Arthrogryposis multiplex congenita   ? Crouzon syndrome   ? Contractures involving both knees   ? 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 Intellectual disability 05/25/2012  ?  ? ?Past Medical History:  ?Diagnosis Date  ? Arthrogryposis multiplex congenita   ? Contractures involving both knees   ? Crouzon syndrome   ? Intellectual delay   ? Seizures ([place])   ? 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 VP (ventriculoperitoneal) shunt status   ?  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 ?Surgical history: ?Past Surgical History:  ?Procedure Laterality Date  ? ELBOW SURGERY Left   ? Release of contractures  ? GASTROSTOMY TUBE PLACEMENT    ? HIP SURGERY    ?  ? ?Family history: ?family history includes Kidney disease in her maternal grandfather.  ? ?Social history: ?Social History  ? ?Socioeconomic  History  ? Marital status: Single  ?  Spouse name: Not on file  ? Number of children: Not on file  ? Years of education: Not on file  ? Highest education level: Not on file  ?Occupational History  ? Not on file  ?Tobacco Use  ? Smoking  status: Never  ? Smokeless tobacco: Never  ?Substance and Sexual Activity  ? Alcohol use: No  ? Drug use: No  ? Sexual activity: Never  ?Other Topics Concern  ? Not on file  ?Social History Narrative  ? Alyssa Wong is a young woman who graduated from [employer] with a certificate in 0000000. She is enrolled in the day programs [facility] [employer] [facility] [employer]. She lives with her mother and she has 3 siblings,  31 yo brother at home  ? ?Social Determinants of Health  ? ?Financial Resource Strain: Not on file  ?Food Insecurity: Not on file  ?Transportation Needs: Not on file  ?Physical Activity: Not on file  ?Stress: Not on file  ?Social Connections: Not on file  ?Intimate Partner Violence: Not on file  ?  ? ?Past/failed meds: ?Copied from previous record: ?Depakote - elevated ammonia and altered mental status (d/c'd in May 2021) ?  ?Allergies: ?No Known Allergies  ? ? ?Immunizations: ?Immunization History  ?Administered Date(s) Administered  ? Hep A / Hep B 10/31/2021, 12/03/2021  ?  ? ?Diagnostics/Screenings: ?Copied from previous record: ?03/26/2020 - CT head wo contrast - 1. Unchanged position of right parietal approach shunt catheter with unchanged size and configuration of the ventricles. 2. No acute intracranial abnormality. ?  ?07/05/2006 - CT head wo contrast - Study technically limited to patient positioning and inability to straighten head. Right ventricular drain is in place without hydrocephalus. No definite acute intracranial abnormality ?  ?03/28/2020 - rEEG - This study showed evidence of epileptogenicity as well as cortical dysfunction in the right temporoparietal region consistent with underlying craniotomy. Additionally, there is evidence of mild to moderate diffuse encephalopathy, nonspecific etiology but likely related to patient's history of cerebral palsy and intellectual delay. No seizures were seen throughout the recording. ? ?Physical Exam: ?BP 106/70   Pulse 86   Wt 106 lb 3.2 oz  (48.2 kg)   BMI 19.42 kg/m?   ?General: short statured but well developed, well nourished woman, seated in wheelchair, in no evident distress ?Head: doliocephalic and atraumatic. Oropharynx difficult to examine but appears benign other than clear nasal congestion and watery, red left eye. She has exopthalmos, ocular hypertelorism, and midface hypoplasia. She has protrusion of the tongue. ?Neck: holds her head and neck in a hyperextended position ?Cardiovascular: regular rate and rhythm, no murmurs. ?Respiratory: clear to auscultation bilaterally ?Abdomen: bowel sounds present all four quadrants, abdomen soft, non-tender, non-distended. Gastrostomy tube in place ?Musculoskeletal: has neuromuscular scoliosis. Her back is arched. She has spastic quadriparesis with arthrogryposis in the the right upper extremity ?Skin: no rashes or neurocutaneous lesions ? ?Neurologic Exam ?Mental Status: awake and fully alert. Has no language. Does not smile responsively. Reaches for my hand and held it during the visit, otherwise resistant to invasions in to her space ?Cranial Nerves: fundoscopic exam - red reflex present.  Unable to fully visualize fundus.  Pupils equal briskly reactive to light. Does not turn to localize faces, objects or sounds in the periphery. Facial movements are asymmetric, has lower facial weakness with drooling.  Neck flexion and extension abnormal with head and neck in hyperextended position ?Motor: spastic quadriparesis. Poor fine  motor movements. ?Sensory: withdrawal x 4 ?Coordination: unable to adequately assess due to patient's inability to participate in examination. Does not reach for objects. ?Gait and Station: unable to stand and bear weight. ? ?Impression: ?Crouzon syndrome ? ?Conjunctivitis of left eye, unspecified conjunctivitis type - Plan: Olopatadine HCl 0.2 % SOLN ? ?Seasonal allergies - Plan: cetirizine HCl (ZYRTEC) 5 MG/5ML SOLN ? 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 ?Generalized convulsive epilepsy ([place]) ? ?Status post insertion of percutaneous endoscopic gastrostomy (PEG) tube ([place]) ? ?Intellectual delay ? ?S/P VP shunt ? ?Arthrogryposis multiplex congenita  ? ? ?Recommenda

## 2022-03-18 ENCOUNTER — Other Ambulatory Visit (INDEPENDENT_AMBULATORY_CARE_PROVIDER_SITE_OTHER): Payer: Self-pay | Admitting: Family

## 2022-03-18 DIAGNOSIS — Z931 Gastrostomy status: Secondary | ICD-10-CM

## 2022-03-18 DIAGNOSIS — E7220 Disorder of urea cycle metabolism, unspecified: Secondary | ICD-10-CM

## 2022-03-18 DIAGNOSIS — R748 Abnormal levels of other serum enzymes: Secondary | ICD-10-CM

## 2022-03-18 DIAGNOSIS — Q751 Craniofacial dysostosis: Secondary | ICD-10-CM

## 2022-03-18 DIAGNOSIS — G9341 Metabolic encephalopathy: Secondary | ICD-10-CM

## 2022-04-30 ENCOUNTER — Telehealth (INDEPENDENT_AMBULATORY_CARE_PROVIDER_SITE_OTHER): Payer: Self-pay | Admitting: Family

## 2022-04-30 DIAGNOSIS — K117 Disturbances of salivary secretion: Secondary | ICD-10-CM

## 2022-04-30 DIAGNOSIS — G9341 Metabolic encephalopathy: Secondary | ICD-10-CM

## 2022-04-30 DIAGNOSIS — Q751 Craniofacial dysostosis: Secondary | ICD-10-CM

## 2022-04-30 DIAGNOSIS — Z931 Gastrostomy status: Secondary | ICD-10-CM

## 2022-04-30 DIAGNOSIS — G800 Spastic quadriplegic cerebral palsy: Secondary | ICD-10-CM

## 2022-04-30 DIAGNOSIS — R748 Abnormal levels of other serum enzymes: Secondary | ICD-10-CM

## 2022-04-30 DIAGNOSIS — E7220 Disorder of urea cycle metabolism, unspecified: Secondary | ICD-10-CM

## 2022-04-30 MED ORDER — LEVOCARNITINE 1 GM/10ML PO SOLN: ORAL | 5 refills | Status: DC

## 2022-04-30 MED ORDER — GLYCOPYRROLATE 2 MG PO TABS: ORAL_TABLET | ORAL | 5 refills | Status: DC

## 2022-04-30 NOTE — Telephone Encounter (Signed)
  Name of who is calling:Lisa   Caller's Relationship to Patient:Mother   Best contact [phone]   Provider they NOM:VEHM Goodpasture   Reason for call:medication refill      PRESCRIPTION REFILL ONLY  Name of prescription:Levocarnitine and Glycopyrrolate   Pharmacy:[facility] on [address], [place], [place]

## 2022-04-30 NOTE — Telephone Encounter (Signed)
I sent in the refills electronically. TG

## 2022-06-03 ENCOUNTER — Ambulatory Visit (INDEPENDENT_AMBULATORY_CARE_PROVIDER_SITE_OTHER): Payer: Medicaid Other | Admitting: Gastroenterology

## 2022-06-03 DIAGNOSIS — Z23 Encounter for immunization: Secondary | ICD-10-CM | POA: Diagnosis not present

## 2022-06-07 ENCOUNTER — Encounter: Payer: Self-pay | Admitting: Gastroenterology

## 2022-06-10 NOTE — Progress Notes (Signed)
vaccine 

## 2022-07-04 ENCOUNTER — Ambulatory Visit (INDEPENDENT_AMBULATORY_CARE_PROVIDER_SITE_OTHER): Payer: Medicaid Other | Admitting: Family

## 2022-07-08 ENCOUNTER — Ambulatory Visit (INDEPENDENT_AMBULATORY_CARE_PROVIDER_SITE_OTHER): Payer: Medicaid Other | Admitting: Family

## 2022-07-08 ENCOUNTER — Encounter (INDEPENDENT_AMBULATORY_CARE_PROVIDER_SITE_OTHER): Payer: Self-pay | Admitting: Family

## 2022-07-08 VITALS — HR 68 | Wt 100.0 lb

## 2022-07-08 DIAGNOSIS — G40309 Generalized idiopathic epilepsy and epileptic syndromes, not intractable, without status epilepticus: Secondary | ICD-10-CM | POA: Diagnosis not present

## 2022-07-08 DIAGNOSIS — G800 Spastic quadriplegic cerebral palsy: Secondary | ICD-10-CM

## 2022-07-08 DIAGNOSIS — Q751 Craniofacial dysostosis: Secondary | ICD-10-CM

## 2022-07-08 DIAGNOSIS — Z982 Presence of cerebrospinal fluid drainage device: Secondary | ICD-10-CM

## 2022-07-08 DIAGNOSIS — G9341 Metabolic encephalopathy: Secondary | ICD-10-CM

## 2022-07-08 DIAGNOSIS — Z931 Gastrostomy status: Secondary | ICD-10-CM

## 2022-07-08 DIAGNOSIS — E7220 Disorder of urea cycle metabolism, unspecified: Secondary | ICD-10-CM

## 2022-07-08 DIAGNOSIS — M4140 Neuromuscular scoliosis, site unspecified: Secondary | ICD-10-CM

## 2022-07-08 DIAGNOSIS — Q743 Arthrogryposis multiplex congenita: Secondary | ICD-10-CM

## 2022-07-08 DIAGNOSIS — R748 Abnormal levels of other serum enzymes: Secondary | ICD-10-CM

## 2022-07-08 DIAGNOSIS — F819 Developmental disorder of scholastic skills, unspecified: Secondary | ICD-10-CM

## 2022-07-08 NOTE — Progress Notes (Signed)
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 She is taking and tolerating Lamotrigine and Levetiracetam for seziures. She has dysphagia and requires feedings by g-tube, with Osmolyte as her formula. She has had recurrent episodes of elevated ammonia levels requiring hospitalization. The initial increase in ammonia was thought to be related to Depakote but she has had further elevations in ammonia after the Depakote was discontinued in early May 2021.  Today's concerns: Mom reports today that Alyssa Wong has been doing well. She has remained seizure free and receives her medication regularly. She has been tolerating her feedings. Alyssa Wong attends a day program and enjoys those activities.   Alyssa Wong has been otherwise generally healthy since she was last seen. Mom has no other health concerns for Alyssa Wong today other than previously mentioned.  Review of systems: Please see HPI for neurologic and other pertinent review of systems. Otherwise all other systems were reviewed and were negative.  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)     Priority: High   Crouzon syndrome     Priority: High   Seasonal allergies 03/04/2022   Conjunctivitis of left eye 03/04/2022   Excessive protrusion of tongue 04/19/2021   AKI (acute kidney injury) (HCC) 04/11/2021   Seizure (HCC) 03/28/2021    Metabolic encephalopathy 03/27/2020   SIRS (systemic inflammatory response syndrome) (HCC) 03/26/2020   Altered mental status 03/26/2020   Hypertensive urgency 03/26/2020   Status post insertion of percutaneous endoscopic gastrostomy (PEG) tube (HCC) 03/26/2020   Hyperammonemia (HCC) 03/26/2020   Lactic acidosis 03/26/2020   Loss of weight 07/17/2018   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   Drooling 03/04/2017   Encounter for long-term (current) use of high-risk medication 12/29/2013   Intellectual delay    S/P VP shunt    Arthrogryposis multiplex congenita    Contractures involving both knees    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   Intellectual disability 05/25/2012     Past Medical History:  Diagnosis Date   Arthrogryposis multiplex congenita    Contractures involving both knees    Crouzon syndrome    Intellectual delay    Seizures (HCC)    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   Social history: Social History   Socioeconomic History   Marital status: Single    Spouse name: Not on file   Number of children: Not on  file   Years of education: Not on file   Highest education level: Not on file  Occupational History   Not on file  Tobacco Use   Smoking status: Never   Smokeless tobacco: Never  Substance and Sexual Activity   Alcohol use: No   Drug use: No    Sexual activity: Never  Other Topics Concern   Not on file  Social History Narrative   Alyssa Wong is a young woman who graduated from [employer] with a certificate in 2013. She is enrolled in the day programs [employer] [facility] [employer] [facility] [employer]. She lives with her mother and she has 3 siblings,  69 yo brother at home   Social Determinants of Health   Financial Resource Strain: Not on file  Food Insecurity: Not on file  Transportation Needs: Not on file  Physical Activity: Not on file  Stress: Not on file  Social Connections: Not on file  Intimate Partner Violence: Not on file    Past/failed meds: Copied from previous record: Depakote - elevated ammonia and altered mental status (d/c'd in May 2021)   Allergies: No Known Allergies   Immunizations: Immunization History  Administered Date(s) Administered   Hep A / Hep B 10/31/2021, 12/03/2021, 06/03/2022    Diagnostics/Screenings: Copied from previous record: 03/26/2020 - CT head wo contrast - 1. Unchanged position of right parietal approach shunt catheter with unchanged size and configuration of the ventricles. 2. No acute intracranial abnormality.   07/05/2006 - CT head wo contrast - Study technically limited to patient positioning and inability to straighten head. Right ventricular drain is in place without hydrocephalus. No definite acute intracranial abnormality   03/28/2020 - rEEG - This study showed evidence of epileptogenicity as well as cortical dysfunction in the right temporoparietal region consistent with underlying craniotomy. Additionally, there is evidence of mild to moderate diffuse encephalopathy, nonspecific etiology but likely related to patient's history of cerebral palsy and intellectual delay. No seizures were seen throughout the recording.   Physical Exam: Pulse 68   Wt 100 lb (45.4 kg)   BMI 18.29 kg/m   General: short statured but well developed, well nourished woman, seated in wheelchair, in no  evident distress Head: oropharynx difficult to examine but appears benign. Doliocephalic with exopthalmos, ocular hypertelorism and midface hypoplasia, along with protrusion of the tongue Neck: holds her head in a hyperextended position Cardiovascular: regular rate and rhythm, no murmurs. Respiratory: clear to auscultation bilaterally Abdomen: bowel sounds present all four quadrants, abdomen soft, non-tender, non-distended. Gastrostomy tube in place. Musculoskeletal: neuromuscular scoliosis. Her back is arched. She has spastic quadriparesis with arthrogryposis in the right upper extremity Skin: no rashes or neurocutaneous lesions  Neurologic Exam Mental Status: awake and fully alert. Has no language.  Does not smile responsively. Occasionally held my hand during the visit but was otherwise resistant to invasions into her space Cranial Nerves: fundoscopic exam - red reflex present.  Unable to fully visualize fundus.  Pupils equal briskly reactive to light. Does not turn to localize faces and objects in the periphery. Turns to localize sounds in the periphery. Facial movements are asymmetric, has lower facial weakness with drooling.  Head and neck held in a hyperextended position Motor: spastic quadriparesis. Poor fine motor movements. Sensory: withdrawal x 4 Coordination: unable to adequately assess due to patient's inability to participate in examination. Does not reach for objects. Gait and Station: unable to independently stand and bear weight.   Impression: Crouzon syndrome  Spastic quadriparesis secondary to  cerebral palsy ([place])  Gastrostomy tube dependent (HCC)  Generalized convulsive epilepsy ([place])  Intellectual delay  S/P VP shunt  Arthrogryposis multiplex congenita  Neuromuscular scoliosis, unspecified spinal region    Recommendations for plan of care: The patient's previous Epic records were reviewed. Alyssa Wong has neither had nor required imaging or lab studies since the  last visit. She has remained seizure free and otherwise generally healthy since her last visit. I asked Mom to let me know if Alyssa Wong has seizures or if she has any concerns. I will see her in follow up for 6 months or sooner if needed. Mom agreed with the plans made today.   The medication list was reviewed and reconciled. No changes were made in the prescribed medications today. A complete medication list was provided to the patient.  I brought Dr Rogers Blocker to the exam room and introduced her to Mom as my supervising provider now that Dr Gaynell Face has retired.   Return in about 6 months (around 01/08/2023).   Allergies as of 07/08/2022   No Known Allergies      Medication List        Accurate as of July 08, 2022 11:59 PM. If you have any questions, ask your nurse or doctor.          STOP taking these medications    cetirizine HCl 5 MG/5ML Soln Commonly known as: Zyrtec Stopped by: Rockwell Germany, NP   labetalol 100 MG tablet Commonly known as: NORMODYNE Stopped by: Rockwell Germany, NP       TAKE these medications    clobetasol cream 0.05 % Commonly known as: TEMOVATE   glycopyrrolate 2 MG tablet Commonly known as: ROBINUL TAKE 1 AND 1/2 TABLETS(3 MG) BY MOUTH TWICE DAILY   ketotifen 0.025 % ophthalmic solution Commonly known as: ZADITOR Place 1 drop into the left eye 2 (two) times daily.   lactulose 10 GM/15ML solution Commonly known as: CHRONULAC Take 72ml by tube 3 times per day to produce 2 or 3 soft stools per day   lamoTRIgine 25 MG tablet Commonly known as: LAMICTAL Place 3 tablets (75 mg total) into feeding tube 2 (two) times daily.   levETIRAcetam 100 MG/ML solution Commonly known as: KEPPRA Place 10 mLs (1,000 mg total) into feeding tube 2 (two) times daily.   levOCARNitine 1 GM/10ML solution Commonly known as: CARNITOR TAKE 5 ML VIA FEEDING TUBE TWICE DAILY   levonorgestrel-ethinyl estradiol 0.15-0.03 MG tablet Commonly known as:  SEASONALE Take 1 tablet by mouth daily.   Olopatadine HCl 0.2 % Soln Place 1 drop in left eye once per day      Total time spent with the patient was 20 minutes, of which 50% or more was spent in counseling and coordination of care.  Rockwell Germany NP-C [facility] Ph. [phone] Fax [phone]

## 2022-07-08 NOTE — Patient Instructions (Signed)
It was a pleasure to see you today!  Instructions for you until your next appointment are as follows: Continue giving Teeghan the medications as prescribed Let me know if Angenette has seizures or if you have any other concerns  Please sign up for MyChart if you have not done so. Please plan to return for follow up in 6 months or sooner if needed.  Feel free to contact our office during normal business hours at [phone] with questions or concerns. If there is no answer or the call is outside business hours, please leave a message and our clinic staff will call you back within the next business day.  If you have an urgent concern, please stay on the line for our after-hours answering service and ask for the on-call neurologist.     I also encourage you to use MyChart to communicate with me more directly. If you have not yet signed up for MyChart within [facility], the front desk staff can help you. However, please note that this inbox is NOT monitored on nights or weekends, and response can take up to 2 business days.  Urgent matters should be discussed with the on-call pediatric neurologist.   At [facility], we are committed to providing exceptional care. You will receive a patient satisfaction survey through text or email regarding your visit today. Your opinion is important to me. Comments are appreciated.

## 2022-07-12 ENCOUNTER — Encounter (INDEPENDENT_AMBULATORY_CARE_PROVIDER_SITE_OTHER): Payer: Self-pay | Admitting: Family

## 2022-07-12 MED ORDER — LEVOCARNITINE 1 GM/10ML PO SOLN: ORAL | 5 refills | Status: DC

## 2022-07-12 MED ORDER — LAMOTRIGINE 25 MG PO TABS: 75.0000 mg | ORAL_TABLET | Freq: Two times a day (BID) | ORAL | 5 refills | Status: DC

## 2022-07-12 MED ORDER — LEVETIRACETAM 100 MG/ML PO SOLN: 1000.0000 mg | Freq: Two times a day (BID) | ORAL | 5 refills | Status: DC

## 2022-11-27 ENCOUNTER — Other Ambulatory Visit (INDEPENDENT_AMBULATORY_CARE_PROVIDER_SITE_OTHER): Payer: Self-pay | Admitting: Family

## 2022-11-27 DIAGNOSIS — G40309 Generalized idiopathic epilepsy and epileptic syndromes, not intractable, without status epilepticus: Secondary | ICD-10-CM

## 2022-11-28 NOTE — Telephone Encounter (Signed)
Seen 07/09/22 follow up 01/08/23  last filled 07/12/22 with 5 rf  Refilled with 2 refills to last until appt

## 2023-01-08 ENCOUNTER — Ambulatory Visit (INDEPENDENT_AMBULATORY_CARE_PROVIDER_SITE_OTHER): Payer: Medicaid Other | Admitting: Family

## 2023-01-08 ENCOUNTER — Encounter (INDEPENDENT_AMBULATORY_CARE_PROVIDER_SITE_OTHER): Payer: Self-pay | Admitting: Family

## 2023-01-08 VITALS — BP 122/82 | Ht <= 58 in | Wt 100.0 lb

## 2023-01-08 DIAGNOSIS — F819 Developmental disorder of scholastic skills, unspecified: Secondary | ICD-10-CM | POA: Diagnosis not present

## 2023-01-08 DIAGNOSIS — R0981 Nasal congestion: Secondary | ICD-10-CM

## 2023-01-08 DIAGNOSIS — G9341 Metabolic encephalopathy: Secondary | ICD-10-CM

## 2023-01-08 DIAGNOSIS — G800 Spastic quadriplegic cerebral palsy: Secondary | ICD-10-CM

## 2023-01-08 DIAGNOSIS — R748 Abnormal levels of other serum enzymes: Secondary | ICD-10-CM

## 2023-01-08 DIAGNOSIS — E7220 Disorder of urea cycle metabolism, unspecified: Secondary | ICD-10-CM

## 2023-01-08 DIAGNOSIS — R0689 Other abnormalities of breathing: Secondary | ICD-10-CM

## 2023-01-08 DIAGNOSIS — G40309 Generalized idiopathic epilepsy and epileptic syndromes, not intractable, without status epilepticus: Secondary | ICD-10-CM

## 2023-01-08 DIAGNOSIS — Z77120 Contact with and (suspected) exposure to mold (toxic): Secondary | ICD-10-CM

## 2023-01-08 DIAGNOSIS — Z931 Gastrostomy status: Secondary | ICD-10-CM | POA: Diagnosis not present

## 2023-01-08 DIAGNOSIS — Q751 Craniofacial dysostosis: Secondary | ICD-10-CM | POA: Diagnosis not present

## 2023-01-08 DIAGNOSIS — K117 Disturbances of salivary secretion: Secondary | ICD-10-CM

## 2023-01-08 NOTE — Progress Notes (Unsigned)
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 She is taking and tolerating Lamotrigine and Levetiracetam for seziures. She has dysphagia and requires feedings by g-tube, with Osmolyte as her formula. She has had recurrent episodes of elevated ammonia levels requiring hospitalization. The initial increase in ammonia was thought to be related to Depakote but she has had further elevations in ammonia after the Depakote was discontinued in early May 2021.   Today's concerns: Suction machine doesn't work anymore. The power works but there is no suction. Has had nasal congestion and is unable to clear airway.  Snores during sleep and had stopped snoring years ago. She had tracheostomy as a child and Alyssa Wong is concerned about her airway since she has started snoring Some cough with nasal congestion. Mom is giving Mucinex which has helped to thin secretions Mom interested in referral to Allergist because of nasal congestion, snoring at night and known presence of mold in the home. Tolerating feedings. Alyssa Wong, 4 cartons per day Continues to attend day program and enjoys the activities. Alyssa Wong has been otherwise generally healthy since she was last seen. No health concerns today other than previously mentioned.  Review of systems: Please see HPI for neurologic and other pertinent review of systems. Otherwise all other systems were reviewed and were negative.  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)     Priority: High   Crouzon syndrome     Priority: High   Seasonal allergies 03/04/2022   Conjunctivitis of left eye 03/04/2022   Excessive protrusion of tongue 04/19/2021   AKI (acute kidney injury) ([place]) 04/11/2021   Seizure ([place]) XX123456   Metabolic encephalopathy XX123456   SIRS (systemic inflammatory response syndrome) ([place]) 03/26/2020   Altered mental status 03/26/2020   Hypertensive urgency 03/26/2020   Status post insertion of percutaneous endoscopic gastrostomy (PEG) tube ([place]) 03/26/2020   Hyperammonemia ([place]) 03/26/2020   Lactic acidosis 03/26/2020   Loss of weight 07/17/2018   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 Had a tracheostomy as a child.  Surgical history: Past Surgical  History:  Procedure Laterality Date   ELBOW SURGERY Left    Release of contractures   GASTROSTOMY TUBE PLACEMENT     HIP SURGERY       Family history: family history includes  Kidney disease in her maternal grandfather.   Social history: Social History   Socioeconomic History   Marital status: Single    Spouse name: Not on file   Number of children: Not on file   Years of education: Not on file   Highest education level: Not on file  Occupational History   Not on file  Tobacco Use   Smoking status: Never   Smokeless tobacco: Never  Substance and Sexual Activity   Alcohol use: No   Drug use: No   Sexual activity: Never  Other Topics Concern   Not on file  Social History Narrative   Alyssa Wong is a young woman who graduated from [employer] with a certificate in 0000000. She is enrolled in the day programs [employer]. She lives with her mother    Social Determinants of Health   Financial Resource Strain: Not on file  Food Insecurity: Not on file  Transportation Needs: Not on file  Physical Activity: Not on file  Stress: Not on file  Social Connections: Not on file  Intimate Partner Violence: Not on file      Past/failed meds: Copied from previous record: Depakote - elevated ammonia and altered mental status (d/c'd in May 2021)   Allergies: No Known Allergies    Immunizations: Immunization History  Administered Date(s) Administered   Hep A / Hep B 10/31/2021, 12/03/2021, 06/03/2022      Diagnostics/Screenings: Copied from previous record: 03/26/2020 - CT head wo contrast - 1. Unchanged position of right parietal approach shunt catheter with unchanged size and configuration of the ventricles. 2. No acute intracranial abnormality.   07/05/2006 - CT head wo contrast - Study technically limited to patient positioning and inability to straighten head. Right ventricular drain is in place without hydrocephalus. No definite acute intracranial abnormality   03/28/2020 - rEEG - This study showed evidence of epileptogenicity as well as cortical dysfunction in the right temporoparietal region consistent with  underlying craniotomy. Additionally, there is evidence of mild to moderate diffuse encephalopathy, nonspecific etiology but likely related to patient's history of cerebral palsy and intellectual delay. No seizures were seen throughout the recording.  Physical Exam: BP 122/82 (BP Location: Left Arm, Patient Position: Sitting, Cuff Size: Small)   Ht 4' 4.7" (1.339 m)   Wt 100 lb (45.4 kg)   BMI 25.32 kg/m   General: well developed, well nourished, seated, in no evident distress Head: normocephalic and atraumatic. Oropharynx benign. No dysmorphic features. Neck: supple Cardiovascular: regular rate and rhythm, no murmurs. Respiratory: clear to auscultation bilaterally Abdomen: bowel sounds present all four quadrants, abdomen soft, non-tender, non-distended. No hepatosplenomegaly or masses palpated.Gastrostomy tube in place size *** Musculoskeletal: no skeletal deformities or obvious scoliosis. Has contractures**** Skin: no rashes or neurocutaneous lesions  Neurologic Exam Mental Status: awake and fully alert. Has no language.  Smiles responsively. Resistant to invasions into ***space Cranial Nerves: fundoscopic exam - red reflex present.  Unable to fully visualize fundus.  Pupils equal briskly reactive to light.  Turns to localize faces and objects in the periphery. Turns to localize sounds in the periphery. Facial movements are asymmetric, has lower facial weakness with drooling.  Neck flexion and extension *** abnormal with poor head control.  Motor: truncal hypotonia.  ***  spastic quadriparesis  Sensory: withdrawal x 4 Coordination: unable to adequately assess due to patient's inability to participate in examination. No dysmetria when reaching for objects. Gait and Station: unable to independently stand and bear weight. Able to stand with assistance but needs constant support. Able to take a few steps but has poor balance and needs support.  Reflexes: diminished and symmetric. Toes neutral. 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 Recent diagnostic studies were reviewed with the patient.  Plan until next visit: Continue medications as prescribed  Suction machine ordered from [facility] Referral placed for Allergy and [place] [facility] if seizures occur or any concerns Return in about 6 months (around 07/09/2023).  The medication list was reviewed and reconciled. No changes were made in the prescribed medications today. A complete medication list was provided to the patient.  Orders Placed This Encounter  Procedures   Ambulatory referral to Allergy    Referral Priority:   Routine    Referral Type:   Allergy Testing    Referral Reason:   Specialty Services Required    Requested Specialty:   Allergy    Number of Visits Requested:   1     Allergies as of 01/08/2023   No Known Allergies      Medication List        Accurate as of January 08, 2023  3:40 PM. If you have any questions, ask your nurse or doctor.          clobetasol cream 0.05 % Commonly known as: TEMOVATE   glycopyrrolate 2 MG tablet Commonly known as: ROBINUL TAKE 1 AND 1/2 TABLETS(3 MG) BY MOUTH TWICE DAILY   ketotifen 0.025 % ophthalmic solution Commonly known as: ZADITOR Place 1 drop into the left eye 2 (two) times daily.   lactulose 10 GM/15ML solution Commonly known as: CHRONULAC Take 59m by tube 3 times per day to produce 2 or 3 soft  stools per day   lamoTRIgine 25 MG tablet Commonly known as: LAMICTAL Place 3 tablets (75 mg total) into feeding tube 2 (two) times daily.   levETIRAcetam 100 MG/ML solution Commonly known as: KEPPRA PLACE 10 ML INTO FEEDING TUBE TWICE DAILY   levOCARNitine 1 GM/10ML solution Commonly known as: CARNITOR TAKE 5 ML VIA FEEDING TUBE TWICE DAILY   levonorgestrel-ethinyl estradiol 0.15-0.03 MG tablet Commonly known as: SEASONALE Take 1 tablet by mouth daily.   Olopatadine HCl 0.2 % Soln Place 1 drop in left eye once per day            I discussed this patient's care with the multiple providers involved in her care today to develop this assessment and plan.   Total time spent with the patient was *** minutes, of which 50% or more was spent in counseling and coordination of care.  TRockwell GermanyNP-C [facility] [id number] [place] [id number]. [phone] [phone]

## 2023-01-10 NOTE — Patient Instructions (Signed)
It was a pleasure to see you today!  Instructions for you until your next appointment are as follows: Continue medications as prescribed  Suction machine ordered from [facility] Referral placed for Allergy and [place] Call if seizures occur or any concerns Please sign up for MyChart if you have not done so. Please plan to return for follow up in 6 months or sooner if needed.   Feel free to contact our office during normal business hours at [phone] with questions or concerns. If there is no answer or the call is outside business hours, please leave a message and our clinic staff will call you back within the next business day.  If you have an urgent concern, please stay on the line for our after-hours answering service and ask for the on-call neurologist.     I also encourage you to use MyChart to communicate with me more directly. If you have not yet signed up for MyChart within [facility], the front desk staff can help you. However, please note that this inbox is NOT monitored on nights or weekends, and response can take up to 2 business days.  Urgent matters should be discussed with the on-call pediatric neurologist.   At [facility], we are committed to providing exceptional care. You will receive a patient satisfaction survey through text or email regarding your visit today. Your opinion is important to me. Comments are appreciated.

## 2023-01-11 ENCOUNTER — Encounter (INDEPENDENT_AMBULATORY_CARE_PROVIDER_SITE_OTHER): Payer: Self-pay | Admitting: Family

## 2023-01-11 MED ORDER — LAMOTRIGINE 25 MG PO TABS: 75.0000 mg | ORAL_TABLET | Freq: Two times a day (BID) | ORAL | 5 refills | Status: DC

## 2023-01-11 MED ORDER — LEVETIRACETAM 100 MG/ML PO SOLN: ORAL | 5 refills | Status: DC

## 2023-01-11 MED ORDER — LEVOCARNITINE 1 GM/10ML PO SOLN: ORAL | 5 refills | Status: DC

## 2023-01-11 MED ORDER — GLYCOPYRROLATE 2 MG PO TABS: ORAL_TABLET | ORAL | 5 refills | Status: DC

## 2023-01-16 ENCOUNTER — Telehealth (INDEPENDENT_AMBULATORY_CARE_PROVIDER_SITE_OTHER): Payer: Self-pay | Admitting: Family

## 2023-01-16 NOTE — Telephone Encounter (Signed)
Who's calling (name and relationship to patient) : Thersa Salt; [place] contact number: [phone]  Provider they see: Cloretta Ned, np  Reason for call: Kim lvm to see if Otila Kluver could sign orders on [facility] Tube feedings. Maudie Mercury has been trying to reach Dr. Ayesha Rumpf office for the past 2 weeks,but have heard back from them yrt. Maudie Mercury will fax over the orders and will be out of office until Monday morning.   Call ID:      PRESCRIPTION REFILL ONLY  Name of prescription:  Pharmacy:

## 2023-01-17 NOTE — Telephone Encounter (Signed)
I am happy to sign the feeding orders for [facility]. Ellie, please let me know when you receive them.  Thanks, Otila Kluver

## 2023-02-07 ENCOUNTER — Telehealth (INDEPENDENT_AMBULATORY_CARE_PROVIDER_SITE_OTHER): Payer: Self-pay | Admitting: Family

## 2023-02-07 NOTE — Telephone Encounter (Signed)
The refills for her seizure medicines were sent to [facility] on [address] in February. I don't prescribe her birth control. [place], could you check into this? Thanks, Otila Kluver

## 2023-02-07 NOTE — Telephone Encounter (Addendum)
Attempted to contacted patient's mother to inform her that the medication is too early to fill.  Pharmacy stated that she picked up [place] on 3.10.2024 of a quantity of 240ML left from an old RX.   Pharmacist stated that they have roughly 10-15 days before they can pick-up again.    Left HIPAA compliant VM stating this and asked her to contact her PCP for the other medication.   SS, CCMA

## 2023-02-07 NOTE — Telephone Encounter (Signed)
Who's calling (name and relationship to patient) : Naomie Dean; mom   [facility] contact number: [phone]  Provider they see: Cloretta Ned, NP  Reason for call: Mom was calling in stating that she is having issues with the pharmacy regarding Prior authorization for the birth control and seizure meds (keppra, lamotrigine).  Mom stated she will run out.   Call ID:      PRESCRIPTION REFILL ONLY  Name of prescription:  Pharmacy:

## 2023-02-14 ENCOUNTER — Ambulatory Visit (INDEPENDENT_AMBULATORY_CARE_PROVIDER_SITE_OTHER): Payer: Medicaid Other | Admitting: Internal Medicine

## 2023-02-14 ENCOUNTER — Other Ambulatory Visit: Payer: Self-pay

## 2023-02-14 ENCOUNTER — Encounter: Payer: Self-pay | Admitting: Internal Medicine

## 2023-02-14 VITALS — BP 122/80 | HR 102 | Temp 98.5°F | Resp 20 | Ht 59.0 in | Wt 100.0 lb

## 2023-02-14 DIAGNOSIS — J310 Chronic rhinitis: Secondary | ICD-10-CM

## 2023-02-14 DIAGNOSIS — R21 Rash and other nonspecific skin eruption: Secondary | ICD-10-CM

## 2023-02-14 MED ORDER — CETIRIZINE HCL 10 MG PO TABS: 10.0000 mg | ORAL_TABLET | Freq: Every day | ORAL | 5 refills | Status: DC

## 2023-02-14 MED ORDER — HYDROCORTISONE 2.5 % EX CREA: TOPICAL_CREAM | Freq: Two times a day (BID) | CUTANEOUS | 5 refills | Status: DC

## 2023-02-14 MED ORDER — FLUTICASONE PROPIONATE 50 MCG/ACT NA SUSP: 2.0000 | Freq: Every day | NASAL | 5 refills | Status: DC

## 2023-02-14 MED ORDER — TRIAMCINOLONE ACETONIDE 0.1 % EX OINT: TOPICAL_OINTMENT | CUTANEOUS | 5 refills | Status: DC

## 2023-02-14 NOTE — Patient Instructions (Addendum)
Chronic Rhinitis - We will obtain blood testing to aeroallergen today.  - Use Flonase (Fluticasone) 2 sprays each nostril daily. Aim upward and outward. - Use Zyrtec (Cetirizine) 10 mg daily as needed for runny nose, sneezing, itchy watery eyes.  - For eyes, use Olopatadine or Ketotifen 1 eye drop daily as needed for itchy, watery eyes.  Available over the counter, if not covered by insurance.   Rash:  - Take pictures of the rash. This might be eczema.    - Do a daily soaking tub bath in warm water for 10-15 minutes.  - Use a gentle, unscented cleanser at the end of the bath (such as Dove unscented bar or baby wash, or Aveeno sensitive body wash). Then rinse, pat half-way dry, and apply a gentle, unscented moisturizer cream or ointment (Cerave, Cetaphil, Eucerin, Aveeno)  all over while still damp. Dry skin makes the itching worse. The skin should be moisturized with a gentle, unscented moisturizer at least twice daily.  - Use only unscented liquid laundry detergent. - Apply prescribed topical steroid (triamcinolone 0.1% below neck or for severe flare ups or hydrocortisone 2.5% above neck or for mild flare ups) to flared areas (red and thickened eczema) after the moisturizer has soaked into the skin (wait at least 30 minutes). Taper off the topical steroids as the skin improves. Do not use topical steroid for more than 7-10 days at a time.

## 2023-02-14 NOTE — Progress Notes (Signed)
NEW PATIENT  Date of Service/Encounter:  02/14/23  Consult requested by: Rockwell Germany, NP   Subjective:   Alyssa Wong (DOB: 24-Aug-1991) is a 32 y.o. female who presents to the clinic on 02/14/2023 with a chief complaint of Allergic Rhinitis , Urticaria, and Nasal Congestion .    History obtained from: chart review and patient and mother.  Rhinitis:  Started years ago, unsure exactly when.  Symptoms include: nasal congestion, rhinorrhea, post nasal drainage, and sneezing  Occurs year-round Potential triggers: there is mold/mildew in the house   Treatments tried:  None recently Previously on oral anti histamine and unknown nose sprays   Previous allergy testing: no History of sinus surgery: no Nonallergic triggers: none   Rash: Wonders if it is eczema.  It mostly breaks out on her back and sometimes elbows.  Rash is itchy, red, dry.  Topical steroids do help, can't recall which ones she is using exactly.  Uses Caress soap because others make her break out.  Uses Eucerin to moisturize.  Does not look like hives.   No history of eczema in childhood.    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)    VP (ventriculoperitoneal) shunt status    Past Surgical History: Past Surgical History:  Procedure Laterality Date   ELBOW SURGERY Left    Release of contractures   GASTROSTOMY TUBE PLACEMENT     HIP SURGERY      Family History: Family History  Problem Relation Age of Onset   Allergic rhinitis Maternal Aunt    Allergic rhinitis Maternal Uncle    Kidney disease Maternal Grandfather        Died at 39    Social History:  Lives in a 24 year house Flooring in bedroom: wood Pets: dog Tobacco use/exposure: none   Job: disabled, day  program  Medication List:  Allergies as of 02/14/2023   No Known Allergies      Medication List        Accurate as of February 14, 2023 11:07 AM. If you have any questions, ask your nurse or doctor.          cetirizine 10 MG tablet Commonly known as: ZyrTEC Allergy Take 1 tablet (10 mg total) by mouth daily. Started by: Larose Kells, MD   clobetasol cream 0.05 % Commonly known as: TEMOVATE   desonide 0.05 % cream Commonly known as: DESOWEN Apply 1 Application topically 2 (two) times daily.   fluticasone 50 MCG/ACT nasal spray Commonly known as: FLONASE Place 2 sprays into both nostrils daily. Started by: Larose Kells, MD   glycopyrrolate 2 MG tablet Commonly known as: ROBINUL TAKE 1 AND 1/2 TABLETS(3 MG) BY MOUTH TWICE DAILY   hydrocortisone 2.5 % cream Apply topically 2 (two) times daily. Apply twice daily for flare ups above neck, maximum 7 days. Started by: Larose Kells, MD   ketotifen 0.025 % ophthalmic solution Commonly known as: ZADITOR Place 1 drop into the left eye 2 (two) times daily.   lactulose 10 GM/15ML solution Commonly known as: CHRONULAC Take 86ml by tube 3 times per day to produce 2 or 3 soft stools per day   lamoTRIgine 25 MG tablet Commonly known as: LAMICTAL Place 3 tablets (75 mg total) into  feeding tube 2 (two) times daily.   levETIRAcetam 100 MG/ML solution Commonly known as: KEPPRA PLACE 10 ML INTO FEEDING TUBE TWICE DAILY   levOCARNitine 1 GM/10ML solution Commonly known as: CARNITOR TAKE 5 ML VIA FEEDING TUBE TWICE DAILY   levonorgestrel-ethinyl estradiol 0.15-0.03 MG tablet Commonly known as: SEASONALE Take 1 tablet by mouth daily.   loratadine 10 MG tablet Commonly known as: CLARITIN Take 10 mg by mouth daily as needed.   Olopatadine HCl 0.2 % Soln Place 1 drop in left eye once per day   triamcinolone ointment 0.1 % Commonly known as: KENALOG Apply twice daily for flare ups below neck, maximum 10 days. Started by:  Larose Kells, MD         REVIEW OF SYSTEMS: Pertinent positives and negatives discussed in HPI.   Objective:   Physical Exam: BP 122/80   Pulse (!) 102   Temp 98.5 F (36.9 C) (Temporal)   Resp 20   Ht 4\' 11"  (1.499 m) Comment: STATED BY PATIENT MOM  Wt 100 lb (45.4 kg) Comment: STATED BY PATIENT MOM  SpO2 99%   BMI 20.20 kg/m  Body mass index is 20.2 kg/m. GEN: alert, non talkative HEENT: clear conjunctiva,  nose with + inferior turbinate hypertrophy, pink nasal mucosa, slight clear rhinorrhea HEART: regular rate and rhythm, no murmur LUNGS: clear to auscultation anteriorly, no coughing, unlabored respiration ABDOMEN: soft, non distended  SKIN: hyperpigmented thickened patch on L elbow, some hyperpigmented spots on back  Reviewed:  01/08/2023: seen by Peds Neuro [facility] NP for Crouzon syndrome with generalized seizures, spastic quadriparesis, cognitive delay. On Lamotrigine and Levetiracetam.  Also have trouble with snoring and congestion and has mold/mildew in house so referred to Allergy for evaluation.   10/31/2021: seen by Dr. Vicente Males GI for abnormal LFTs, discussed possibility of relation to anti-seizures medications.   03/27/2021-04/11/2021: admitted for tonic-clonic seizures, thought to be related to hyperammonemia and started on lactulose.   Assessment:   1. Chronic rhinitis   2. Rash and nonspecific skin eruption     Plan/Recommendations:  Chronic Rhinitis - We will obtain blood testing to aeroallergen today. - Use Flonase (Fluticasone) 2 sprays each nostril daily. Aim upward and outward. - Use Zyrtec (Cetirizine) 10 mg daily as needed for runny nose, sneezing, itchy watery eyes.  - For eyes, use Olopatadine or Ketotifen 1 eye drop daily as needed for itchy, watery eyes.  Available over the counter, if not covered by insurance.  - Will consider ENT referral if no improvement or negative testing.   Rash:  - Take pictures of the rash. This might be eczema.     - Do a daily soaking tub bath in warm water for 10-15 minutes.  - Use a gentle, unscented cleanser at the end of the bath (such as Dove unscented bar or baby wash, or Aveeno sensitive body wash). Then rinse, pat half-way dry, and apply a gentle, unscented moisturizer cream or ointment (Cerave, Cetaphil, Eucerin, Aveeno)  all over while still damp. Dry skin makes the itching worse. The skin should be moisturized with a gentle, unscented moisturizer at least twice daily.  - Use only unscented liquid laundry detergent. - Apply prescribed topical steroid (triamcinolone 0.1% below neck or for severe flare ups or hydrocortisone 2.5% above neck or for mild flare ups) to flared areas (red and thickened eczema) after the moisturizer has soaked into the skin (wait at least 30 minutes). Taper off the topical steroids as the skin improves. Do not  use topical steroid for more than 7-10 days at a time.        Return in about 3 months (around 05/17/2023).  Harlon Flor, MD [facility] [place] [facility] [place]

## 2023-02-20 LAB — ALLERGENS W/TOTAL IGE AREA 2
Alternaria Alternata IgE: <0.1 kU/L
Aspergillus Fumigatus IgE: <0.1 kU/L
Bermuda Grass IgE: 0.2 kU/L — AB
Cat Dander IgE: 42.5 kU/L — AB
Cedar, Mountain IgE: 0.16 kU/L — AB
Cladosporium Herbarum IgE: <0.1 kU/L
Cockroach, German IgE: <0.1 kU/L
Common Silver Birch IgE: 0.24 kU/L — AB
Cottonwood IgE: 0.2 kU/L — AB
D Farinae IgE: <0.1 kU/L
D Pteronyssinus IgE: <0.1 kU/L
Dog Dander IgE: 0.45 kU/L — AB
Elm, American IgE: 0.13 kU/L — AB
IgE (Immunoglobulin E), Serum: 161 IU/mL (ref 6–495)
Johnson Grass IgE: <0.1 kU/L
Maple/Box Elder IgE: 0.19 kU/L — AB
Mouse Urine IgE: <0.1 kU/L
Oak, White IgE: 0.25 kU/L — AB
Pecan, Hickory IgE: 0.12 kU/L — AB
Penicillium Chrysogen IgE: <0.1 kU/L
Pigweed, Rough IgE: 0.3 kU/L — AB
Ragweed, Short IgE: 0.31 kU/L — AB
Sheep Sorrel IgE Qn: 0.15 kU/L — AB
Timothy Grass IgE: <0.1 kU/L
White Mulberry IgE: <0.1 kU/L

## 2023-02-25 ENCOUNTER — Telehealth: Payer: Self-pay | Admitting: Internal Medicine

## 2023-02-25 NOTE — Telephone Encounter (Signed)
Patient mom was calling to get the result of her labs.[phone]

## 2023-02-25 NOTE — Telephone Encounter (Signed)
Called patient - DPR verified - unable to LMOVM - per recording, the mailbox is full and can not leave a message, good bye.  Patient has viewed lab results via myChart .

## 2023-03-03 NOTE — Telephone Encounter (Signed)
Mom called back and we discussed the lab results from Dr. Allena Katz. Mom verbalized understanding as asked that a copy of results be mailed to the address on file.

## 2023-03-17 ENCOUNTER — Telehealth: Payer: Self-pay

## 2023-03-17 NOTE — Telephone Encounter (Signed)
Parent called and said that she had an episode this past weekend that caused her eyes to get red,puffy,itchy. I asked parent if she has tried anything and patient said that she had tried pataday and warm compresses since Saturday. Parent said that it has been better and has came down a bit but is still crusty. Patient has also tried Zaditor years ago and is currently using a nose spray. [facility]. Best Number [phone]. Please advise

## 2023-03-17 NOTE — Telephone Encounter (Signed)
I called patient's parent and informed of note. I asked parent if she had any further questions and she did not. I informed to give [place] a callback if she has any other concerns.

## 2023-04-04 ENCOUNTER — Encounter: Payer: Self-pay | Admitting: Internal Medicine

## 2023-05-13 ENCOUNTER — Other Ambulatory Visit: Payer: Self-pay

## 2023-05-13 ENCOUNTER — Ambulatory Visit (INDEPENDENT_AMBULATORY_CARE_PROVIDER_SITE_OTHER): Payer: Medicaid Other | Admitting: Internal Medicine

## 2023-05-13 ENCOUNTER — Encounter: Payer: Self-pay | Admitting: Internal Medicine

## 2023-05-13 VITALS — BP 142/98 | HR 99 | Temp 98.7°F | Resp 16

## 2023-05-13 DIAGNOSIS — J302 Other seasonal allergic rhinitis: Secondary | ICD-10-CM | POA: Diagnosis not present

## 2023-05-13 DIAGNOSIS — L2089 Other atopic dermatitis: Secondary | ICD-10-CM

## 2023-05-13 DIAGNOSIS — J3089 Other allergic rhinitis: Secondary | ICD-10-CM | POA: Diagnosis not present

## 2023-05-13 MED ORDER — CETIRIZINE HCL 10 MG PO TABS: 10.0000 mg | ORAL_TABLET | Freq: Every day | ORAL | 5 refills | Status: DC

## 2023-05-13 MED ORDER — HYDROCORTISONE 2.5 % EX CREA: TOPICAL_CREAM | Freq: Two times a day (BID) | CUTANEOUS | 5 refills | Status: DC

## 2023-05-13 MED ORDER — FLUTICASONE PROPIONATE 50 MCG/ACT NA SUSP: 2.0000 | Freq: Every day | NASAL | 5 refills | Status: DC

## 2023-05-13 MED ORDER — KETOTIFEN FUMARATE 0.035 % OP SOLN: 1.0000 [drp] | Freq: Every day | OPHTHALMIC | 5 refills | Status: AC | PRN

## 2023-05-13 MED ORDER — TRIAMCINOLONE ACETONIDE 0.1 % EX OINT: TOPICAL_OINTMENT | CUTANEOUS | 5 refills | Status: DC

## 2023-05-13 NOTE — Patient Instructions (Addendum)
Allergic Rhinitis - sIgE 02/2023: positive for trees, grasses, weeds, cat, dog - Use Flonase (Fluticasone) 1 spray each nostril daily. Aim upward and outward. - Use Zyrtec (Cetirizine) 10 mg daily as needed for runny nose, sneezing, itchy watery eyes.  - For eyes, use Zaditor 1 eye drop daily as needed for itchy, watery eyes.  Available over the counter, if not covered by insurance.   Eczema:  - Do a daily soaking tub bath in warm water for 10-15 minutes.  - Use a gentle, unscented cleanser at the end of the bath (such as Dove unscented bar or baby wash, or Aveeno sensitive body wash). Then rinse, pat half-way dry, and apply a gentle, unscented moisturizer cream or ointment (Cerave, Cetaphil, Eucerin, Aveeno)  all over while still damp. Dry skin makes the itching worse. The skin should be moisturized with a gentle, unscented moisturizer at least twice daily.  - Use only unscented liquid laundry detergent. - Apply prescribed topical steroid (triamcinolone 0.1% below neck or for severe flare ups or hydrocortisone 2.5% above neck or for mild flare ups) to flared areas (red and thickened eczema) after the moisturizer has soaked into the skin (wait at least 30 minutes). Taper off the topical steroids as the skin improves. Do not use topical steroid for more than 7-10 days at a time.

## 2023-05-13 NOTE — Progress Notes (Signed)
FOLLOW UP Date of Service/Encounter:  05/13/23   Subjective:  Alyssa Wong (DOB: 08-16-1991) is a 32 y.o. female [facility] for follow up for allergic rhinitis and eczema.   History obtained from: chart review and patient and mother. Last visit was won 02/14/2023 and at the time sIgE was obtained which showed multiple allergens.  Also was having trouble with eczema like rash so started on topical steroids.   Rhinitis: Reports some stuffiness and loose clear drainage intermittently but doing well overall.  Mom using Zyrtec and Flonase PRN which is working well for her.  She also gets itchy watery eyes intermittently and has noted that Zaditor works better than Olopatadine.   Mom was concerned about mold in the house; I did reassure her allergy test did not show reactivity to that.     Rash: Doing much better.  Whenever she flares up, Mom uses the triamcinolone and it knows it out within a few days.  Does not need it frequently.  Has noted that it flares up more during Summer and with sweating; mostly on her back.   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 Physical Exam: GEN: alert, non talkative, spastic paresis in wheelchair   HEENT: clear conjunctiva, nose with mild inferior turbinate hypertrophy, pink nasal mucosa, slight clear rhinorrhea HEART: regular rate and rhythm, no murmur LUNGS: clear to auscultation bilaterally, no coughing, unlabored respiration SKIN: no rashes or lesions   Assessment:   1. Seasonal and  perennial allergic rhinitis   2. Other atopic dermatitis     Plan/Recommendations:   Allergic Rhinitis - Controlled - sIgE 02/2023: positive for trees, grasses, weeds, cat, dog - Use Flonase (Fluticasone) 1 spray each nostril daily. Aim upward and outward. - Use Zyrtec (Cetirizine) 10 mg daily as needed for runny nose, sneezing, itchy watery eyes.  - For eyes, use Zaditor 1 eye drop daily as needed for itchy, watery eyes.  Available over the counter, if not covered by insurance.   Eczema:  - Controlled - Do a daily soaking tub bath in warm water for 10-15 minutes.  - Use a gentle, unscented cleanser at the end of the bath (such as Dove unscented bar or baby wash, or Aveeno sensitive body wash). Then rinse, pat half-way dry, and apply a gentle, unscented moisturizer cream or ointment (Cerave, Cetaphil, Eucerin, Aveeno)  all over while still damp. Dry skin makes the itching worse. The skin should be moisturized with a gentle, unscented moisturizer at least twice daily.  - Use only unscented liquid laundry detergent. - Apply prescribed topical steroid (triamcinolone 0.1% below neck or for severe flare ups or hydrocortisone 2.5% above neck or for mild flare ups) to flared areas (red and thickened eczema) after the moisturizer has soaked into the skin (wait at least 30 minutes). Taper off the topical steroids as the skin improves. Do not use topical steroid for more than 7-10 days at a time.  Return in about 6 months (around 11/12/2023).  Alesia Morin, MD [facility]

## 2023-05-14 ENCOUNTER — Other Ambulatory Visit (INDEPENDENT_AMBULATORY_CARE_PROVIDER_SITE_OTHER): Payer: Self-pay

## 2023-05-14 ENCOUNTER — Other Ambulatory Visit (INDEPENDENT_AMBULATORY_CARE_PROVIDER_SITE_OTHER): Payer: Self-pay | Admitting: Family

## 2023-05-14 DIAGNOSIS — Z931 Gastrostomy status: Secondary | ICD-10-CM

## 2023-05-14 DIAGNOSIS — Q751 Craniofacial dysostosis: Secondary | ICD-10-CM

## 2023-05-14 DIAGNOSIS — G800 Spastic quadriplegic cerebral palsy: Secondary | ICD-10-CM

## 2023-05-14 DIAGNOSIS — E7220 Disorder of urea cycle metabolism, unspecified: Secondary | ICD-10-CM

## 2023-05-14 DIAGNOSIS — R748 Abnormal levels of other serum enzymes: Secondary | ICD-10-CM

## 2023-05-14 DIAGNOSIS — G9341 Metabolic encephalopathy: Secondary | ICD-10-CM

## 2023-05-14 DIAGNOSIS — K117 Disturbances of salivary secretion: Secondary | ICD-10-CM

## 2023-05-14 DIAGNOSIS — J302 Other seasonal allergic rhinitis: Secondary | ICD-10-CM

## 2023-05-14 MED ORDER — LEVOCARNITINE 1 GM/10ML PO SOLN
ORAL | 5 refills | Status: DC
Start: 2023-05-14 — End: 2023-07-17

## 2023-07-17 ENCOUNTER — Ambulatory Visit (INDEPENDENT_AMBULATORY_CARE_PROVIDER_SITE_OTHER): Payer: MEDICAID | Admitting: Family

## 2023-07-17 ENCOUNTER — Encounter (INDEPENDENT_AMBULATORY_CARE_PROVIDER_SITE_OTHER): Payer: Self-pay | Admitting: Family

## 2023-07-17 VITALS — BP 122/88 | HR 86 | Wt 103.6 lb

## 2023-07-17 DIAGNOSIS — F79 Unspecified intellectual disabilities: Secondary | ICD-10-CM

## 2023-07-17 DIAGNOSIS — M4140 Neuromuscular scoliosis, site unspecified: Secondary | ICD-10-CM

## 2023-07-17 DIAGNOSIS — G40309 Generalized idiopathic epilepsy and epileptic syndromes, not intractable, without status epilepticus: Secondary | ICD-10-CM

## 2023-07-17 DIAGNOSIS — Q743 Arthrogryposis multiplex congenita: Secondary | ICD-10-CM

## 2023-07-17 DIAGNOSIS — M24561 Contracture, right knee: Secondary | ICD-10-CM

## 2023-07-17 DIAGNOSIS — Q751 Craniofacial dysostosis: Secondary | ICD-10-CM

## 2023-07-17 DIAGNOSIS — E7220 Disorder of urea cycle metabolism, unspecified: Secondary | ICD-10-CM

## 2023-07-17 DIAGNOSIS — K117 Disturbances of salivary secretion: Secondary | ICD-10-CM

## 2023-07-17 DIAGNOSIS — R0689 Other abnormalities of breathing: Secondary | ICD-10-CM

## 2023-07-17 DIAGNOSIS — Z931 Gastrostomy status: Secondary | ICD-10-CM

## 2023-07-17 DIAGNOSIS — K148 Other diseases of tongue: Secondary | ICD-10-CM

## 2023-07-17 DIAGNOSIS — G9341 Metabolic encephalopathy: Secondary | ICD-10-CM

## 2023-07-17 DIAGNOSIS — Z982 Presence of cerebrospinal fluid drainage device: Secondary | ICD-10-CM

## 2023-07-17 DIAGNOSIS — G800 Spastic quadriplegic cerebral palsy: Secondary | ICD-10-CM | POA: Diagnosis not present

## 2023-07-17 DIAGNOSIS — F819 Developmental disorder of scholastic skills, unspecified: Secondary | ICD-10-CM

## 2023-07-17 DIAGNOSIS — R748 Abnormal levels of other serum enzymes: Secondary | ICD-10-CM

## 2023-07-17 MED ORDER — LEVOCARNITINE 1 GM/10ML PO SOLN: ORAL | 3 refills | Status: DC

## 2023-07-17 MED ORDER — LAMOTRIGINE 25 MG PO TABS
75.0000 mg | ORAL_TABLET | Freq: Two times a day (BID) | ORAL | 3 refills | Status: DC
Start: 2023-07-17 — End: 2023-12-31

## 2023-07-17 MED ORDER — GLYCOPYRROLATE 2 MG PO TABS
ORAL_TABLET | ORAL | 3 refills | Status: DC
Start: 2023-07-17 — End: 2023-12-31

## 2023-07-17 MED ORDER — LACTULOSE 10 GM/15ML PO SOLN
ORAL | 5 refills | Status: DC
Start: 2023-07-17 — End: 2023-12-31

## 2023-07-17 NOTE — Patient Instructions (Addendum)
It was a pleasure to see you today!  Instructions for you until your next appointment are as follows: I will send in 90 day refills on medications Please sign up for MyChart if you have not done so. Please plan to return for follow up in 6 months or sooner if needed.  Feel free to contact our office during normal business hours at [phone] with questions or concerns. If there is no answer or the call is outside business hours, please leave a message and our clinic staff will call you back within the next business day.  If you have an urgent concern, please stay on the line for our after-hours answering service and ask for the on-call neurologist.     I also encourage you to use MyChart to communicate with me more directly. If you have not yet signed up for MyChart within [facility], the front desk staff can help you. However, please note that this inbox is NOT monitored on nights or weekends, and response can take up to 2 business days.  Urgent matters should be discussed with the on-call pediatric neurologist.   At [facility], we are committed to providing exceptional care. You will receive a patient satisfaction survey through text or email regarding your visit today. Your opinion is important to me. Comments are appreciated.

## 2023-07-17 NOTE — Progress Notes (Signed)
Alyssa Wong   MRN:  [id number]  02-22-1991   Provider: [name] NP-C Location of Care: [facility]  Visit type: Return visit  Last visit: 01/08/2023  Referral source: [name], NP History from: Epic chart and patient's mother  Brief history:  Copied from previous record: History of Crouzon syndrome, generalized seizures, significant cognitive delay, spastic quadriparesis, VP shunt with history of revision, 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 She is taking and tolerating Lamotrigine and Levetiracetam for seziures. She has dysphagia and requires feedings by g-tube, with Osmolyte as her formula. She has had recurrent episodes of elevated ammonia levels requiring hospitalization. The initial increase in ammonia was thought to be related to Depakote but she has had further elevations in ammonia after the Depakote was discontinued in early May 2021.   Today's concerns: Mom reports that Alyssa Wong has had a few very brief seizures since her last visit.  She has been to see an allergist for seasonal allergies and Mom believes that the medications have helped.  She attends a day program and enjoys the activities there.  Mom wonders about getting a lightweight wheelchair for Alyssa Wong for short trips outside the home because her custom wheelchair is heavy and difficult to breakdown to go in the car. Alyssa Wong has been otherwise generally healthy since she was last seen. No health concerns today other than previously mentioned.  Review of systems: Please see HPI for neurologic and other pertinent review of systems. Otherwise all other systems were reviewed and were negative.  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 [facility])     Priority: High   Crouzon  syndrome     Priority: High   Ineffective airway clearance 01/08/2023   Nasal congestion 01/08/2023   Mold suspected exposure 01/08/2023   Seasonal allergies 03/04/2022   Conjunctivitis of left eye 03/04/2022   Excessive protrusion of tongue 04/19/2021   AKI (acute kidney injury) (HCC) 04/11/2021   Seizure (HCC) 03/28/2021   Metabolic encephalopathy 03/27/2020   SIRS (systemic inflammatory response syndrome) (HCC) 03/26/2020   Altered mental status 03/26/2020   Hypertensive urgency 03/26/2020   Status post insertion of percutaneous endoscopic gastrostomy (PEG) tube (HCC) 03/26/2020   Hyperammonemia (HCC) 03/26/2020   Lactic acidosis 03/26/2020   Loss of weight 07/17/2018   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   Drooling 03/04/2017   Encounter for long-term (current) use of high-risk medication 12/29/2013   Intellectual delay    S/P VP shunt    Arthrogryposis multiplex congenita    Contractures involving both knees    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   Intellectual disability 05/25/2012     Past Medical History:  Diagnosis Date   Arthrogryposis multiplex congenita    Contractures involving both knees    Crouzon syndrome    Intellectual delay    Seizures (HCC)    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 Had a tracheostomy as a child.   Surgical history: Past Surgical History:  Procedure Laterality Date   ELBOW SURGERY Left  Release of contractures   GASTROSTOMY TUBE PLACEMENT     HIP SURGERY       Family history: family history includes Allergic rhinitis in her maternal aunt and maternal uncle; Kidney disease in her  maternal grandfather.   Social history: Social History   Socioeconomic History   Marital status: Single    Spouse name: Not on file   Number of children: Not on file   Years of education: Not on file   Highest education level: Not on file  Occupational History   Not on file  Tobacco Use   Smoking status: Never    Passive exposure: Never   Smokeless tobacco: Never  Vaping Use   Vaping status: Never Used  Substance and Sexual Activity   Alcohol use: No   Drug use: No   Sexual activity: Never  Other Topics Concern   Not on file  Social History Narrative   Alyssa Wong is a young woman who graduated from [employer] with a certificate in 2013. She is enrolled in the day programs [employer] [facility] [employer]. She lives with her mother    Social Determinants of Health   Financial Resource Strain: Not on file  Food Insecurity: Not on file  Transportation Needs: Not on file  Physical Activity: Not on file  Stress: Not on file  Social Connections: Not on file  Intimate Partner Violence: Not on file    Past/failed meds: Copied from previous record: Depakote - elevated ammonia and altered mental status (d/c'd in May 2021)    Allergies: No Known Allergies   Immunizations: Immunization History  Administered Date(s) Administered   Hep A / Hep B 10/31/2021, 12/03/2021, 06/03/2022    Diagnostics/Screenings: Copied from previous record: 03/26/2020 - CT head wo contrast - 1. Unchanged position of right parietal approach shunt catheter with unchanged size and configuration of the ventricles. 2. No acute intracranial abnormality.   07/05/2006 - CT head wo contrast - Study technically limited to patient positioning and inability to straighten head. Right ventricular drain is in place without hydrocephalus. No definite acute intracranial abnormality   03/28/2020 - rEEG - This study showed evidence of epileptogenicity as well as cortical dysfunction in the right  temporoparietal region consistent with underlying craniotomy. Additionally, there is evidence of mild to moderate diffuse encephalopathy, nonspecific etiology but likely related to patient's history of cerebral palsy and intellectual delay. No seizures were seen throughout the recording.  Physical Exam: BP 122/88   Pulse 86   Wt 103 lb 9.6 oz (47 kg)   BMI 20.92 kg/m   General: short statured but well developed, well nourished, seated, in no evident distress Head: doliocephalic with exophthalmus, ocular hyperteorism and midface hypoplasia, along with tongue protrusion. Oropharynx difficult to examine but appears benign.  Neck: supple Cardiovascular: regular rate and rhythm, no murmurs. Respiratory: clear to auscultation bilaterally Abdomen: bowel sounds present all four quadrants, abdomen soft, non-tender, non-distended. No hepatosplenomegaly or masses palpated.Gastrostomy tube in place size Musculoskeletal: neuromuscular scoliosis. Her back is arched. She has spastic quadriparesis with arthrogryposis in the right upper extremity Skin: no rashes or neurocutaneous lesions  Neurologic Exam Mental Status: awake and fully alert. Has no language. Occasionally reaches out to take my hand. Unable to follow instructions or participate in examination Cranial Nerves: fundoscopic exam - red reflex present.  Unable to fully visualize fundus. Does not turn to localize faces and objects in the periphery. Turns to localize sounds in the periphery. Facial movements are asymmetric, has  lower facial weakness with drooling. Motor: spastic quadriparesis. Poor fine motor movements. Sensory: withdrawal x 4 Coordination: unable to adequately assess due to patient's inability to participate in examination. Does not reach for objects. Gait and Station: unable to stand and bear weight.   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) - Plan: glycopyrrolate (ROBINUL) 2 MG tablet  Generalized convulsive epilepsy (HCC) - Plan: lamoTRIgine (LAMICTAL) 25 MG tablet  Gastrostomy tube dependent (HCC) - Plan: lactulose (CHRONULAC) 10 GM/15ML solution, levOCARNitine (CARNITOR) 1 GM/10ML solution  Intellectual delay  S/P VP shunt  Arthrogryposis multiplex congenita  Contractures involving both knees  Drooling - Plan: glycopyrrolate (ROBINUL) 2 MG tablet  Excessive protrusion of tongue  Neuromuscular scoliosis, unspecified spinal region  Intellectual disability  Ineffective airway clearance  Metabolic encephalopathy - Plan: lactulose (CHRONULAC) 10 GM/15ML solution, levOCARNitine (CARNITOR) 1 GM/10ML solution  Hyperammonemia (HCC) - Plan: lactulose (CHRONULAC) 10 GM/15ML solution, levOCARNitine (CARNITOR) 1 GM/10ML solution  Elevated liver enzymes - Plan: lactulose (CHRONULAC) 10 GM/15ML solution, levOCARNitine (CARNITOR) 1 GM/10ML solution   Recommendations for plan of care: The patient's previous Epic records were reviewed. No recent diagnostic studies to be reviewed with the patient. I talked with Mom about the lightweight wheelchair and told her that my understanding is that insurance will pay for 1 chair every 5 years. She will decide if she wants to proceed with that because Neria needs her custom chair for the day program.  Plan until next visit: Continue medications as prescribed. Refills were issued Call for questions or concerns Return in about 6 months (around 01/17/2024).  The medication list was reviewed and reconciled. No changes were made in the prescribed medications today. A complete medication list was provided to the patient.  Allergies as of 07/17/2023   No Known Allergies      Medication List        Accurate as of July 17, 2023  7:53 PM. If you have any questions, ask your nurse or doctor.          cetirizine 10 MG  tablet Commonly known as: ZyrTEC Allergy Take 1 tablet (10 mg total) by mouth daily.   fluticasone 50 MCG/ACT nasal spray Commonly known as: FLONASE Place 2 sprays into both nostrils daily.   glycopyrrolate 2 MG tablet Commonly known as: ROBINUL TAKE 1 AND 1/2 TABLETS(3 MG) BY MOUTH TWICE DAILY   hydrocortisone 2.5 % cream Apply topically 2 (two) times daily. Apply twice daily for flare ups above neck, maximum 7 days.   ketotifen 0.035 % ophthalmic solution Commonly known as: ZADITOR Apply 1 drop to eye daily as needed (itchy watery eyes).   lactulose 10 GM/15ML solution Commonly known as: CHRONULAC Take 10ml by tube 3 times per day to produce 2 or 3 soft stools per day   lamoTRIgine 25 MG tablet Commonly known as: LAMICTAL Place 3 tablets (75 mg total) into feeding tube 2 (two) times daily.   levETIRAcetam 100 MG/ML solution Commonly known as: KEPPRA PLACE 10 ML INTO FEEDING TUBE TWICE DAILY   levOCARNitine 1 GM/10ML solution Commonly known as: CARNITOR TAKE 5 ML VIA FEEDING TUBE TWICE DAILY   levonorgestrel-ethinyl estradiol 0.15-0.03 MG tablet Commonly known as: SEASONALE Take 1 tablet by mouth daily.   loratadine 10 MG tablet Commonly known as: CLARITIN Take 10 mg by mouth daily as needed.   triamcinolone ointment 0.1 % Commonly known as: KENALOG Apply twice  daily for flare ups below neck, maximum 10 days.      Total time spent with the patient was 25 minutes, of which 50% or more was spent in counseling and coordination of care.  [name] NP-C [facility] [address] [place], [place] [address] Ph. [phone] Fax [phone]

## 2023-08-15 ENCOUNTER — Other Ambulatory Visit (INDEPENDENT_AMBULATORY_CARE_PROVIDER_SITE_OTHER): Payer: Self-pay | Admitting: Family

## 2023-08-15 DIAGNOSIS — G9341 Metabolic encephalopathy: Secondary | ICD-10-CM

## 2023-08-15 DIAGNOSIS — E7220 Disorder of urea cycle metabolism, unspecified: Secondary | ICD-10-CM

## 2023-08-15 DIAGNOSIS — R748 Abnormal levels of other serum enzymes: Secondary | ICD-10-CM

## 2023-08-15 DIAGNOSIS — Q751 Craniofacial dysostosis: Secondary | ICD-10-CM

## 2023-08-15 DIAGNOSIS — Z931 Gastrostomy status: Secondary | ICD-10-CM

## 2023-08-15 MED ORDER — LEVOCARNITINE 1 GM/10ML PO SOLN
ORAL | 5 refills | Status: DC
Start: 2023-08-15 — End: 2023-12-31

## 2023-10-20 ENCOUNTER — Other Ambulatory Visit (INDEPENDENT_AMBULATORY_CARE_PROVIDER_SITE_OTHER): Payer: Self-pay | Admitting: Family

## 2023-10-20 DIAGNOSIS — G40309 Generalized idiopathic epilepsy and epileptic syndromes, not intractable, without status epilepticus: Secondary | ICD-10-CM

## 2023-11-04 ENCOUNTER — Other Ambulatory Visit: Payer: Self-pay | Admitting: Internal Medicine

## 2023-11-12 ENCOUNTER — Other Ambulatory Visit: Payer: Self-pay

## 2023-11-12 ENCOUNTER — Encounter: Payer: Self-pay | Admitting: Internal Medicine

## 2023-11-12 ENCOUNTER — Ambulatory Visit: Payer: MEDICAID | Admitting: Internal Medicine

## 2023-11-12 VITALS — HR 109 | Temp 98.1°F | Resp 12

## 2023-11-12 DIAGNOSIS — L2089 Other atopic dermatitis: Secondary | ICD-10-CM

## 2023-11-12 DIAGNOSIS — J3089 Other allergic rhinitis: Secondary | ICD-10-CM | POA: Diagnosis not present

## 2023-11-12 DIAGNOSIS — J302 Other seasonal allergic rhinitis: Secondary | ICD-10-CM | POA: Diagnosis not present

## 2023-11-12 MED ORDER — CETIRIZINE HCL 10 MG PO TABS: 10.0000 mg | ORAL_TABLET | Freq: Every day | ORAL | 11 refills | Status: DC

## 2023-11-12 MED ORDER — TRIAMCINOLONE ACETONIDE 0.1 % EX OINT: TOPICAL_OINTMENT | CUTANEOUS | 5 refills | Status: DC

## 2023-11-12 MED ORDER — FLUTICASONE PROPIONATE 50 MCG/ACT NA SUSP: 1.0000 | Freq: Every day | NASAL | 11 refills | Status: DC

## 2023-11-12 MED ORDER — HYDROCORTISONE 2.5 % EX CREA: TOPICAL_CREAM | CUTANEOUS | 5 refills | Status: DC

## 2023-11-12 NOTE — Progress Notes (Signed)
FOLLOW UP Date of Service/Encounter:  11/12/23   Subjective:  Alyssa Wong (DOB: 1991-06-07) is a 32 y.o. female who [facility]23 for follow up for allergic rhinitis and eczema   History obtained from: chart review and patient and mother. Last visit was with me on 05/13/2023 and at the time, she was doing well on Flonase, Zyrtec, Zaditor and PRN topical steroids.  Since last visit, Mom reports her allergies are controlled as long as they take the medications.  Mom uses the Flonase daily and then Zyrtec/Zatidor PRN.  She did find Zatidor to be expensive but works better than Olopatadine.  Discussed trying generic or order off [employer].  With the rashes, usually worse during summer.  Did have a mild outbreak on her chest a few weeks ago with slight redness and dryness. Used topical triamcinolone with resolution in 2 days.  Does not need this frequently.  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 Physical Exam: GEN: alert, in wheelchair, non talkative, spastic paresis  HEENT: clear conjunctiva, nose without rhinorrhea, slight turbinate hypertrophy  HEART: regular rate and rhythm, no murmur LUNGS: clear to auscultation bilaterally, no coughing, unlabored respiration SKIN: no apparent rashes or lesions  Assessment:   1. Other atopic dermatitis   2. Seasonal and perennial allergic rhinitis     Plan/Recommendations:  Allergic Rhinitis - Controlled  - sIgE 02/2023: positive for trees, grasses, weeds, cat, dog - Use saline spray with suction if  needed prior to Flonase.   - Use Flonase (Fluticasone) 1 spray each nostril daily. Aim upward and outward. - Use Zyrtec (Cetirizine) 10 mg daily as needed for runny nose, sneezing, itchy watery eyes.  - For eyes, use Zaditor/Ketotifen 1 eye drop daily as needed for itchy, watery eyes.  Available over the counter, if not covered by insurance.   Eczema:  - Controlled  - Do a daily soaking tub bath in warm water for 10-15 minutes.  - Use a gentle, unscented cleanser at the end of the bath (such as Dove unscented bar or baby wash, or Aveeno sensitive body wash). Then rinse, pat half-way dry, and apply a gentle, unscented moisturizer cream or ointment (Cerave, Cetaphil, Eucerin, Aveeno)  all over while still damp. Dry skin makes the itching worse. The skin should be moisturized with a gentle, unscented moisturizer at least twice daily.  - Use only unscented liquid laundry detergent. - Apply prescribed topical steroid (triamcinolone 0.1% below neck or for severe flare ups or hydrocortisone 2.5% above neck or for mild flare ups) to flared areas (red and thickened eczema) after the moisturizer has soaked into the skin (wait at least 30 minutes). Taper off the topical steroids as the skin improves. Do not use topical steroid for more than 7-10 days at a time.    [facility] [address] [place],  [place]  [address] Main: [phone]      Return in about 1 year (around 11/11/2024).  Alesia Morin, MD [facility]

## 2023-11-12 NOTE — Patient Instructions (Addendum)
Allergic Rhinitis - sIgE 02/2023: positive for trees, grasses, weeds, cat, dog - Use saline spray with suction if needed prior to Flonase.   - Use Flonase (Fluticasone) 1 spray each nostril daily. Aim upward and outward. - Use Zyrtec (Cetirizine) 10 mg daily as needed for runny nose, sneezing, itchy watery eyes.  - For eyes, use Zaditor/Ketotifen 1 eye drop daily as needed for itchy, watery eyes.  Available over the counter, if not covered by insurance.   Eczema:  - Do a daily soaking tub bath in warm water for 10-15 minutes.  - Use a gentle, unscented cleanser at the end of the bath (such as Dove unscented bar or baby wash, or Aveeno sensitive body wash). Then rinse, pat half-way dry, and apply a gentle, unscented moisturizer cream or ointment (Cerave, Cetaphil, Eucerin, Aveeno)  all over while still damp. Dry skin makes the itching worse. The skin should be moisturized with a gentle, unscented moisturizer at least twice daily.  - Use only unscented liquid laundry detergent. - Apply prescribed topical steroid (triamcinolone 0.1% below neck or for severe flare ups or hydrocortisone 2.5% above neck or for mild flare ups) to flared areas (red and thickened eczema) after the moisturizer has soaked into the skin (wait at least 30 minutes). Taper off the topical steroids as the skin improves. Do not use topical steroid for more than 7-10 days at a time.    [facility] [address] [place],  [place]  [address] Main: [phone]

## 2023-12-31 ENCOUNTER — Encounter (INDEPENDENT_AMBULATORY_CARE_PROVIDER_SITE_OTHER): Payer: Self-pay | Admitting: Family

## 2023-12-31 ENCOUNTER — Ambulatory Visit (INDEPENDENT_AMBULATORY_CARE_PROVIDER_SITE_OTHER): Payer: MEDICAID | Admitting: Family

## 2023-12-31 VITALS — BP 130/78 | Wt 105.0 lb

## 2023-12-31 DIAGNOSIS — M24561 Contracture, right knee: Secondary | ICD-10-CM

## 2023-12-31 DIAGNOSIS — G9341 Metabolic encephalopathy: Secondary | ICD-10-CM | POA: Diagnosis not present

## 2023-12-31 DIAGNOSIS — R748 Abnormal levels of other serum enzymes: Secondary | ICD-10-CM

## 2023-12-31 DIAGNOSIS — K117 Disturbances of salivary secretion: Secondary | ICD-10-CM

## 2023-12-31 DIAGNOSIS — Q751 Craniofacial dysostosis: Secondary | ICD-10-CM

## 2023-12-31 DIAGNOSIS — Q743 Arthrogryposis multiplex congenita: Secondary | ICD-10-CM

## 2023-12-31 DIAGNOSIS — Z931 Gastrostomy status: Secondary | ICD-10-CM

## 2023-12-31 DIAGNOSIS — G800 Spastic quadriplegic cerebral palsy: Secondary | ICD-10-CM | POA: Diagnosis not present

## 2023-12-31 DIAGNOSIS — M24562 Contracture, left knee: Secondary | ICD-10-CM

## 2023-12-31 DIAGNOSIS — M4140 Neuromuscular scoliosis, site unspecified: Secondary | ICD-10-CM

## 2023-12-31 DIAGNOSIS — E7220 Disorder of urea cycle metabolism, unspecified: Secondary | ICD-10-CM

## 2023-12-31 DIAGNOSIS — G40309 Generalized idiopathic epilepsy and epileptic syndromes, not intractable, without status epilepticus: Secondary | ICD-10-CM

## 2023-12-31 MED ORDER — LEVOCARNITINE 1 GM/10ML PO SOLN: ORAL | 5 refills | Status: DC

## 2023-12-31 MED ORDER — LACTULOSE 10 GM/15ML PO SOLN: ORAL | 5 refills | Status: DC

## 2023-12-31 MED ORDER — LAMOTRIGINE 25 MG PO TABS: 75.0000 mg | ORAL_TABLET | Freq: Two times a day (BID) | ORAL | 3 refills | Status: DC

## 2023-12-31 MED ORDER — GLYCOPYRROLATE 2 MG PO TABS
ORAL_TABLET | ORAL | 3 refills | Status: DC
Start: 2023-12-31 — End: 2024-07-05

## 2023-12-31 MED ORDER — LEVETIRACETAM 100 MG/ML PO SOLN: ORAL | 3 refills | Status: DC

## 2023-12-31 NOTE — Progress Notes (Signed)
 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 She is taking and tolerating Lamotrigine  and Levetiracetam  for seziures. She has dysphagia and requires feedings by g-tube, with Osmolyte as her formula. She has had recurrent episodes of elevated ammonia levels requiring hospitalization. The initial increase in ammonia was thought to be related to Depakote but she has had further elevations in ammonia after the Depakote was discontinued in early May 2021.  She sees an allergist for seasonal allergies.  Today's concerns: Mom reports that Alyssa Wong has remained seizure free since her last visit.  She attends a day program and enjoys the activities, but becomes restless and uncomfortable sitting in her wheelchair for the length of the day. Mom believes the wheelchair is 57 years old and is interested in a new wheelchair for Alyssa Wong that would fit better and help her to be more comfortable for long days. Mom also finds the wheelchair difficult to break down for transports in the car.  Alyssa Wong has been otherwise generally healthy since she was last seen. No health concerns today other than previously mentioned.  Review of systems: Please see HPI for neurologic and other pertinent review of systems. Otherwise all other systems were reviewed and were negative.  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 [facility])     Priority: High   Crouzon syndrome     Priority: High   Ineffective airway clearance 01/08/2023   Nasal congestion 01/08/2023   Mold suspected exposure 01/08/2023   Seasonal allergies 03/04/2022   Conjunctivitis of left eye 03/04/2022   Excessive protrusion of tongue 04/19/2021   AKI (acute kidney injury) (HCC) 04/11/2021   Seizure (HCC) 03/28/2021   Metabolic encephalopathy 03/27/2020   SIRS (systemic inflammatory response syndrome) (HCC) 03/26/2020   Altered mental status 03/26/2020   Hypertensive urgency 03/26/2020   Status post insertion of percutaneous endoscopic gastrostomy (PEG) tube (HCC) 03/26/2020   Hyperammonemia (HCC) 03/26/2020   Lactic acidosis 03/26/2020   Loss of weight 07/17/2018   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   Drooling 03/04/2017   Encounter for long-term (current) use of high-risk medication 12/29/2013   Intellectual delay    S/P VP shunt    Arthrogryposis multiplex congenita    Contractures involving both knees    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   Intellectual disability 05/25/2012     Past Medical History:  Diagnosis Date   Arthrogryposis multiplex congenita    Contractures involving both knees    Crouzon syndrome    Intellectual delay    Seizures (HCC)    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 Had a tracheostomy as a child.  Surgical history: Past Surgical History:  Procedure Laterality Date   ELBOW SURGERY Left    Release of contractures   GASTROSTOMY TUBE PLACEMENT     HIP SURGERY      Family history: family history includes  Allergic rhinitis in her maternal aunt and maternal uncle; Kidney disease in her maternal grandfather.   Social history: Social History   Socioeconomic History   Marital status: Single    Spouse name: Not on file   Number of children: Not on file   Years of education: Not on file   Highest education level: Not on file  Occupational History   Not on file  Tobacco Use   Smoking status: Never    Passive exposure: Never   Smokeless tobacco: Never  Vaping Use   Vaping status: Never Used  Substance and Sexual Activity   Alcohol use: No   Drug use: No   Sexual activity: Never  Other Topics Concern   Not on file  Social History Narrative   Alyssa Wong is a young woman who graduated from [employer] with a certificate in 2013. She is enrolled in the day programs [employer] [facility] [employer]. She lives with her mother    Social Drivers of Corporate Investment Banker Strain: Not on file  Food Insecurity: Not on file  Transportation Needs: Not on file  Physical Activity: Not on file  Stress: Not on file  Social Connections: Not on file  Intimate Partner Violence: Not on file    Past/failed meds: Copied from previous record: Depakote - elevated ammonia and altered mental status (d/c'd in May 2021)   Allergies: Allergies  Allergen Reactions   Valproic  Acid And Related Other (See Comments)    Immunizations: Immunization History  Administered Date(s) Administered   Hep A / Hep B 10/31/2021, 12/03/2021, 06/03/2022   Diagnostics/Screenings: Copied from previous record: 03/26/2020 - CT head wo contrast - 1. Unchanged position of right parietal approach shunt catheter with unchanged size and configuration of the ventricles. 2. No acute intracranial abnormality.   07/05/2006 - CT head wo contrast - Study technically limited to patient positioning and inability to straighten head. Right ventricular drain is in place without hydrocephalus. No definite acute  intracranial abnormality   03/28/2020 - rEEG - This study showed evidence of epileptogenicity as well as cortical dysfunction in the right temporoparietal region consistent with underlying craniotomy. Additionally, there is evidence of mild to moderate diffuse encephalopathy, nonspecific etiology but likely related to patient's history of cerebral palsy and intellectual delay. No seizures were seen throughout the recording.  Physical Exam: BP 130/78 (BP Location: Right Arm, Patient Position: Sitting, Cuff Size: Small)   Wt 105 lb (47.6 kg)   BMI 21.21 kg/m   General: short statured but well developed, well nourished woman, seated in wheelchair, in no evident distress Head: doliocephalic with exothalmus, ocular hypertelorism and midface hypoplasia, tongue protrusion. Oropharynx difficult to examine but appears otherwise benign.  Neck: supple Cardiovascular: regular rate and rhythm, no murmurs. Respiratory: clear to auscultation bilaterally Abdomen: bowel sounds present all four quadrants, abdomen soft, non-tender, non-distended. No hepatosplenomegaly or masses palpated.Gastrostomy tube in place, site clean and dry Musculoskeletal: neuromuscular scoliosis. Back is arched. Has spastic quadriparesis with arthrogryposis in the right upper extremity Skin: no rashes or neurocutaneous lesions  Neurologic Exam Mental Status: awake and fully alert. Has no language. Unable to follow instructions or participate in examination Cranial Nerves: fundoscopic exam - red reflex present.  Unable to fully  visualize fundus.  Pupils equal briskly reactive to light. Does not turn to localize faces and objects in the periphery. Turns to localize sounds in the periphery. Facial movements are asymmetric, has lower facial weakness with drooling.  Motor: spastic quadriparesis. Poor fine motor movements Sensory: withdrawal x 4 Coordination: unable to adequately assess due to patient's inability to participate in  examination. Dysmetria with reach for objects. Gait and Station: unable to stand and bear weight.  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) - Plan: glycopyrrolate  (ROBINUL ) 2 MG tablet, Ambulatory Referral for DME  Drooling - Plan: glycopyrrolate  (ROBINUL ) 2 MG tablet  Gastrostomy tube dependent (HCC) - Plan: lactulose  (CHRONULAC ) 10 GM/15ML solution, levOCARNitine  (CARNITOR ) 1 GM/10ML solution  Metabolic encephalopathy - Plan: lactulose  (CHRONULAC ) 10 GM/15ML solution, levOCARNitine  (CARNITOR ) 1 GM/10ML solution  Hyperammonemia (HCC) - Plan: lactulose  (CHRONULAC ) 10 GM/15ML solution, levOCARNitine  (CARNITOR ) 1 GM/10ML solution  Elevated liver enzymes - Plan: lactulose  (CHRONULAC ) 10 GM/15ML solution, levOCARNitine  (CARNITOR ) 1 GM/10ML solution  Generalized convulsive epilepsy (HCC) - Plan: lamoTRIgine  (LAMICTAL ) 25 MG tablet, levETIRAcetam  (KEPPRA ) 100 MG/ML solution  Arthrogryposis multiplex congenita - Plan: Ambulatory Referral for DME  Contractures involving both knees - Plan: Ambulatory Referral for DME  Neuromuscular scoliosis, unspecified spinal region - Plan: Ambulatory Referral for DME   Recommendations for plan of care: The patient's previous Epic records were reviewed. No recent diagnostic studies to be reviewed with the patient.  Plan until next visit: Continue medications as prescribed  Referral placed for new wheelchair Call for questions or concerns Return in about 6 months (around 06/29/2024).  The medication list was reviewed and reconciled. No changes were made in the prescribed medications today. A complete medication list was provided to the patient.  Orders Placed This Encounter  Procedures   Ambulatory Referral for DME    Referral Priority:   Routine    Referral Type:   Durable Medical  Equipment Purchase    Number of Visits Requested:   1   Allergies as of 12/31/2023       Reactions   Valproic  Acid And Related Other (See Comments)        Medication List        Accurate as of December 31, 2023 11:59 PM. If you have any questions, ask your nurse or doctor.          cetirizine  10 MG tablet Commonly known as: ZyrTEC  Allergy Take 1 tablet (10 mg total) by mouth daily.   fluticasone  50 MCG/ACT nasal spray Commonly known as: FLONASE  Place 1 spray into both nostrils daily.   glycopyrrolate  2 MG tablet Commonly known as: ROBINUL  TAKE 1 AND 1/2 TABLETS(3 MG) BY MOUTH TWICE DAILY   hydrocortisone  2.5 % cream Apply twice daily for flare ups above neck, maximum 7 days.   ketotifen  0.035 % ophthalmic solution Commonly known as: ZADITOR  Apply 1 drop to eye daily as needed (itchy watery eyes).   lactulose  10 GM/15ML solution Commonly known as: CHRONULAC  Take 10ml by tube 3 times per day to produce 2 or 3 soft stools per day   lamoTRIgine  25 MG tablet Commonly known as: LAMICTAL  Place 3 tablets (75 mg total) into feeding tube 2 (two) times daily.   levETIRAcetam  100 MG/ML solution Commonly known as: KEPPRA  PLACE 10 ML IN TO FEEDING TUBE TWICE DAILY   levOCARNitine  1 GM/10ML solution Commonly known as: CARNITOR  TAKE 5 ML  VIA FEEDING TUBE TWICE DAILY   levonorgestrel-ethinyl estradiol 0.15-0.03 MG tablet Commonly known as: SEASONALE Take 1 tablet by mouth daily.   loratadine 10 MG tablet Commonly known as: CLARITIN Take 10 mg by mouth daily as needed.   triamcinolone  ointment 0.1 % Commonly known as: KENALOG  Apply twice daily for flare ups below neck, maximum 10 days.      Total time spent with the patient was 25 minutes, of which 50% or more was spent in counseling and coordination of care.  Ellouise Bollman NP-C [facility] [address] [place], [place] [address] Ph. [phone] Fax  [phone]

## 2024-01-01 ENCOUNTER — Encounter (INDEPENDENT_AMBULATORY_CARE_PROVIDER_SITE_OTHER): Payer: Self-pay | Admitting: Family

## 2024-01-01 DIAGNOSIS — R748 Abnormal levels of other serum enzymes: Secondary | ICD-10-CM | POA: Insufficient documentation

## 2024-01-01 NOTE — Patient Instructions (Signed)
 It was a pleasure to see you today!  Instructions for you until your next appointment are as follows: Continue Novella's medications as prescribed Let me know if any seizures occur or if you have any other concerns I placed a referral for a new wheelchair. This will be sent to [employer].They will contact you about the process for a new chair.  Please sign up for MyChart if you have not done so. Please plan to return for follow up in 6 months or sooner if needed.  Feel free to contact our office during normal business hours at [phone] with questions or concerns. If there is no answer or the call is outside business hours, please leave a message and our clinic staff will call you back within the next business day.  If you have an urgent concern, please stay on the line for our after-hours answering service and ask for the on-call neurologist.     I also encourage you to use MyChart to communicate with me more directly. If you have not yet signed up for MyChart within [facility], the front desk staff can help you. However, please note that this inbox is NOT monitored on nights or weekends, and response can take up to 2 business days.  Urgent matters should be discussed with the on-call pediatric neurologist.   At [facility], we are committed to providing exceptional care. You will receive a patient satisfaction survey through text or email regarding your visit today. Your opinion is important to me. Comments are appreciated.

## 2024-01-06 ENCOUNTER — Encounter (INDEPENDENT_AMBULATORY_CARE_PROVIDER_SITE_OTHER): Payer: Self-pay

## 2024-02-04 ENCOUNTER — Telehealth (INDEPENDENT_AMBULATORY_CARE_PROVIDER_SITE_OTHER): Payer: Self-pay | Admitting: Family

## 2024-02-04 NOTE — Telephone Encounter (Signed)
  Name of who is calling: Alyssa Wong  Caller's Relationship to Patient: mom  Best contact number: [phone]  Provider they see: [name]  Reason for call: Mom calling bc she gets supplies from [employer] and they sent a fax order for Alyssa Wong to sign and send back to them so she can get her supplies for the rest of the year. Mom is calling to touch bases and refer that message.      PRESCRIPTION REFILL ONLY  Name of prescription:  Pharmacy:

## 2024-02-20 ENCOUNTER — Ambulatory Visit: Payer: MEDICAID | Attending: Family

## 2024-02-20 DIAGNOSIS — R278 Other lack of coordination: Secondary | ICD-10-CM | POA: Insufficient documentation

## 2024-02-20 DIAGNOSIS — R293 Abnormal posture: Secondary | ICD-10-CM | POA: Insufficient documentation

## 2024-02-20 DIAGNOSIS — R262 Difficulty in walking, not elsewhere classified: Secondary | ICD-10-CM | POA: Insufficient documentation

## 2024-02-20 DIAGNOSIS — M6281 Muscle weakness (generalized): Secondary | ICD-10-CM | POA: Insufficient documentation

## 2024-02-20 NOTE — Therapy (Signed)
 OUTPATIENT PHYSICAL THERAPY WHEELCHAIR EVALUATION   Patient Name: Alyssa Wong MRN: [id number] DOB:03/13/1991, 33 y.o., female Today's Date: 02/20/2024  END OF SESSION:  PT End of Session - 02/20/24 1445     Visit Number 1    Number of Visits 1    Authorization Type [employer]    PT Start Time 1445    PT Stop Time 1530    PT Time Calculation (min) 45 min    Activity Tolerance Patient tolerated treatment well;Patient limited by fatigue    Behavior During Therapy [facility] for tasks assessed/performed;Restless             Past Medical History:  Diagnosis Date   Arthrogryposis multiplex congenita    Contractures involving both knees    Crouzon syndrome    Intellectual delay    Seizures (HCC)    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)    VP (ventriculoperitoneal) shunt status    Past Surgical History:  Procedure Laterality Date   ELBOW SURGERY Left    Release of contractures   GASTROSTOMY TUBE PLACEMENT     HIP SURGERY     Patient Active Problem List   Diagnosis Date Noted   Elevated liver enzymes 01/01/2024   Ineffective airway clearance 01/08/2023   Nasal congestion 01/08/2023   Mold suspected exposure 01/08/2023   Seasonal allergies 03/04/2022   Conjunctivitis of left eye 03/04/2022   Excessive protrusion of tongue 04/19/2021   AKI (acute kidney injury) (HCC) 04/11/2021   Seizure (HCC) 03/28/2021   Metabolic encephalopathy 03/27/2020   SIRS (systemic inflammatory response syndrome) (HCC) 03/26/2020   Altered mental status 03/26/2020   Hypertensive urgency 03/26/2020   Status post insertion of percutaneous endoscopic gastrostomy (PEG) tube (HCC) 03/26/2020   Hyperammonemia (HCC) 03/26/2020   Lactic acidosis 03/26/2020   Loss of weight 07/17/2018   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   Drooling 03/04/2017   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   Encounter for long-term (current) use of high-risk medication  12/29/2013   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 Her current wheelchair is 35 years old.   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; Q74.3- Arthrogryposis multiplex congenita; M41.40- Neuromuscular Scoliosis, site unspecified   [] Progressive disease  Relevant future surgeries:   None planned  Height: 4'11" Weight: 95# Explain recent changes or trends in weight:      History:  Past Medical History:  Diagnosis Date   Arthrogryposis multiplex congenita    Contractures involving both knees    Crouzon syndrome    Intellectual delay    Seizures (HCC)    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)    VP (ventriculoperitoneal) shunt status        Cardio Status:  Functional Limitations:   [x] Intact  []  Impaired      Respiratory Status:  Functional Limitations:   [x] Intact  [] Impaired   [] SOB [] COPD  [] O2 Dependent ______LPM  [] Ventilator Dependent  Resp equip:                                                     Objective Measure(s):   Orthotics:   [] Amputee:                                                             [] Prosthesis:      HOME ENVIRONMENT:  [x] House [] Condo/town home [] Apartment [] Asst living [] LTCF         [x] Own  [] Rent   [x] Lives alone [] Lives with others -                             Hours without assistance: 0  [x] Home is accessible to patient                                 Storage of wheelchair:  [x] In home   [] Other Comments:       COMMUNITY :  TRANSPORTATION:  [x] Car [] Van [x] Public Transportation [] Adapted w/c Lift []  Ambulance [] Other:                     [] Sits in wheelchair during transport   Where is w/c stored during transport? Trunk when transporting in car [x] Tie Downs  []  EZ Lock  r   [] Self-Driver       Drive while in  Biomedical scientist [] yes [x] no   Employment and/or school:  Specific requirements pertaining to mobility  M-F participates in [employer]       Other:  COMMUNICATION:  Verbal Communication  [] WFL [] receptive [] WFL [] expressive [] Understandable  [] Difficult to understand  [x] non-communicative  Primary Language:_____english_________ 2nd:_____________  Communication provided by:[] Patient [x] Family [] Caregiver [] Translator   [] Uses an augmentative communication device     Manufacturer/Model :  MOBILITY/BALANCE:  Sitting Balance  Standing Balance  Transfers  Ambulation   [] WFL      [] WFL  [] Independent  []  Independent   [] Uses UE for balance in sitting Comments:  [] Uses UE/device for stability Comments:  [x]  Min assist  []  Ambulates independently with       device:___________________      []  Mod assist  []  Able to ambulate ______ feet        safely/functionally/independently   []  Min assist  [x]  Min assist  [x]  Max assist  [x]  Non-functional ambulator         History/High risk of falls   [x]  Mod assist  []  Mod assist  []   Dependent  []  Unable to ambulate   []  Max  assist  []  Max assist  Transfer method:[x] 1 person [] 2 person [] sliding board [] squat pivot [] stand pivot [] mechanical patient lift  [] other:   []  Unable  []  Unable    Fall History: # of falls in the past 6 months? 0 # of "near" falls in the past 6 months? 0    CURRENT SEATING / MOBILITY:  Current Mobility Device: [] None [] Cane/Walker [x] Manual [] Dependent [] Dependent w/ Tilt rScooter  [] Power (type of control):   Manufacturer:  Model:  Serial #:   Size:  Color:  Age:   Purchased by whom:   Current condition of mobility base:    Current seating system:                                                                       Age of seating system:    Describe posture in present seating system:    Is the current mobility meeting medical necessity?:  [] Yes [x] No Describe: too heavy and in poor condition for patients positioning needs     Ability to complete Mobility-Related Activities of Daily Living (MRADL's) with Current Mobility Device:   Move room to room  [] Independent  [] Min [] Mod [x] Max assist  [] Unable  Comments: Patient receives tube feeds through G-tube  Meal prep  [] Independent  [] Min [] Mod [] Max assist  [x] Unable    Feeding  [] Independent  [] Min [] Mod [] Max assist  [x] Unable    Bathing  [] Independent  [] Min [] Mod [x] Max assist  [] Unable    Grooming  [] Independent  [] Min [] Mod [x] Max assist  [] Unable    UE dressing  [] Independent  [] Min [] Mod [x] Max assist  [] Unable    LE dressing  [] Independent   [] Min [] Mod [x] Max assist  [] Unable    Toileting  [] Independent  [] Min [] Mod [x] Max assist  [] Unable    Bowel Mgt: []  Continent [x]  Incontinent []  Accidents []  Diapers []  Colostomy []  Bowel Program:  Bladder Mgt: []  Continent [x]  Incontinent []  Accidents []  Diapers []  Urinal []  Intermittent Cath []  Indwelling Cath []  Supra-pubic Cath     Current Mobility Equipment Trialed/ Ruled Out:    Does not meet mobility needs due to:    Loraine Leriche all boxes  that indicate inability to use the specific equipment listed     Meets needs for safe  independent functional  ambulation  / mobility    Risk of  Falling or History of Falls    Enviromental limitations      Cognition    Safety concerns  with  physical ability    Decreased / limitations endurance  & strength     Decreased / limitations  motor skills  & coordination    Pain    Pace /  Speed    Cardiac and/or  respiratory condition    Contra - indicated by diagnosis   Cane/Crutches  []   [x]   []   [x]   []   [x]   [x]   [x]   [x]   []   []    Walker / Rollator  []  NA   []   [x]   []   [x]   [x]   [x]   [x]   [x]   []   []   []     Manual Wheelchair [id number]:  []  NA  [x]   []   []   []   []   []   []   []   []   []   []    Manual W/C (K0005) with power assist  []  NA  []   []   []   []   []   []   []   []   []   []   []    Scooter  []  NA  []   []   []   []   []   []   []   []   []   []   []    Power Wheelchair: standard joystick  []  NA  []   []   []   []   []   []   []   []   []   []   []    Power Wheelchair: alternative controls  []  NA  []   []   []   []   []   []   []   []   []   []   []    Summary:  The least costly alternative for independent functional mobility was found to be:    []  Crutch/Cane  []  Walker [x]  Manual w/c  []  Manual w/c with power assist   []  Scooter   []  Power w/c std joystick   []  Power w/c alternative control        [x]  Requires dependent care mobility device   Cabin crew for [employer] skills are adequate for safe mobility equipment operation  [x]   Yes []   No  Patient is willing and motivated to use recommended mobility equipment  [x]   Yes []   No       [x]  Patient is unable to safely operate mobility equipment independently and requires dependent care equipment Comments: Given patients significant intellectual disabilities and communication deficits, she requires a caregiver to provide all mobility           SENSATION and SKIN ISSUES:  Sensation [x]  Intact  []  Impaired []  Absent []   Hyposensate []  Hypersensate  [x]  Defensiveness  Location(s) of impairment:    Pressure Relief Method(s):  []  Lean side to side to offload (without risk of falling)  []   W/C push up (4+ times/hour for 15+ seconds) []  Stand up (without risk of falling)    []  Other: (Describe): Effective pressure relief method(s) above can be performed consistently throughout the day: [] Yes  [x]  No If not, Why?: Due to patients significant intellectual disabilities and communication deficits, she is unable to communicate developing soreness and she lacks the gross motor control and strength to complete a pressure relief independently.   Skin Integrity Risk:       []  Low risk           []  Moderate risk            [x]  High risk  If high risk, explain: She is unable to complete any pressure relief independently and is unable to communicate developing soreness or  need to reposition. She is also incontinent and is at greater risk for skin breakdown and maceration.   Skin Issues/Skin Integrity  Current skin Issues  []  Yes [x]  No []  Intact  []   Red area   []   Open area  []  Scar tissue  [x]  At risk from prolonged sitting  Where:coccyx, sacrum, ischium, posterior of head, scapular tips History of Skin Issues  []  Yes [x]  No Where : When: Stage: Hx of skin flap surgeries  []  Yes [x]  No Where:  When:  Pain: Patient is unable to functionally communicate her pain and is unable to quantify her pain levels.    MAT EVALUATION:  Neuro-Muscular Status: (Tone, Reflexive, Responses, etc.)     []   Intact   [x]  Spasticity:  []  Hypotonicity  []  Fluctuating  []  Muscle Spasms  [x]  Poor Righting Reactions/Poor Equilibrium Reactions  []  Primal Reflex(s):    Comments:            COMMENTS:    POSTURE:     Comments:  Pelvis Anterior/Posterior:  []  Neutral   []  Posterior  [x]  Anterior  [x]  Fixed - No movement []  Tendency away from neutral []  Flexible []  Self-correction []  External correction Obliquity (viewed  from front)  []  WFL [x]  R Obliquity []  L Obliquity  []  Fixed - No movement [x]  Tendency away from neutral []  Flexible []  Self-correction [x]  External correction Rotation  []  WFL [x]  R anterior []  L anterior  []  Fixed - No movement []  Tendency away from neutral []  Flexible []  Self-correction []  External correction Tonal Influence Pelvis:  []  Normal []  Flaccid []  Low tone [x]  Spasticity [x]  Dystonia []  Pelvis thrust []  Other:    Trunk Anterior/Posterior:  []  WFL []  Thoracic kyphosis [x]  Lumbar lordosis  [x]  Fixed - No movement []  Tendency away from neutral []  Flexible []  Self-correction []  External correction  []  WFL []  Convex to left  []  Convex to right []  S-curve   []  C-curve []  Multiple curves [x]  Tendency away from neutral []  Flexible []  Self-correction [x]  External correction Rotation of shoulders and upper trunk:  []  Neutral [x]  Left-anterior []  Right- anterior []  Fixed- no movement []  Tendency away from neutral []  Flexible []  Self correction [x]  External correction Tonal influence Trunk:  []  Normal []  Flaccid []  Low tone [x]  Spasticity [x]  Dystonia []  Other:   Head & Neck  []  Functional []  Flexed    []  Extended [x]  Rotated right  []  Rotated left []  Laterally flexed right [x]  Laterally flexed left [x]  Cervical hyperextension   []  Good head control []  Adequate head control [x]  Limited head control []  Absent head control Describe tone/movement of head and neck: Patient with poor head and neck righting reactions and general postural control   Lower Extremity Measurements: LE ROM:  Passive ROM Right 02/20/2024 Left 02/20/2024  Hip flexion    Hip extension    Hip abduction    Hip adduction    Knee flexion    Knee extension -~40* -~45*  Ankle dorsiflexion -~5* -~5*  Ankle plantarflexion     (Blank rows = not tested)  *patient not agreeable to formal ROM measurements due to poor sitting tolerance in current wheelchair/wanting  to get out of wheelchair   Hip positions:  [x]  Neutral   []  Abducted   []  Adducted  []  Subluxed   []  Dislocated   []  Fixed   []  Tendency away from neutral []  Flexible []  Self-correction []  External correction   Hip Windswept:[]  Neutral  []   Right    [x]  Left  []  Subluxed   []  Dislocated   [x]  Fixed   [x]  Tendency away from neutral []  Flexible []  Self-correction []  External correction  LE Tone: []  Normal []  Low tone [x]  Spasticity []  Flaccid [x]  Dystonia []  Rocks/Extends at hip []  Thrust into knee extension []  Pushes legs downward into footrest  Foot positioning: ROM Concerns: Dorsiflexed: [x]  Right   []  Left Plantar flexed: []  Right    [x]  Left Inversion: []  Right    []  Left Eversion: []  Right    []  Left  UE Measurements:  UPPER EXTREMITY ROM:   Passive ROM Right 02/20/2024 Left 02/20/2024  Shoulder flexion ~45* ~45*  Shoulder abduction ~45* ~45*  Shoulder adduction    Elbow flexion 90* contracture 90* contracture  Elbow extension    Wrist flexion    Wrist extension    (Blank rows = not tested) *Patient not agreeable to formal ROM measurements due to poor sitting tolerance in current wheelchair/wanting to get out of wheelchair  Shoulder Posture:  Right Tendency towards Left  []   Functional []    [x]   Elevation [x]    []   Depression []    [x]   Protraction []    []   Retraction [x]    [x]   Internal rotation [x]    []   External rotation []    []   Subluxed []     UE Tone: []  Normal []  Flaccid []  Low tone [x]  Spasticity  [x]  Dystonia []  Other:   Wrist/Hand: Handedness: []  Right   []  Left   [x]  NA: Comments:  Right  Left  []   WNL []    []   Limitations []    [x]   Contractures [x]    []   Fisting []    []   Tremors []    []   Weak grasp []    []   Poor dexterity []    [x]   Hand movement non functional [x]    []   Paralysis []     MOBILITY BASE RECOMMENDATIONS and JUSTIFICATION:  MOBILITY BASE  JUSTIFICATION   Manufacturer:   [employer] Model:                              [employer] FT Color:  Seat Width:  16" Seat Depth 17"   [x]  Manual mobility base (continue below)   []  Scooter/POV  []  Power mobility base   Number of hours per day spent in above selected mobility base: ~8  Typical daily mobility base use Schedule: Patient will transfer into her wheelchair with assistance to allow for    [x]  is not a safe, functional ambulator  [x]  limitation prevents from completing a MRADL(s) within a reasonable time frame    [x]  limitation places at high risk of morbidity or mortality secondary to  the attempts to perform a    MRADL(s)  [x]  limitation prevents accomplishing a MRADL(s) entirely  []  provide independent mobility  [x]  equipment is a lifetime medical need  [x]  walker or cane inadequate  [x]  any type manual wheelchair      inadequate  [x]  scooter/POV inadequate      [x]  requires dependent mobility          MANUAL MOBILITY      []  Standard manual wheelchair  K0001      Arm:    []  both []  right  []  left      Foot:   []  both []  right   []  left  []  self-propels wheelchair  []  will use on  regular basis  []  chair fits throughout home  []  willing and motivated to use  []  propels with assistance     []  dependent use   []  Standard hemi-manual wheelchair  K0002      Arm:    []  both []  right  []  left      Foot:   []  both []  right   []  left  []  lower seat height required to foot propel  []  short stature  []  self-propels wheelchair  []  will use on regular basis  []  chair fits throughout home  []  willing and motivated to use   []  propels with assistance  []  dependent use   []  Lightweight manual wheelchair  K0003      Arm:    []  both []  right  []  left      Foot:   []  both  []  right  []  left                   []  hemi height required  []  medical condition and weight of  wheelchair affect ability to self      propel standard manual wheelchair in the residence  []  can and does self-propel (marginal propulsion skills)  []  daily use _________hours   []  chair fits throughout home  []  willing and motivated to use  []  lower seat height required to foot propel  []  short stature   []  High strength lightweight manual  wheelchair (Breezy Ultra 4)  K0004     Arm:    []  both []  right  []  left     Foot:   []  both []  right   []  left                                                                  []  hemi height required []  medical condition and weight of wheelchair affect ability to self propel while engaging in frequent MRADL(s) that cannot be performed in a standard or lightweight manual wheelchair  []  daily use _________hours  []  chair fits throughout home  []  willing and motivated to use  []  prevent repetitive use injuries   []  lower seat height required to foot propel  []  short stature     []  Power assist Comments:  []  prevent repetitive use injuries  []  repetitive strain injury present in    shoulder girdle    []  shoulder pain is (> or =) to 7/10     during manual propulsion       Current Pain _____/10  []  requires conservation of energy to participate in MRADL(s) runable to propel up ramps or curbs using manual wheelchair  []  been K0005 user greater than one year  []  user unwilling to use power      wheelchair (reason): []  less expensive option to power   wheelchair   []  rim activated power assist -      decreased strength   []  Heavy duty manual wheelchair       K0006     Arm:    []  both []  right  []  left     Foot:   []  both []  right  []  left     []  hemi height required    []  Dependent base  []   user exceeds 250lbs  []  non-functional ambulator    []  extreme spasticity  []  over active movement   []  broken frame/hx of repeated     repairs  []  able to self-propel in residence       []  lower seat to floor height required  []  unable to self-propel in residence   []  Extra heavy duty manual wheelchair  K0007     Arm:    []  both []  right  []  left     Foot:   []  both []  right  []  left     []  hemi height required  []  Dependent  base  []  user exceeds 300lbs  []  non-functional ambulator    []  able to self-propel in residence   []  lower seat to floor height required  []  unable to self-propel in residence     [x]  Manual wheelchair with tilt [phone]      (Manual "Tilt-n-Space")  [x]  patient is dependent for transfers  [x]  patient requires frequent       positioning for pressure relief   [x]  patient requires frequent      positioning for poor/absent trunk control        []  Stroller Base  []  infant/child   []  unable to propel manual      wheelchair  []  allows for growth  []  non-functional ambulator  []  non-functional UE  []  independent mobility is not a goal at this time    MANUAL FRAME OPTIONS      Push handles  []  extended   []  angle adjustable   [x]  standard  [x]  caregiver access  [x]  caregiver assist    []  allows "hooking" to enable      increased ability to perform ADLs or maintain balance   [x]  Angle Adjustable Back  [x]  postural control  [x]  control of tone/spasticity  [x]  accommodation of range of motion  []  UE functional control  [x]  accommodation for seating system    Rear wheel placement  []  std/fixed  [] fully adjustableramputee   []  camber ________degree  [x]  removable rear wheel  []  non-removable rear wheel  Wheel size _20"______  Wheel style____mag___________________  []  improved UE access to wheels  []  increase propulsion ability  []  improved stability  []  changing angle in space for      improvement of postural stability  [x]  remove for transport    []  allow for seating system to fit on  base  []  amputee placement  []  1-arm drive access   r R  r L  []  enable propulsion of manual       wheelchair with one arm    []  amputee placement   Wheel rims/ Hand rims  []  Standard    []  Specialized-____ []  provide ability to propel manual   []  increase self-propulsion with hand wheelchair weakness/decreased grasp     []  Spoke protector/guard   []  prevent hands from getting caught in spokes    Tires:  []  pneumatic  [x]  flat free inserts  []  solid  Style:  [x]  decrease roll resistance              [x]  prevent frequent flats  [x]  increase shock absorbency  [x]  decrease maintenance   []  decrease pain from road shock    []  decrease spasms from road shock    Wheel Locks:    [x]  push []  pull []  scissor  [x]  lock wheels for transfers  [x]  lock wheels from rolling   Brake/wheel lock extension:  [  x] R  [x]  L  [x]  allow user to operate wheel locks due to decreased reach or strength   Caster housing: standard Caster size:                     6x2" Style:                            poly              []  suspension fork  [x]  maneuverability   [x]  stability of wheelchair   [x]  durability  []  maintenance  []  angle adjustment for posture  []  allow for feet to come under        wheelchair base  []  allows change in seat to floor  height   [x]  increase shock absorbency  [x]  decrease pain from road shock  [x]  decrease spasms from road    shock   []  Side guards  []  prevent clothing getting caught in wheel or becoming soiled   [] provide hip and pelvic stability  []  eliminates contact between body and wheels  []  limit hand contact with wheels   [x]  Anti-tippers      [x]  prevent wheelchair from tipping    backward  [x]  assist caregiver with curbs      CHAIR OPTIONS MANUAL & POWER      Armrests   [x]  adjustable height [x]  removable  []  swing away []  fixed  []  flip back  []  reclining  []  full length pads [x]  desk []  tube arms []  gel pads  [x]  provide support with elbow at 90    [x]  remove/flip back/swing away for  transfers  [x]  provide support and positioning of upper body    [x]  allow to come closer to table top  [x]  remove for access to tables  []  provide support for w/c tray  [x]  change of height/angles for variable activities   []  Elbow support / Elbow stop  []  keep elbow positioned on arm pad  []  keep arms from falling off arm pad  during tilt and/or recline   Upper Extremity  Support  []  Arm trough  []   R  []   L  Style:  []  swivel mount []  fixed mount   []  posterior hand support  []   tray  []  full tray  []  joystick cut out  []   R  []   L  Style:  []  decrease gravitational pull on      shoulders  []  provide support to increase UE  function  []  provide hand support in natural    position  []  position flaccid UE  []  decrease subluxation    []  decrease edema       []  manage spasticity   []  provide midline positioning  []  provide work surface  []  placement for AAC/ Computer/ EADL     Hangers/ Legrests   []  ______ degree  []  Elevating []  articulating  [x]  swing away []  fixed [x]  lift off  []  heavy duty  []  adjustable knee angle  []  adjustable calf panel   []  longer extension tube              [x]  provide LE support  [x]  maintain placement of feet on      footplate   []  accommodate lower leg length  []  accommodate to hamstring       tightness  [x]  enable transfers  []  provide change in position for  LE's  []  elevate legs during recline    []  decrease edema  []  durability      Foot support   [x]  footplate [x]  R [x]  L [x]  flip up           []  Depth adjustable   [x]  angle adjustable  []  foot board/one piece    [x]  provide foot support  [x]  accommodate to ankle ROM  []  allow foot to go under wheelchair base  [x]  enable transfers     []  Shoe holders  []  position foot    []  decrease / manage spasticity  []  control position of LE  []  stability    []  safety     [x]  Ankle strap/heel      loops  [x]  support foot on foot support  [x]  decrease extraneous movement  []  provide input to heel   []  protect foot     []  Amputee adapter []  R  []  L     Style:                  Size:  []  Provide support for stump/residual extremity    [x]  Transportation tie-down  [x]  to provide crash tested tie-down brackets    []  Crutch/cane holder    []  O2 holder    []  IV hanger   []  Ventilator tray/mount    []  stabilize accessory on wheelchair       Component  Justification      [x]  Seat cushion     Pharmacist, community  []  accommodate impaired sensation  []  decubitus ulcers present or history  [x]  unable to shift weight  [x]  increase pressure distribution  [x]  prevent pelvic extension  [x]  custom required "off-the-shelf"    seat cushion will not accommodate deformity  [x]  stabilize/promote pelvis alignment  [x]  stabilize/promote femur alignment  [x]  accommodate obliquity  [x]  accommodate multiple deformity  []  incontinent/accidents  []  low maintenance     []  seat mounts                 []  fixed []  removable  []  attach seat platform/cushion to wheelchair frame    []  Seat wedge    []  provide increased aggressiveness of seat shape to decrease sliding  down in the seat  []  accommodate ROM        []  Cover replacement   []  protect back or seat cushion  []  incontinent/accidents    []  Solid seat / insert    []  support cushion to prevent      hammocking  []  allows attachment of cushion to mobility base    []  Lateral pelvic/thigh/hip     support (Guides)     []  decrease abduction  []  accommodate pelvis  []  position upper legs  []  accommodate spasticity  []  removable for transfers     []  Lateral pelvic/thigh      supports mounts  []  fixed   []  swing-away   []  removable  []  mounts lateral pelvic/thigh supports     []  mounts lateral pelvic/thigh supports swing-away or removable for transfers    []  Medial thigh support ([place])  [] decrease adduction  [] accommodate ROM  []  remove for transfers   []  alignment      []  Medial thigh   []  fixed      support mounts      []  swing-away   []  removable  []  mounts medial thigh supports   []  Mounts medial supports swing- away or removable for transfers  Component  Justification   [x]  Back     PRM custom molded   [x]  provide posterior trunk support [x]  facilitate tone  [x]  provide lumbar/sacral support [x]  accommodate deformity  [x]  support trunk in midline   [x]  custom required "off-the-shelf" back  support will not accommodate deformity   [x]  provide lateral trunk support [x]  accommodate or decrease tone            [x]  Back mounts  []  fixed  [x]  removable  [x]  attach back rest/cushion to wheelchair frame   []  Lateral trunk      supports  []  R []  L  []  decrease lateral trunk leaning  []  accommodate asymmetry    []  contour for increased contact  []  safety    []  control of tone    []  Lateral trunk      supports mounts  []  fixed  []  swing-away   []  removable  []  mounts lateral trunk supports     []  Mounts lateral trunk supports swing-away or removable for transfers   [x]  Anterior chest      strap, vest     [x]  decrease forward movement of shoulder  [x]  decrease forward movement of trunk  [x]  safety/stability  [x]  added abdominal support  [x]  trunk alignment  [x]  assistance with shoulder control   [x]  decrease shoulder elevation    [x]  Headrest      [x]  provide posterior head support  []  provide posterior neck support  []  provide lateral head support  []  provide anterior head support  [x]  support during tilt and recline  []  improve feeding     [x]  improve respiration  []  placement of switches  [x]  safety    [x]  accommodate ROM   [x]  accommodate tone  []  improve visual orientation   []  Headrest           []  fixed [x]  removable []  flip down      Mounting hardware   []  swing-away laterals/switches  [x]  mount headrest   [x]  mounts headrest flip down or  removable for transfers  []  mount headrest swing-away laterals   []  mount switches     []  Neck Support    []  decrease neck rotation  []  decrease forward neck flexion   Pelvic Positioner    []  std hip belt          [x]  padded hip belt  []  dual pull hip belt  []  four point hip belt  [x]  stabilize tone  [x]  decrease falling out of chair  [x]  prevent excessive extension  []  special pull angle to control      rotation  [x]  pad for protection over boney   prominence  [x]  promote comfort    []  Essential needs         bag/pouch   []  medicines []  special food rorthotics []  clothing changes  []  diapers  []  catheter/hygiene []  ostomy supplies   The above equipment has a life- long use expectancy.  Growth and changes in medical and/or functional conditions would be the exceptions.   SUMMARY:  Why mobility device was selected; include why a lower level device is not appropriate:    ASSESSMENT:  CLINICAL IMPRESSION: Patient is a 33 y.o. female who was seen today for physical therapy evaluation for a custom manual tilt in space wheelchair. She is a lifelong wheelchair user and requires dependent mobility. She is unable to propel herself in a standard or lightweight wheelchair due to her bilateral upper extremity arthrogryposis.  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 She is unable to shift her weight or reposition herself independently in the wheelchair and so requires the tilt in space feature to allow for pressure relief and ease of repositioning to reduce caregiver burden. She is non-ambulatory given extent of bilateral knee flexion contractures, R ankle dorsiflexion contracture and L ankle plantarflexion contracture.  She requires standard push handles for her wheelchair to allow for her family/caregiver to push her wheelchair. An angle adjustable back will allow for greater postural control and a more appropriate line of sight given her significant cervical extension.  She will require a Programmer, applications in conjunction with a PRM Custom Molded back to accommodate for her significant and unique postural deformities. She has a ~4in lumbar lordosis shifting a majority of her pressure to the spine of her scapulae. Given her inability to shift weight/reposition independently, she is at an  increased risk for developing pressure injuries. And also, due to her poor nutritional status as receiving tube feeds exclusively, she is at greater risk of poor healing from any pressure injuries she does sustain. Her hips are windswept to the left and require seat accommodation for this. Her shoulders are both elevated, but her R shoulder is protracted while her L shoulder is retracted resulting in a relative twist of her thoracic spine toward posterior left. Both shoulders are internally rotated as well. Given her significant cognitive deficits and her status as non-verbal, she is unable to communicate whether or not she is in pain or is experiencing any discomfort. A custom molded seat and back would minimize her risk for developing grievous pressure injuries as well as improve her overall comfort, therefore increasing her sitting tolerance allowing her to participate more in her MRADLs and improve her overall quality of life. The back mounts must be removable for ease of transport.  Her rear wheels should be removable for transportation. They should also have flat free inserts to decrease the overall maintenance of her wheelchair and reduce the roll resistance decreasing the burden to her caregivers while pushing her wheelchair. Push to lock brakes will lock her wheels to allow for safe transfers in and out of her wheelchair. 6x2" casters will improve the maneuverability, stability and durability of her tilt in space wheelchair.  Anti tippers will improve the safety of her wheelchair preventing her from tipping backwards, but will also assist her caregivers with negotiating community obstacles, such as curbs. An anterior chest strap will also significantly increase the safety of the patient seated in her wheelchair and minimize her risk for falling out, especially while she is seated in her wheelchair during transportation. She lacks independent postural control and so is at greater risk for falling out of  her wheelchair. 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 She requires a padded hip belt so that she doesn't develop pressure injuries over any bony prominence, such as her ASIS'.  A headrest will provide posterior support to her head throughout all mobility, but especially while she is tilted backwards during a pressure relief or for repositioning. She presents with significant cervical hyperextension and a headrest would reduce the overall  strain on her neck muscles and other structures. The headrest should be removable for ease of transfers and transportation.  Height adjustable, removable and desk-length arm rests will provide the appropriate upper extremity support while also allowing her to access various work surfaces. Given her unique and fixed upper extremity posture, she would likely benefit from differing height arm rests. Removable arm rests will allow for safe transfers in and out of her wheelchair.  Her leg rests should be swing- away and lift off so that they can be removed for safe transfers in and out of her wheelchair and for ease of transportation. The foot plate should be angle adjustable to accommodate for her differing ankle positions (right dorsiflexed, left plantarflexed). Flip up foot plates will also assist in safe transfers in and out of her wheelchair. An ankle strap will further assist with keeping her feet positioned on her foot plates during all mobility.  Her need for a custom manual tilt in space wheelchair is a life long medical need. She is unable to propel herself in any wheelchair, thus requiring dependent mobility. A custom tilt in space wheelchair will significantly reduce her risk for developing pressure injuries and any grievous sequelae. This wheelchair would also increase the patients sitting tolerance,  which would further improve her lung health- minimizing her risk for developing pneumonia- and improve her overall GI function and motility. Furthermore, this custom tilt in space manual wheelchair would greatly improve the patients overall quality of life.    OBJECTIVE IMPAIRMENTS Abnormal gait, decreased activity tolerance, decreased balance, decreased cognition, decreased coordination, decreased endurance, decreased mobility, difficulty walking, decreased ROM, decreased strength, increased muscle spasms, impaired flexibility, impaired tone, impaired UE functional use, impaired vision/preception, and postural dysfunction.   ACTIVITY LIMITATIONS transfers, bed mobility, hygiene/grooming, and locomotion level  PARTICIPATION LIMITATIONS: meal prep, community activity, occupation, and school  PERSONAL FACTORS Past/current experiences, Social background, Time since onset of injury/illness/exacerbation, and Transportation are also affecting patient's functional outcome.   REHAB POTENTIAL: Good  CLINICAL DECISION MAKING: Stable/uncomplicated  EVALUATION COMPLEXITY: High                                   GOALS: One time visit. No goals established.    PLAN: PT FREQUENCY: one time visit    Westley Foots, PT Westley Foots, PT, DPT, CBIS  02/20/2024, 3:38 PM    I concur with the above findings and recommendations of the therapist:  Physician name printed:         Physician's signature:      Date:

## 2024-03-04 ENCOUNTER — Ambulatory Visit (INDEPENDENT_AMBULATORY_CARE_PROVIDER_SITE_OTHER): Payer: MEDICAID | Admitting: Allergy

## 2024-03-04 ENCOUNTER — Encounter: Payer: Self-pay | Admitting: Allergy

## 2024-03-04 VITALS — BP 110/68 | HR 85 | Temp 98.7°F | Ht 59.0 in | Wt 105.0 lb

## 2024-03-04 DIAGNOSIS — H1011 Acute atopic conjunctivitis, right eye: Secondary | ICD-10-CM | POA: Diagnosis not present

## 2024-03-04 DIAGNOSIS — J3089 Other allergic rhinitis: Secondary | ICD-10-CM | POA: Diagnosis not present

## 2024-03-04 DIAGNOSIS — J302 Other seasonal allergic rhinitis: Secondary | ICD-10-CM | POA: Diagnosis not present

## 2024-03-04 MED ORDER — LEVOCETIRIZINE DIHYDROCHLORIDE 5 MG PO TABS: 5.0000 mg | ORAL_TABLET | Freq: Every evening | ORAL | 5 refills | Status: DC

## 2024-03-04 MED ORDER — CROMOLYN SODIUM 4 % OP SOLN: 1.0000 [drp] | Freq: Four times a day (QID) | OPHTHALMIC | 5 refills | Status: DC | PRN

## 2024-03-04 NOTE — Progress Notes (Signed)
 Follow-up Note  RE: CHRISOULA ZEGARRA MRN: [id number] DOB: Sep 18, 1991 Date of Office Visit: 03/04/2024   History of present illness: Alyssa Wong is a 33 y.o. female presenting today for acute visit.  She has back history of allergic rhinitis with conjunctivitis, eczema.  He was last seen in the office on 11/12/2023 by Dr. Allena Katz.  She presents today with her mother. Discussed the use of AI scribe software for clinical note transcription with the patient, who gave verbal consent to proceed.  She has redness and puffiness of the eyelids, which mother noted this morning while preparing for her day program. The symptoms are localized to one eyelid, specifically the inside of the lid. No increased tearing, crusting, or nasal symptoms are present. There is no history of exposure to individuals with conjunctivitis, and no recent increase in outdoor exposure, although pollen levels are high. She has contractures in both arms, preventing her from scratching her eyes, but she has a habit of rubbing her head against her bedding which mother thinks she might be doing to help alleviate the itch and also feels this may also be contributing to the redness and irritation.  She uses ketotifen eyedrops, typically administering them once in the morning and again once in the evening if symptoms are severe. She applied the eye drops this morning, which seemed to help, although the eye remains red. A warm compress was also applied once while she was in the shower. Her current medication regimen includes cetirizine, which she takes daily, and she has previously used [place], although insurance coverage issues have led to changes in her medication. She obtains her eye drops over the counter, sometimes purchasing them from [place] due to cost considerations.  She also states that cetirizine now is costing $6.    Review of systems: 10pt ROS negative unless noted above in HPI   Past medical/social/surgical/family history  have been reviewed and are unchanged unless specifically indicated below.  No changes  Medication List: Current Outpatient Medications  Medication Sig Dispense Refill   cromolyn (OPTICROM) 4 % ophthalmic solution Place 1 drop into both eyes 4 (four) times daily as needed (itchy/watery eyes). 10 mL 5   fluticasone (FLONASE) 50 MCG/ACT nasal spray Place 1 spray into both nostrils daily. 16 g 11   glycopyrrolate (ROBINUL) 2 MG tablet TAKE 1 AND 1/2 TABLETS(3 MG) BY MOUTH TWICE DAILY 270 tablet 3   hydrocortisone 2.5 % cream Apply twice daily for flare ups above neck, maximum 7 days. 30 g 5   ketotifen (ZADITOR) 0.035 % ophthalmic solution Apply 1 drop to eye daily as needed (itchy watery eyes). 10 mL 5   lactulose (CHRONULAC) 10 GM/15ML solution Take 10ml by tube 3 times per day to produce 2 or 3 soft stools per day 946 mL 5   lamoTRIgine (LAMICTAL) 25 MG tablet Place 3 tablets (75 mg total) into feeding tube 2 (two) times daily. 540 tablet 3   levETIRAcetam (KEPPRA) 100 MG/ML solution PLACE 10 ML IN TO FEEDING TUBE TWICE DAILY 1800 mL 3   levOCARNitine (CARNITOR) 1 GM/10ML solution TAKE 5 ML VIA FEEDING TUBE TWICE DAILY 900 mL 5   levocetirizine (XYZAL) 5 MG tablet Take 1 tablet (5 mg total) by mouth every evening. 30 tablet 5   levonorgestrel-ethinyl estradiol (SEASONALE) 0.15-0.03 MG tablet Take 1 tablet by mouth daily.     triamcinolone ointment (KENALOG) 0.1 % Apply twice daily for flare ups below neck, maximum 10 days. 80 g 5  loratadine (CLARITIN) 10 MG tablet Take 10 mg by mouth daily as needed. (Patient not taking: Reported on 03/04/2024)     No current facility-administered medications for this visit.     Known medication allergies: Allergies  Allergen Reactions   Valproic Acid And Related Other (See Comments)     Physical examination: Blood pressure 110/68, pulse 85, temperature 98.7 F (37.1 C), temperature source Temporal, height 4\' 11"  (1.499 m), weight 105 lb (47.6 kg),  SpO2 92%.  General: Alert, interactive, in no acute distress. HEENT: right conjunctival injection, mild lid edema, PERRLA Neck: Supple without lymphadenopathy. Lungs: Clear to auscultation without wheezing, rhonchi or rales. {no increased work of breathing. CV: Normal S1, S2 without murmurs. Abdomen: Nondistended, nontender. Skin: Warm and dry, without lesions or rashes. Extremities:  No clubbing, cyanosis or edema. Neuro:   Grossly intact.  Diagnositics/Labs: None today  Assessment and plan: Allergic Rhinitis with conjunctivitis: - sIgE 02/2023: positive for trees, grasses, weeds, cat, dog - Use saline spray with suction if needed prior to Flonase.   - Use Flonase (Fluticasone) 1 spray each nostril daily. Aim upward and outward. - Change Zyrtec to Xyzal 5mg  tab daily.  This is on insurance formulary.     Hold Zyrtec (Cetirizine) while using Xyzal - For eyes, use Zaditor/Ketotifen 1 eye drop daily as needed for itchy, watery eyes.   Can use Cromolyn 1 drop each eye up to 4 times a day as needed for itchy/watery eyes  Eczema:  - Do a daily soaking tub bath in warm water for 10-15 minutes.  - Use a gentle, unscented cleanser at the end of the bath (such as Dove unscented bar or baby wash, or Aveeno sensitive body wash). Then rinse, pat half-way dry, and apply a gentle, unscented moisturizer cream or ointment (Cerave, Cetaphil, Eucerin, Aveeno)  all over while still damp. Dry skin makes the itching worse. The skin should be moisturized with a gentle, unscented moisturizer at least twice daily.  - Use only unscented liquid laundry detergent. - Apply prescribed topical steroid (triamcinolone 0.1% below neck or for severe flare ups or hydrocortisone 2.5% above neck or for mild flare ups) to flared areas (red and thickened eczema) after the moisturizer has soaked into the skin (wait at least 30 minutes). Taper off the topical steroids as the skin improves. Do not use topical steroid for more  than 7-10 days at a time.   Keep follow-up appointment  I appreciate the opportunity to take part in Cassie's care. Please do not hesitate to contact me with questions.  Sincerely,   Margo Aye, MD Allergy/Immunology [facility]

## 2024-03-04 NOTE — Patient Instructions (Addendum)
 Allergic Rhinitis with conjunctivitis: - sIgE 02/2023: positive for trees, grasses, weeds, cat, dog - Use saline spray with suction if needed prior to Flonase.   - Use Flonase (Fluticasone) 1 spray each nostril daily. Aim upward and outward. - Change Zyrtec to Xyzal 5mg  tab daily.  This is on insurance formulary.     Hold Zyrtec (Cetirizine) while using Xyzal - For eyes, use Zaditor/Ketotifen 1 eye drop daily as needed for itchy, watery eyes.   Can use Cromolyn 1 drop each eye up to 4 times a day as needed for itchy/watery eyes  Eczema:  - Do a daily soaking tub bath in warm water for 10-15 minutes.  - Use a gentle, unscented cleanser at the end of the bath (such as Dove unscented bar or baby wash, or Aveeno sensitive body wash). Then rinse, pat half-way dry, and apply a gentle, unscented moisturizer cream or ointment (Cerave, Cetaphil, Eucerin, Aveeno)  all over while still damp. Dry skin makes the itching worse. The skin should be moisturized with a gentle, unscented moisturizer at least twice daily.  - Use only unscented liquid laundry detergent. - Apply prescribed topical steroid (triamcinolone 0.1% below neck or for severe flare ups or hydrocortisone 2.5% above neck or for mild flare ups) to flared areas (red and thickened eczema) after the moisturizer has soaked into the skin (wait at least 30 minutes). Taper off the topical steroids as the skin improves. Do not use topical steroid for more than 7-10 days at a time.   Keep follow-up appointment

## 2024-03-08 ENCOUNTER — Telehealth (INDEPENDENT_AMBULATORY_CARE_PROVIDER_SITE_OTHER): Payer: Self-pay | Admitting: Family

## 2024-03-08 NOTE — Telephone Encounter (Signed)
  Name of who is calling: melanie / cynthia   Caller's Relationship to Patient: [employer]   Best contact number: [phone]   Provider they see: [facility]   Reason for call: paperwork faxed on 04/08 regarding manually wheelchair. Wanting to know if it was received      PRESCRIPTION REFILL ONLY  Name of prescription:  Pharmacy:

## 2024-03-08 NOTE — Telephone Encounter (Signed)
 LVM informing them that we have not received these forms, provided them with our fax number.   SS, CCMA

## 2024-03-10 ENCOUNTER — Telehealth (INDEPENDENT_AMBULATORY_CARE_PROVIDER_SITE_OTHER): Payer: Self-pay | Admitting: Family

## 2024-03-10 NOTE — Telephone Encounter (Signed)
 Contacted patients mother to inform her that the order was sent in Feb.   Mom stated that she would give [employer] a call and reach back out if need be,   SS, CCMA

## 2024-03-10 NOTE — Telephone Encounter (Signed)
  Name of who is calling: lisa  Caller's Relationship to Patient:mother  Best contact number:[phone]  Provider they BJY:[id number]   Reason for call: Yania in process of getting new wheelchair [employer] is waiting on doctors order to be sent to them.      PRESCRIPTION REFILL ONLY  Name of prescription:  Pharmacy:

## 2024-04-14 ENCOUNTER — Other Ambulatory Visit: Payer: Self-pay

## 2024-04-14 MED ORDER — FLUTICASONE PROPIONATE 50 MCG/ACT NA SUSP: 1.0000 | Freq: Every day | NASAL | 11 refills | Status: DC

## 2024-06-30 ENCOUNTER — Ambulatory Visit (INDEPENDENT_AMBULATORY_CARE_PROVIDER_SITE_OTHER): Payer: MEDICAID | Admitting: Family

## 2024-07-04 NOTE — Progress Notes (Unsigned)
 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 She is taking and tolerating Lamotrigine  and Levetiracetam  for seziures. She has dysphagia and requires feedings by g-tube, with Osmolyte as her formula. She has had recurrent episodes of elevated ammonia levels requiring hospitalization. The initial increase in ammonia was thought to be related to Depakote but she has had further elevations in ammonia after the Depakote was discontinued in early May 2021.  She sees an allergist for seasonal allergies.   Today's concerns: Mom reports that Toree has remained seizure free since her last visit She attends a day program and enjoys the activities there. Although she has a new wheelchair, she tires easily and is ready to lie down in bed when the day program ends each day Mom notes that she has been approved for bathroom modification and she is hopeful that will occur soon Alyssa Wong has been otherwise generally healthy since she was last seen. No health concerns today other than previously mentioned.  Review of systems: Please see HPI for neurologic and other pertinent review of systems. Otherwise all other systems were reviewed and were negative.  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)     Priority: High   Crouzon syndrome     Priority: High    Elevated liver enzymes 01/01/2024   Ineffective airway clearance 01/08/2023   Nasal congestion 01/08/2023   Mold suspected exposure 01/08/2023   Seasonal allergies 03/04/2022   Conjunctivitis of left eye 03/04/2022   Excessive protrusion of tongue 04/19/2021   AKI (acute kidney injury) (HCC) 04/11/2021   Seizure (HCC) 03/28/2021   Metabolic encephalopathy 03/27/2020   SIRS (systemic inflammatory response syndrome) (HCC) 03/26/2020   Altered mental status 03/26/2020   Hypertensive urgency 03/26/2020   Status post insertion of percutaneous endoscopic gastrostomy (PEG) tube (HCC) 03/26/2020   Hyperammonemia (HCC) 03/26/2020   Lactic acidosis 03/26/2020   Loss of weight 07/17/2018   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   Drooling 03/04/2017   Encounter for long-term (current) use of high-risk medication 12/29/2013   Intellectual delay    S/P VP shunt    Arthrogryposis multiplex congenita    Contractures involving both knees    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   Intellectual disability 05/25/2012     Past Medical History:  Diagnosis Date   Arthrogryposis multiplex congenita    Contractures involving both knees    Crouzon syndrome    Intellectual delay    Seizures (HCC)    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 Had a tracheostomy as a child.   Surgical history: Past Surgical History:  Procedure Laterality Date   ELBOW SURGERY Left  Release of contractures   GASTROSTOMY TUBE PLACEMENT     HIP SURGERY       Family history: family history includes Allergic rhinitis in her maternal aunt and maternal uncle; Kidney disease in  her maternal grandfather.   Social history: Social History   Socioeconomic History   Marital status: Single    Spouse name: Not on file   Number of children: Not on file   Years of education: Not on file   Highest education level: Not on file  Occupational History   Not on file  Tobacco Use   Smoking status: Never    Passive exposure: Never   Smokeless tobacco: Never  Vaping Use   Vaping status: Never Used  Substance and Sexual Activity   Alcohol use: No   Drug use: No   Sexual activity: Never  Other Topics Concern   Not on file  Social History Narrative   Anabelen is a young woman who graduated from [employer] with a certificate in 2013. She is enrolled in the day programs [facility] [employer] [facility] [employer]. She lives with her mother    Social Drivers of Corporate investment banker Strain: Not on file  Food Insecurity: Not on file  Transportation Needs: Not on file  Physical Activity: Not on file  Stress: Not on file  Social Connections: Not on file  Intimate Partner Violence: Not on file    Past/failed meds: Copied from previous record: Depakote - elevated ammonia and altered mental status (d/c'd in May 2021)   Allergies: Allergies  Allergen Reactions   Valproic  Acid And Related Other (See Comments)    Immunizations: Immunization History  Administered Date(s) Administered   Hep A / Hep B 10/31/2021, 12/03/2021, 06/03/2022    Diagnostics/Screenings: Copied from previous record: 03/26/2020 - CT head wo contrast - 1. Unchanged position of right parietal approach shunt catheter with unchanged size and configuration of the ventricles. 2. No acute intracranial abnormality.   07/05/2006 - CT head wo contrast - Study technically limited to patient positioning and inability to straighten head. Right ventricular drain is in place without hydrocephalus. No definite acute intracranial abnormality   03/28/2020 - rEEG - This study showed evidence of  epileptogenicity as well as cortical dysfunction in the right temporoparietal region consistent with underlying craniotomy. Additionally, there is evidence of mild to moderate diffuse encephalopathy, nonspecific etiology but likely related to patient's history of cerebral palsy and intellectual delay. No seizures were seen throughout the recording.  Physical Exam: Vital signs were obtained but not recorded  General: short statured but well developed, well nourished woman, seated in wheelchair, in no evident distress Head: doliocephalic with exothalmus, ocular hypertelorism and midface hypoplasia, tongue protrusion. Oropharynx difficult to examine but appears benign. Neck: supple Cardiovascular: regular rate and rhythm, no murmurs. Respiratory: clear to auscultation bilaterally Abdomen: bowel sounds present all four quadrants, abdomen soft, non-tender, non-distended. No hepatosplenomegaly or masses palpated.Gastrostomy tube in place, site clean and dry Musculoskeletal: neuromuscular scoliosis. Back is arched, has spastic quadriparesis with arthrogryposis in the right upper extremity Skin: no rashes or neurocutaneous lesions  Neurologic Exam Mental Status: awake and fully alert. Has no language. Unable to follow instructions or participate in examination Cranial Nerves: fundoscopic exam - red reflex present.  Unable to fully visualize fundus.  Pupils equal briskly reactive to light. Does not turn to localize faces and objects in the periphery. Turns to localize sounds in the periphery. Facial movements are asymmetric, has lower facial weakness  with drooling.  Motor: spastic quadriparesis, poor fine motor movements Sensory: withdrawal x 4 Coordination: unable to adequately assess due to patient's inability to participate in examination. Dysmetria with reach for objects. Gait and Station: unable to stand and bear weight.  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) - Plan: glycopyrrolate  (ROBINUL ) 2 MG tablet  Drooling - Plan: glycopyrrolate  (ROBINUL ) 2 MG tablet  Gastrostomy tube dependent (HCC) - Plan: lactulose  (CHRONULAC ) 10 GM/15ML solution, levOCARNitine  (CARNITOR ) 1 GM/10ML solution  Metabolic encephalopathy - Plan: lactulose  (CHRONULAC ) 10 GM/15ML solution, levOCARNitine  (CARNITOR ) 1 GM/10ML solution  Hyperammonemia (HCC) - Plan: lactulose  (CHRONULAC ) 10 GM/15ML solution, levOCARNitine  (CARNITOR ) 1 GM/10ML solution  Elevated liver enzymes - Plan: lactulose  (CHRONULAC ) 10 GM/15ML solution, levOCARNitine  (CARNITOR ) 1 GM/10ML solution  Generalized convulsive epilepsy (HCC) - Plan: lamoTRIgine  (LAMICTAL ) 25 MG tablet, levETIRAcetam  (KEPPRA ) 100 MG/ML solution   Recommendations for plan of care: The patient's previous Epic records were reviewed. No recent diagnostic studies to be reviewed with the patient. Mom requested 90 day supplies for her medications which I will do Plan until next visit: Continue medications as prescribed  Call for questions or concerns Return in about 7 months (around 02/02/2025).  The medication list was reviewed and reconciled. No changes were made in the prescribed medications today. A complete medication list was provided to the patient.  Allergies as of 07/05/2024       Reactions   Valproic  Acid And Related Other (See Comments)        Medication List        Accurate as of July 04, 2024  1:30 PM. If you have any questions, ask your nurse or doctor.          cromolyn  4 % ophthalmic solution Commonly known as: OPTICROM  Place 1 drop into both eyes 4 (four) times daily as needed (itchy/watery eyes).   fluticasone  50 MCG/ACT nasal spray Commonly known as: FLONASE  Place 1 spray into both nostrils daily.   glycopyrrolate  2 MG tablet Commonly known as: ROBINUL  TAKE 1 AND 1/2 TABLETS(3 MG) BY  MOUTH TWICE DAILY   hydrocortisone  2.5 % cream Apply twice daily for flare ups above neck, maximum 7 days.   ketotifen  0.035 % ophthalmic solution Commonly known as: ZADITOR  Apply 1 drop to eye daily as needed (itchy watery eyes).   lactulose  10 GM/15ML solution Commonly known as: CHRONULAC  Take 10ml by tube 3 times per day to produce 2 or 3 soft stools per day   lamoTRIgine  25 MG tablet Commonly known as: LAMICTAL  Place 3 tablets (75 mg total) into feeding tube 2 (two) times daily.   levETIRAcetam  100 MG/ML solution Commonly known as: KEPPRA  PLACE 10 ML IN TO FEEDING TUBE TWICE DAILY   levOCARNitine  1 GM/10ML solution Commonly known as: CARNITOR  TAKE 5 ML VIA FEEDING TUBE TWICE DAILY   levocetirizine 5 MG tablet Commonly known as: XYZAL  Take 1 tablet (5 mg total) by mouth every evening.   levonorgestrel-ethinyl estradiol 0.15-0.03 MG tablet Commonly known as: SEASONALE Take 1 tablet by mouth daily.   loratadine 10 MG tablet Commonly known as: CLARITIN Take 10 mg by mouth daily as needed.   triamcinolone  ointment 0.1 % Commonly known as: KENALOG  Apply twice daily for flare ups below neck, maximum 10 days.      Total time spent with the patient was 25 minutes, of which 50% or more was spent in counseling and coordination of  care.  Ellouise Bollman NP-C [facility] [address] [place], [place] [address] Ph. [phone] Fax [phone]

## 2024-07-05 ENCOUNTER — Ambulatory Visit (INDEPENDENT_AMBULATORY_CARE_PROVIDER_SITE_OTHER): Payer: MEDICAID | Admitting: Family

## 2024-07-05 ENCOUNTER — Encounter (INDEPENDENT_AMBULATORY_CARE_PROVIDER_SITE_OTHER): Payer: Self-pay | Admitting: Family

## 2024-07-05 DIAGNOSIS — Z931 Gastrostomy status: Secondary | ICD-10-CM

## 2024-07-05 DIAGNOSIS — K117 Disturbances of salivary secretion: Secondary | ICD-10-CM

## 2024-07-05 DIAGNOSIS — G800 Spastic quadriplegic cerebral palsy: Secondary | ICD-10-CM

## 2024-07-05 DIAGNOSIS — R748 Abnormal levels of other serum enzymes: Secondary | ICD-10-CM

## 2024-07-05 DIAGNOSIS — E7220 Disorder of urea cycle metabolism, unspecified: Secondary | ICD-10-CM

## 2024-07-05 DIAGNOSIS — Q751 Craniofacial dysostosis: Secondary | ICD-10-CM | POA: Diagnosis not present

## 2024-07-05 DIAGNOSIS — G40309 Generalized idiopathic epilepsy and epileptic syndromes, not intractable, without status epilepticus: Secondary | ICD-10-CM

## 2024-07-05 DIAGNOSIS — G9341 Metabolic encephalopathy: Secondary | ICD-10-CM

## 2024-07-05 MED ORDER — LEVETIRACETAM 100 MG/ML PO SOLN
ORAL | 3 refills | Status: AC
Start: 2024-07-05 — End: ?

## 2024-07-05 MED ORDER — LEVOCARNITINE 1 GM/10ML PO SOLN: ORAL | 5 refills | Status: AC

## 2024-07-05 MED ORDER — LACTULOSE 10 GM/15ML PO SOLN: ORAL | 5 refills | Status: AC

## 2024-07-05 MED ORDER — GLYCOPYRROLATE 2 MG PO TABS
ORAL_TABLET | ORAL | 3 refills | Status: AC
Start: 2024-07-05 — End: ?

## 2024-07-05 MED ORDER — LAMOTRIGINE 25 MG PO TABS: 75.0000 mg | ORAL_TABLET | Freq: Two times a day (BID) | ORAL | 3 refills | Status: AC

## 2024-07-05 NOTE — Patient Instructions (Signed)
 It was a pleasure to see you today!  Instructions for you until your next appointment are as follows: Continue giving [facility] medications as prescribed I sent in refills to be 90 day supplies for each medication Please sign up for MyChart if you have not done so. Please plan to return for follow up in March 2026 or sooner if needed.  Feel free to contact our office during normal business hours at [phone] with questions or concerns. If there is no answer or the call is outside business hours, please leave a message and our clinic staff will call you back within the next business day.  If you have an urgent concern, please stay on the line for our after-hours answering service and ask for the on-call neurologist.     I also encourage you to use MyChart to communicate with me more directly. If you have not yet signed up for MyChart within [facility], the front desk staff can help you. However, please note that this inbox is NOT monitored on nights or weekends, and response can take up to 2 business days.  Urgent matters should be discussed with the on-call pediatric neurologist.   At [facility], we are committed to providing exceptional care. You will receive a patient satisfaction survey through text or email regarding your visit today. Your opinion is important to me. Comments are appreciated.

## 2024-07-07 ENCOUNTER — Encounter (INDEPENDENT_AMBULATORY_CARE_PROVIDER_SITE_OTHER): Payer: Self-pay | Admitting: Family

## 2024-08-23 ENCOUNTER — Other Ambulatory Visit (INDEPENDENT_AMBULATORY_CARE_PROVIDER_SITE_OTHER): Payer: Self-pay | Admitting: Family

## 2024-08-23 DIAGNOSIS — G9341 Metabolic encephalopathy: Secondary | ICD-10-CM

## 2024-08-23 DIAGNOSIS — R748 Abnormal levels of other serum enzymes: Secondary | ICD-10-CM

## 2024-08-23 DIAGNOSIS — Z931 Gastrostomy status: Secondary | ICD-10-CM

## 2024-08-23 DIAGNOSIS — E7220 Disorder of urea cycle metabolism, unspecified: Secondary | ICD-10-CM

## 2024-08-23 DIAGNOSIS — Q751 Craniofacial dysostosis: Secondary | ICD-10-CM

## 2024-08-31 ENCOUNTER — Telehealth (INDEPENDENT_AMBULATORY_CARE_PROVIDER_SITE_OTHER): Payer: Self-pay | Admitting: Family

## 2024-08-31 NOTE — Telephone Encounter (Signed)
 Mom called stating the GT Tube Connector is no longer being covered by medicaid. Mom verbalized she tried with the insurance company closer to the end of September and that was their response.  Also, mom verbalized [facility] in [place] will not refill the patients levocarnitine , the medication to help with the patients liver.  Advised mom it states 5 refills remaining but I will send a message to Ellouise Bollman about the refills also.  Please f/u with mom

## 2024-09-01 NOTE — Telephone Encounter (Signed)
 Contacted patients pharmacy to inquire about the prescription.   Pharmacist stated that they had the medication ready in September but it was never picked up. She stated that she would get the prescription ready again. Confirmed that there were no issues with insurance.   SS, CCMA

## 2024-09-28 ENCOUNTER — Telehealth (INDEPENDENT_AMBULATORY_CARE_PROVIDER_SITE_OTHER): Payer: Self-pay | Admitting: Family

## 2024-09-28 NOTE — Telephone Encounter (Signed)
 Contacted patients mother.  Verified patients name and DOB as well as mothers name.   I relayed the message from provider to mom.  Mom stated that she wonders who Mrs. Ellouise was in contact with because she was just informed before calling us  that [employer] was needing an Authorization.   I informed mom that that was correct, [employer] is currently working on obtaining an authorization from insurance. Mom then stated that she was in communication with insurance who informed her of the need of Authorization with our office.   I offered the samples once again to mom who stated that she will call insurance again to gain better clarity on what is needed and from whom. She will then call back.   I verbalized understanding.   SS, CCMA

## 2024-09-28 NOTE — Telephone Encounter (Signed)
 New message   Pt c/o medication issue:  1. Name of Medication: Mom voiced - [employer] tube feeding  low on supplies   2. How are you currently taking this medication (dosage and times per day)? Tube feeding   3. Are you having a reaction (difficulty breathing--STAT)? No   4. What is your medication issue? Needs  Prior authorization - [facility]   phone number [phone]

## 2024-09-28 NOTE — Telephone Encounter (Signed)
 I called [employer]. They are working on Prior Authorization through her insurance. They do not need a new order at this time.   Please let Mom know that we can give her samples of Mallie Pinion if needed while [employer] is working through this issue.  Thanks,  Ellouise

## 2024-09-28 NOTE — Telephone Encounter (Signed)
 Contacted patients mother.  Verified patients name and DOB as well as mothers name.   Mom stated that Shamela is in need of a new order for her Mallie Master formula.   SS, CCMA

## 2024-09-29 NOTE — Telephone Encounter (Signed)
 Mom called back and stated that she would like to move forward with the samples, she asked how to go about receiving the samples.   I informed mom of the samples being placed at the front desk as well as our hours of operation.   Mom verbalized understanding and stated that she would retreive the samples before the end of business today.  SS, CCMA

## 2024-10-23 ENCOUNTER — Other Ambulatory Visit (INDEPENDENT_AMBULATORY_CARE_PROVIDER_SITE_OTHER): Payer: Self-pay | Admitting: Family

## 2024-10-23 DIAGNOSIS — G40309 Generalized idiopathic epilepsy and epileptic syndromes, not intractable, without status epilepticus: Secondary | ICD-10-CM

## 2024-11-03 ENCOUNTER — Other Ambulatory Visit: Payer: Self-pay

## 2024-11-03 ENCOUNTER — Ambulatory Visit: Payer: MEDICAID | Admitting: Internal Medicine

## 2024-11-03 ENCOUNTER — Encounter: Payer: Self-pay | Admitting: Internal Medicine

## 2024-11-03 VITALS — Temp 98.7°F

## 2024-11-03 DIAGNOSIS — J302 Other seasonal allergic rhinitis: Secondary | ICD-10-CM | POA: Diagnosis not present

## 2024-11-03 DIAGNOSIS — J3089 Other allergic rhinitis: Secondary | ICD-10-CM

## 2024-11-03 DIAGNOSIS — L2089 Other atopic dermatitis: Secondary | ICD-10-CM

## 2024-11-03 MED ORDER — CROMOLYN SODIUM 4 % OP SOLN: 1.0000 [drp] | Freq: Four times a day (QID) | OPHTHALMIC | 5 refills | Status: AC | PRN

## 2024-11-03 MED ORDER — HYDROCORTISONE 2.5 % EX CREA: TOPICAL_CREAM | CUTANEOUS | 5 refills | Status: AC

## 2024-11-03 MED ORDER — FLUTICASONE PROPIONATE 50 MCG/ACT NA SUSP: 1.0000 | Freq: Every day | NASAL | 5 refills | Status: AC

## 2024-11-03 MED ORDER — TRIAMCINOLONE ACETONIDE 0.1 % EX OINT: TOPICAL_OINTMENT | CUTANEOUS | 5 refills | Status: AC

## 2024-11-03 MED ORDER — LEVOCETIRIZINE DIHYDROCHLORIDE 5 MG PO TABS: 5.0000 mg | ORAL_TABLET | Freq: Every evening | ORAL | 5 refills | Status: DC

## 2024-11-03 NOTE — Patient Instructions (Addendum)
 Allergic Rhinoconjunctivitis:  - sIgE 02/2023: positive for trees, grasses, weeds, cat, dog - Use saline spray with suction if needed prior to Flonase .   - Use Flonase  (Fluticasone ) 1 spray each nostril daily. Aim upward and outward. - Use Xyzal  5mg  daily.   - For eyes, use Ketotifen /Olopatadine  1 eye drop daily as needed or Cromolyn  1 eye drop 4 times a day as needed for itchy, red, watery eyes.    Eczema:  - Do a daily soaking tub bath in warm water  for 10-15 minutes.  - Use a gentle, unscented cleanser at the end of the bath (such as Dove unscented bar or baby wash, or Aveeno sensitive body wash). Then rinse, pat half-way dry, and apply a gentle, unscented moisturizer cream or ointment (Cerave, Cetaphil, Eucerin, Aveeno)  all over while still damp. Dry skin makes the itching worse. The skin should be moisturized with a gentle, unscented moisturizer at least twice daily.  - Use only unscented liquid laundry detergent. - Apply prescribed topical steroid (triamcinolone  0.1% below neck or for severe flare ups or hydrocortisone  2.5% above neck or for mild flare ups) to flared areas (red and thickened eczema) after the moisturizer has soaked into the skin (wait at least 30 minutes). Taper off the topical steroids as the skin improves. Do not use topical steroid for more than 7-10 days at a time.

## 2024-11-03 NOTE — Progress Notes (Signed)
 FOLLOW UP Date of Service/Encounter:  11/03/24   Subjective:  Alyssa Wong (DOB: February 11, 1991) is a 33 y.o. female [facility] for follow up for allergic rhinitis and eczema.   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  History obtained from: chart review and patient and mother. Last visit was with Dr Jeneal and at the time, discussed switching form Zyrtec  to Xyzal  and Cromolyn  eye drops PRN or uncontrolled rhinoconjunctivitis. Eczema controlled on topical steroids PRN.   Doing well since last visit. Not much issues with congestion, runny nose, sneezing, itchy watery eyes.  Mom uses Flonase  and Xyzal  daily and that is helping control her symptoms; if she stops using them then she does note increased congestion and drainage.  Eyes are doing well without watery/redness; switches between Ketotifen , Olopatadine , Cromolyn .   Eczema is doing well also.  Denies much issues with flare ups except few times a month requiring topical steroids.  Does moisturize her skin regularly but still with some dry skin.   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 Physical Exam: GEN: alert, well developed HEENT: clear conjunctiva, nose with slight rhinorrhea  HEART: regular rate and rhythm LUNGS: clear to auscultation bilaterally, no coughing, unlabored respiration SKIN: no eczematous patches but does have dry skin on extremities    Assessment:   1. Seasonal and perennial allergic rhinitis   2. Other atopic dermatitis     Plan/Recommendations:  Allergic Rhinoconjunctivitis:  - Improved  - sIgE 02/2023: positive for trees, grasses, weeds, cat, dog - Use saline spray with suction if needed prior to Flonase .   - Use Flonase  (Fluticasone ) 1 spray each nostril daily. Aim upward and outward. - Use Xyzal  5mg  daily.   - For eyes, use Ketotifen /Olopatadine  1 eye drop daily as needed or Cromolyn  1 eye drop 4 times a day as needed for itchy, red, watery eyes.    Eczema:  - Controlled  - Do a daily soaking tub bath in warm water  for 10-15 minutes.  - Use a gentle, unscented cleanser at the end of the bath (such as Dove unscented bar or baby wash, or Aveeno sensitive body wash). Then rinse, pat half-way dry, and apply a gentle, unscented moisturizer cream or ointment (Cerave, Cetaphil, Eucerin, Aveeno)  all over while still damp. Dry skin makes the itching worse. The skin should be moisturized with a gentle, unscented moisturizer at least twice daily.  - Use only unscented liquid laundry detergent. - Apply prescribed topical steroid (triamcinolone  0.1% below neck or for severe flare ups or hydrocortisone  2.5% above neck or for mild flare ups) to flared areas (red and thickened eczema) after the moisturizer has soaked into the skin (wait at least 30 minutes). Taper off the topical steroids as the skin improves. Do not use topical steroid for more than 7-10 days at a time.       Return in about 6 months (around 05/04/2025).  Arleta Blanch, MD [facility] 

## 2024-11-08 ENCOUNTER — Telehealth (INDEPENDENT_AMBULATORY_CARE_PROVIDER_SITE_OTHER): Payer: Self-pay | Admitting: Family

## 2024-11-08 NOTE — Telephone Encounter (Signed)
 Debra with [employer]  [facility] called to confirm that the patient is current and to inform us  that they will be sending over incontinence supply renewal forms. The patient will be out of supplies shortly and is requesting expedited processing.

## 2024-12-24 ENCOUNTER — Other Ambulatory Visit: Payer: Self-pay | Admitting: *Deleted

## 2024-12-24 MED ORDER — LEVOCETIRIZINE DIHYDROCHLORIDE 5 MG PO TABS: 5.0000 mg | ORAL_TABLET | Freq: Every day | ORAL | 1 refills | Status: AC

## 2025-02-09 ENCOUNTER — Ambulatory Visit (INDEPENDENT_AMBULATORY_CARE_PROVIDER_SITE_OTHER): Payer: MEDICAID | Admitting: Family

## 2025-05-04 ENCOUNTER — Ambulatory Visit: Payer: MEDICAID | Admitting: Allergy
